# Patient Record
Sex: Male | Born: 1950 | Race: Black or African American | Hispanic: No | Smoking: Current every day smoker
Health system: Southern US, Community
[De-identification: ages and names within clinical notes are randomized; demographics above are authoritative.]

## PROBLEM LIST (undated history)

## (undated) DIAGNOSIS — F29 Unspecified psychosis not due to a substance or known physiological condition: Secondary | ICD-10-CM

## (undated) DIAGNOSIS — F0390 Unspecified dementia without behavioral disturbance: Secondary | ICD-10-CM

## (undated) DIAGNOSIS — G20A1 Parkinson's disease without dyskinesia, without mention of fluctuations: Secondary | ICD-10-CM

## (undated) DIAGNOSIS — G20 Parkinson's disease: Secondary | ICD-10-CM

## (undated) DIAGNOSIS — R609 Edema, unspecified: Secondary | ICD-10-CM

## (undated) DIAGNOSIS — F79 Unspecified intellectual disabilities: Secondary | ICD-10-CM

## (undated) DIAGNOSIS — E162 Hypoglycemia, unspecified: Secondary | ICD-10-CM

## (undated) DIAGNOSIS — N289 Disorder of kidney and ureter, unspecified: Secondary | ICD-10-CM

## (undated) DIAGNOSIS — I10 Essential (primary) hypertension: Secondary | ICD-10-CM

## (undated) DIAGNOSIS — L0390 Cellulitis, unspecified: Secondary | ICD-10-CM

## (undated) HISTORY — DX: Hypoglycemia, unspecified: E16.2

---

## 2001-06-12 ENCOUNTER — Encounter: Payer: Self-pay | Admitting: *Deleted

## 2001-06-12 ENCOUNTER — Inpatient Hospital Stay (HOSPITAL_COMMUNITY): Admission: EM | Admit: 2001-06-12 | Discharge: 2001-06-25 | Payer: Self-pay | Admitting: *Deleted

## 2004-09-20 ENCOUNTER — Ambulatory Visit: Payer: Self-pay | Admitting: Cardiology

## 2004-09-20 ENCOUNTER — Inpatient Hospital Stay (HOSPITAL_COMMUNITY): Admission: AD | Admit: 2004-09-20 | Discharge: 2004-09-24 | Payer: Self-pay | Admitting: Internal Medicine

## 2004-10-20 ENCOUNTER — Inpatient Hospital Stay (HOSPITAL_COMMUNITY): Admission: EM | Admit: 2004-10-20 | Discharge: 2004-10-30 | Payer: Self-pay | Admitting: *Deleted

## 2005-01-16 ENCOUNTER — Emergency Department (HOSPITAL_COMMUNITY): Admission: EM | Admit: 2005-01-16 | Discharge: 2005-01-16 | Payer: Self-pay | Admitting: Emergency Medicine

## 2005-01-28 ENCOUNTER — Emergency Department (HOSPITAL_COMMUNITY): Admission: EM | Admit: 2005-01-28 | Discharge: 2005-01-28 | Payer: Self-pay | Admitting: Emergency Medicine

## 2005-03-03 ENCOUNTER — Observation Stay (HOSPITAL_COMMUNITY): Admission: EM | Admit: 2005-03-03 | Discharge: 2005-03-05 | Payer: Self-pay | Admitting: Emergency Medicine

## 2005-04-21 ENCOUNTER — Observation Stay (HOSPITAL_COMMUNITY): Admission: EM | Admit: 2005-04-21 | Discharge: 2005-04-23 | Payer: Self-pay | Admitting: Emergency Medicine

## 2005-06-11 ENCOUNTER — Observation Stay (HOSPITAL_COMMUNITY): Admission: EM | Admit: 2005-06-11 | Discharge: 2005-06-12 | Payer: Self-pay | Admitting: Emergency Medicine

## 2005-10-29 ENCOUNTER — Inpatient Hospital Stay (HOSPITAL_COMMUNITY): Admission: EM | Admit: 2005-10-29 | Discharge: 2005-11-03 | Payer: Self-pay | Admitting: Emergency Medicine

## 2008-04-18 ENCOUNTER — Ambulatory Visit (HOSPITAL_COMMUNITY): Admission: RE | Admit: 2008-04-18 | Discharge: 2008-04-18 | Payer: Self-pay | Admitting: Oral Surgery

## 2009-06-01 ENCOUNTER — Ambulatory Visit: Payer: Self-pay | Admitting: Gastroenterology

## 2010-03-26 NOTE — Assessment & Plan Note (Signed)
Summary: BEHAVIOR PROBLEM, ?TCS   Visit Type:  Initial Consult Primary Care Provider:  Legrand Rams, M.D.  Chief Complaint:  ov to set up tcs.  History of Present Illness: Pt has an impaired ability to communicate. He is cared for at [facility]. Ms. Dwana Melena has accompanied him. He is a behavior problem and becomes belligerent and violent if he is unable to eat. The owner and Ms. Covington belive pt will not be able to take the prep. He has no vomiting, problems eating, weight loss, or rectal bleeding.  Current Medications (verified): 1)  Magnesium Oxide 500 Mg Tabs (Magnesium Oxide) .... Two Times A Day 2)  Aricept 10 Mg Tabs (Donepezil Hcl) .... Once Daily 3)  Buspirone Hcl 15 Mg Tabs (Buspirone Hcl) .... Two Times A Day 4)  Torsemide 20 Mg Tabs (Torsemide) .... 2 Two Times A Day 5)  Potassium 20 Meq .... 2 Two Times A Day 6)  Carbidopa-Levodopa 25-100 Mg Tabs (Carbidopa-Levodopa) .... 2 Three Times A Day 7)  Clozapine 100 Mg Tabs (Clozapine) .... 3 Once Daily 8)  Tylenol Extra Strength 500 Mg Tabs (Acetaminophen) .... As Needed  Allergies (verified): No Known Drug Allergies  Review of Systems       PER HPI OTHERWISE ALL SYSTEMS NEGATIVE.  Vital Signs:  Patient profile:   60 year old male Height:      68 inches Weight:      152 pounds BMI:     23.20 Temp:     97.9 degrees F oral Pulse rate:   76 / minute BP sitting:   120 / 84  (left arm) Cuff size:   regular  Vitals Entered By: Burnadette Peter LPN (April  8, 624THL 579FGE AM)  Physical Exam  General:  Well developed, well nourished, no acute distress. Head:  Atraumatic. Mouth:  No deformity or lesions, dentition poor. Neck:  Supple; no masses. Lungs:  Clear throughout to auscultation. Heart:  Regular rate and rhythm; no murmurs. Abdomen:  Soft, nontender and nondistended. Normal bowel sounds. Msk:  Symmetrical. Normal posture. Extremities:  No cyanosis, edema or deformities noted. Neurologic:  Alert and   interactive; has tremor and dyskinesia of of mouth otherwise grossly normal neurologically.  Impression & Recommendations:  Problem # 1:  SCREENING, COLON CANCER (ICD-V76.51) Assessment New  Pt will not be able to take the prep due to agitation/violent behavior that occurs when he is unable to eat what he wants. If he can't eat what he wants, then he will take trays from other clients. Pt cannot have a TCS without taking a prep. Add Benefiber 2 tsp daily. OPV as needed.  CC: PCP  Orders: New Patient Level I [id number])

## 2010-06-11 LAB — BASIC METABOLIC PANEL
BUN: 13 mg/dL (ref 6–23)
Calcium: 9.1 mg/dL (ref 8.4–10.5)
GFR calc non Af Amer: >60 mL/min (ref >60)
Glucose, Bld: 82 mg/dL (ref 70–99)
Potassium: 4.3 mEq/L (ref 3.5–5.1)

## 2010-06-11 LAB — CBC
HCT: 38.8 % — ABNORMAL LOW (ref 39.0–52.0)
Platelets: 226 10*3/uL (ref 150–400)
RDW: 14.8 % (ref 11.5–15.5)
WBC: 5.7 10*3/uL (ref 4.0–10.5)

## 2010-07-09 NOTE — Op Note (Signed)
[name], [name]          ACCOUNT NO.:  [id number]   MEDICAL RECORD NO.:  [id number]          PATIENT TYPE:  AMB   LOCATION:  SDS                          FACILITY:  [place] [facility]   PHYSICIAN:  Gae Bon, M.D.  DATE OF BIRTH:  [birth year]   DATE OF PROCEDURE:  DATE OF DISCHARGE:                               OPERATIVE REPORT   PREOPERATIVE DIAGNOSIS:  Multiple non-restorable teeth, #6, 7, 8, 9, 10,  11, 12, 13, 14, 15, 16, 22, 23, 24, 28, 29.   POSTOPERATIVE DIAGNOSIS:  Multiple non-restorable teeth, #6, 7, 8, 9,  10, 11, 12, 13, 14, 15, 16, 22, 23, 24, 28, 29.   PROCEDURE:  Removal of teeth #6, 7, 8, 9, 10, 11, 12, 13, 14, 15, 16,  22, 23, 24, 28, 29, alveoplasty maxillary right and left, and mandibular  anterior removal of exostoses upper right, upper left.   SURGEON:  Gae Bon, MD   ANESTHESIA:  General.   PROCEDURE:  The patient was taken to the operating room, placed on the  table in supine position.  General anesthesia was administered and oral  endotracheal tube was placed and secured.  The patient was draped for  the procedure.  Throat pack was placed.  Lidocaine 2% with 1:100,000  epinephrine was infiltrated in inferior alveolar block on the right and  left sides and in buccal and palatal infiltration in the maxilla total  of 18 mL was used, 15 blades were used to make a full-thickness incision  around teeth #22, 23, 24.  The periosteum was reflected.  Teeth were  removed with elevators and Ash forceps.  Sockets were curetted.  The  periosteum was further reflected and then alveoplasty was performed with  an egg bur.  The area was smoothed with a bone file, irrigated and  closed with 3-0 chromic.  Attention was then turned to the left maxilla.  Full-thickness incision was made along the necks of teeth #16 carrying  through #6.  Periosteum was reflected buccally and the teeth #15, 14,  13, 12, 11, 10, 9, 8, 7, 6 were removed using 301 elevator and a dental  forceps and rongeurs.  Tooth #16 was removed by removing bone overlying  the tooth and elevating with a 301 elevator.  Then, the periosteum was  further reflected and it was noted that there were several bony  exostoses approximately 3 x 3 mm on the buccal aspect of the bone on the  right and left maxilla.  These were smoothed along with an alveoplasty,  performed using an egg bur, then the area was smoothed with bone file  and irrigated and closed with 3-0 chromic.  The endotracheal tube was  repositioned and a bite block was repositioned.  Teeth # 28 and 29 were  removed using the 15 blade and dental rongeurs.  The socket was curetted  and closed with 3-0 chromic.  Then, the oral cavity was irrigated,  suctioned, and throat pack was removed.  Sponges were placed in the  mouth, the patient was awakened and taken to the recovery room in good  condition.   ESTIMATED BLOOD  LOSS:  Minimum.   COMPLICATIONS:  None.   SPECIMENS:  None.           ______________________________  Gae Bon, M.D.     SMJ/MEDQ  D:  04/18/2008  T:  04/18/2008  Job:  [id number]

## 2010-07-12 NOTE — Group Therapy Note (Signed)
NAMESEBASTAIN, ROEBKE NO.:  [id number]   MEDICAL RECORD NO.:  [id number]          PATIENT TYPE:  INP   LOCATION:  A313                          FACILITY:  [facility]   PHYSICIAN:  Estill Bamberg. Karie Kirks, M.D.DATE OF BIRTH:  Jul 05, 1950   DATE OF PROCEDURE:  11/01/2005  DATE OF DISCHARGE:                                   PROGRESS NOTE   SUBJECTIVE:  This 60 year old was admitted to the hospital with lymphedema  and cellulitis of the left leg. He also has a history of mental retardation  and Parkinson's disease, as well as psychosis.   OBJECTIVE:  He was supine in bed. Really was not answering questions. He is  in no acute distress. Well developed and thin.  VITAL SIGNS:  Temperature 97.3, pulse 68, respiratory rate 18, blood  pressure 114/78.  LUNGS:  Clear throughout but he was not moving air well.  HEART:  Regular rhythm with rate of 70.  ABDOMEN:  Soft.  EXTREMITIES:  He had obvious swelling of the left leg, extending just below  the left knee, down to the foot. There were 2 ulcers on the plantar surface  of the left foot and one area of skin breakdown on the anterior surface of  left lower leg, about 3 inches superior to the left ankle. There is mild  redness of that area as well.   ASSESSMENT:  1. Cellulitis of the left lower leg.  2. Chronic lymphedema of the left leg.  3. Mental retardation.  4. Parkinson's disease.  5. Psychosis.   PLAN:  Continue current treatment, which includes Sinemet t.i.d., Buspirone  15 mg b.i.d., Donepezil 10 mg daily,  enoxaparin 40 mg daily, __________ at  4 mg b.i.d. He is also on cephazolin 1 gram IV q.8 hours.      Estill Bamberg. Karie Kirks, M.D.  Electronically Signed     SDK/MEDQ  D:  11/01/2005  T:  11/02/2005  Job:  [id number]

## 2010-07-12 NOTE — Discharge Summary (Signed)
NAMECAANAN, VOIT          ACCOUNT NO.:  [id number]   MEDICAL RECORD NO.:  [id number]          PATIENT TYPE:  INP   LOCATION:  A313                          FACILITY:  [facility]   PHYSICIAN:  Tesfaye D. Legrand Rams, MD   DATE OF BIRTH:  [birth year]   DATE OF ADMISSION:  10/29/2005  DATE OF DISCHARGE:  09/10/2007LH                                 DISCHARGE SUMMARY   DISCHARGE DIAGNOSES:  1. Cellulitis of the left leg.  2. Prerenal azotemia.  3. Peripheral edema.  4. History of mental retardation.  5. Parkinson's disease.  6. History of psychosis.   DISCHARGE MEDICATIONS:  1. Augmentin 500 mg p.o. t.i.d. for seven days.  2. Magnesium oxide 400 mg b.i.d.  3. BuSpar 50 mg b.i.d.  4. __________ 10 mg daily.  5. Sinemet 25/100, one tablet p.o. t.i.d.  6. Clozaril 50 mg q.h.s.  7. Demadex 20 mg b.i.d.  8. KCl 20 mg p.o. b.i.d.  9. Tylenol 500 mg q.6 p.r.n.   DISPOSITION:  The patient to be discharged to a rest home.   HOSPITAL COURSE:  This is a 59 year old male patient with history of medical  illnesses. Was admitted due to pain and sweating of the left leg. The  patient was admitted as a case of acute cellulitis. He was treated with IV  antibiotics. The patient's condition improved. He was discharged back to  rest home on oral antibiotics.      Tesfaye D. Legrand Rams, MD  Electronically Signed     TDF/MEDQ  D:  12/03/2005  T:  12/04/2005  Job:  [id number]

## 2010-07-12 NOTE — Discharge Summary (Signed)
[facility]  Patient:    [name], [name] Visit Number: [id number] MRN: [id number]          Service Type: MED Location: 2A A211 01 Attending Physician:  Delorise Jackson Dictated by:   Irving Shows, M.D. Admit Date:  06/12/2001 Discharge Date: 06/25/2001                             Discharge Summary  DISCHARGE DIAGNOSES: 1. 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 2. Acute septicemia. 3. Schizophrenia. 4. Chronic tardive dyskinesia. 5. Chronic renal insufficiency.  SPECIAL PROCEDURE:  Wide excision and debridement of necrotizing fasciitis of left medial thigh and peroneum, June 12, 2001.  DISPOSITION:  The patient is discharged in stable and improved condition.  He will have a health follow up at the facility where he resides in and we will follow him up closely in the office.  DISCHARGE MEDICATIONS:  Cleocin 600 mg p.o. q.i.d. for two weeks, Keflex 500 mg q.i.d. for two weeks, BuSpar 15 mg t.i.d., Zyprexa 10 mg q.h.s., Benadryl 25 mg t.i.d., K-Dur 20 mEq b.i.d., furosemide 400 mg q.d., magnesium oxide 400 mg b.i.d.  Home health will follow him up in the nursing care facility.  They will flush the wound with saline and do wet-to-dry dressings, and change his dressings as needed.  FOLLOWUP:  He will have follow up in the office in two weeks.  SUMMARY:  A 60 year old male with a history of pain in his left thigh and groin for two days prior to admission.  He was noted to have a foul odor the day of admission and was brought to the emergency room for evaluation.  At the time of evaluation, he was noted to have an indurated mass of his left thigh and groin with extensive cellulitis extending from his groin along the medial aspect of his thigh down to his knee.  A CT scan was done that showed extensive subcutaneous gas collection compatible with necrotizing fasciitis and Fournier gangrene.  The patient was admitted with plans to have  wide excision under anesthesia.  History was positive for mental retardation, schizophrenia, chronic tardive dyskinesia.  Medications are given in the admission note.  The patient was admitted.  He was started on Cleocin, gentamicin, and Cefotan.  He was taken to the operating room on the evening of admission where an extensive necrosis of tissue down through the fascia and the muscle which started at the base of the penis and extended up to the inguinal ligament along to the anteromedial aspect of the upper thigh and posteriorly to the level of the rectum.  All necrotic fascia and muscle was excised.  No major vessels were exposed.  The area was flushed with Betadine and saline, and then packed with Betadine-soaked Kerlix gauze.  It was anticipated that he will need to have further debridement.  He was admitted to the ICU following surgery.  He did exceptionally well.  Postoperative white count was 15,200.  He did not have significant spike in fevers and his temperature curves trended downward.  He responded to treatment.  He was seen in consultation by Dr. Caryn Section for assistance in his adequate care. The cellulitis and edema of the lower thigh continued to improve.  He developed some diarrhea which was treated symptomatically.  He did not need further debridement.  There was no evidence of progression of sepsis.  There was decreased drainage from the wound.  There was subcutaneous crepitus that was noted.  He was allowed to ambulate and seemed to ambulate without difficulty.  He did well and was discharged on Jun 25, 2001, in awake and satisfactory condition. Dictated by:   Irving Shows, M.D. Attending Physician:  Delorise Jackson DD:  08/14/01 TD:  08/16/01 Job: [id number]

## 2010-07-12 NOTE — H&P (Signed)
[name], [name]          ACCOUNT NO.:  [id number]   MEDICAL RECORD NO.:  [id number]          PATIENT TYPE:  INP   LOCATION:  A313                          FACILITY:  [facility]   PHYSICIAN:  Tesfaye D. Legrand Rams, MD   DATE OF BIRTH:  03-13-1950   DATE OF ADMISSION:  10/29/2005  DATE OF DISCHARGE:  LH                                HISTORY & PHYSICAL   CHIEF COMPLAINT:  Pain and swelling of the left leg.   HISTORY OF PRESENT ILLNESS:  This is a 60 year old male patient who has a  history of multiple medical illnesses, who was brought from the rest home  due to the above complaint.  The patient is known to have chronic  lymphedema.  He has been on diuretics to help for the swelling.  His  swelling has been gradually decreasing.  The patient started having pain,  redness, and worsening of the swelling on the left leg.  He was brought to  the emergency room, where he was evaluated.  The patient was found to have  signs of cellulitis with leukocytosis.  Patient was started on IV  antibiotics and was admitted for further evaluation.   REVIEW OF SYSTEMS:  Patient has no headaches, chills, fever, cough,  shortness of breath, nausea, vomiting, abdominal pain, dysuria, urgency, or  frequency of urination.   PAST MEDICAL HISTORY:  1. Mental retardation.  2. Parkinson's disease.  3. History of psychosis.  4. Peripheral edema.  5. Hypertension.  6. Renal insufficiency.  7. History of hypokalemia.   CURRENT MEDICATIONS:  1. Aricept 10 mg p.o. daily.  2. BuSpar 50 mg p.o. daily.  3. Sinemet 25/100 1 tablet p.o. daily.  4. Demodex 40 mg p.o. b.i.d.  5. Clonazepam 100 mg 1/2 tablet p.o. at bedtime.  6. Acetaminophen 500 mg p.o. q.6h. p.r.n.  7. Magnesium oxide 400 mg b.i.d.  8. Potassium 20 mEq p.o. b.i.d.   SOCIAL HISTORY:  Patient is a resident of a local rest home.  patient smokes  cigarette . No history alcohol or substance abuse.   PHYSICAL EXAMINATION:  VITAL SIGNS:  Blood  pressure 112/75, pulse 109,  respiratory rate 16, temperature 101 degrees Fahrenheit.  GENERAL:  Patient is awake, alert, and chronically sick-looking.  HEENT:  Pupils are equal and reactive.  NECK:  Supple.  CHEST:  Clear lungs.  Good air entry.  CARDIOVASCULAR:  First and second heart sounds heard.  No murmur or gallop.  ABDOMEN:  Soft and lax.  Bowel sounds are full.  No mass.  No organomegaly.  EXTREMITIES:  Patient has redness and tenderness on his left leg.  There is  moderate swelling.   LABS:  WBC 17, hemoglobin 13.9, hematocrit 41.9, platelets 232.  Sodium 136,  potassium 4.1, chloride 95, carbon dioxide 31, glucose 150, BUN 50,  creatinine 2, calcium 8.5.   ASSESSMENT:  1. Cellulitis of the left leg.  2. Prerenal azotemia.  3. Peripheral edema.  4. History of mental retardation.  5. History of Parkinson's disease.  6. History of psychosis.   PLAN:  Will continue the patient on IV antibiotics.  Will  hold the  diuretics.  Will put the patient on DVT prophylaxis.  Will continue regular  treatment.      Tesfaye D. Legrand Rams, MD  Electronically Signed     TDF/MEDQ  D:  10/30/2005  T:  10/30/2005  Job:  [id number]

## 2010-07-12 NOTE — Op Note (Signed)
[facility]  Patient:    [name], [name] Visit Number: [id number] MRN: [id number]          Service Type: MED Location: ICCU [id number] Attending Physician:  Christiana Pellant Dictated by:   Irving Shows, M.D. Admit Date:  06/12/2001                             Operative Report  PREOPERATIVE DIAGNOSIS:  Necrotizing fasciitis, left medial thigh and groin.  POSTOPERATIVE DIAGNOSIS:  Necrotizing fasciitis, left medial thigh and groin.  PROCEDURE:  Wide excision and debridement of necrotizing fasciitis, left medial thigh, groin and perineum.  SURGEON:  Irving Shows, M.D.  ANESTHESIA:  Spinal anesthesia.  DESCRIPTION OF PROCEDURE:  Under spinal anesthesia, the left upper leg and perineum and genitalia were prepped and draped in a sterile field.  There were three openings which were draining purulent material.  Aerobic and anaerobic cultures were obtained.  Using electrocautery, wide excision of the necrotic area was carried out.  Finger fractionation was carried out along the tracts which extended down along the medial thigh, superiorly up to the inguinal ligament, posteriorly down to the perirectal space.  All of this tissue was excised.  Necrotic fascia and superficial muscle were also excised.  Once this was done, about the upper third of the thigh was excised.  The rectum was not violated.  No major vessels were exposed.  The area was flushed with saline. All tissue appeared to be viable and there did not appear to be any further tracting of the necrotic process.  The area was packed with Betadine-soaked six-inch Kerlix.  Dressing was placed.  He was transferred to a bed and taken to the ICU for postanesthetic monitoring. Dictated by:   Irving Shows, M.D. Attending Physician:  Christiana Pellant DD:  06/12/01 TD:  06/13/01 Job: [id number]

## 2010-07-12 NOTE — Consult Note (Signed)
NAMEZACHARY, [name]          ACCOUNT NO.:  [id number]   MEDICAL RECORD NO.:  [id number]          PATIENT TYPE:  INP   LOCATION:  A203                          FACILITY:  [facility]   PHYSICIAN:  Kofi A. Merlene Laughter, M.D. DATE OF BIRTH:  April 11, 1950   DATE OF CONSULTATION:  04/22/2005  DATE OF DISCHARGE:                                   CONSULTATION   HISTORY OF PRESENT ILLNESS:  This is a 60 year old African-American male who  has multiple medical problems.  He has been followed in our clinic for  cognitive impairment, mental retardation, and parkinsonism.  Per my  recollection he does not have a high cognitive level at baseline and is  significantly impaired, but is able to cooperate with activities of daily  living.  The patient apparently became more unresponsive, confused, and was  brought to the hospital for further evaluation.  So far evaluation has been  unrevealing.   PAST MEDICAL HISTORY:  1.  Significant for Parkinson's disease.  2.  Mental retardation.  3.  Chronic renal insufficiency.  4.  Peripheral edema.  5.  Hypokalemia.  6.  Psychosis.   MEDICATIONS ON ADMISSION:  1.  BuSpar.  2.  Aricept.  3.  Magnesium.  4.  Requip 3 mg daily.  5.  Xanax.  6.  Demodex.  7.  Ambien.  8.  Potassium chloride.   SOCIAL HISTORY:  The patient is single.  He stays at a rest home.  He is  disabled from his chronic medical illness.  No recent history of alcohol,  tobacco, or illicit drug use.   REVIEW OF SYSTEMS:  Unobtainable given the baseline cognitive impairment.   PHYSICAL EXAMINATION:  VITAL SIGNS:  Temperature 98.4, pulse 70,  respirations 20, blood pressure 80/47.  Previous blood pressure 106/65.  HEENT/NECK:  Shows neck is supple.  Head is normocephalic, atraumatic.  ABDOMEN:  Soft.  EXTREMITIES:  Show edema involving the legs bilaterally, more on the left  side.  MENTATION:  The patient is spontaneously awake and is watching television.  He is disoriented, however,  he states that he wants to go home and he knows  the usual location, although he is unable to say exactly where he is.  He  follows commands well bilaterally.  Speech shows significant hypophonia and  it is difficult to understand what he is trying to say.  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  Extraocular movements are full.  Visual  fields are intact.  Pupils are reactive and equal.  Facial movement is  symmetric.  Shoulder shrugs are normal.  Motor examination shows at least  antigravity strength and about 4+ strength in both upper and lower  extremities.  Coordination: marked global bradykinesia.  Reflexes are brisk.   LABORATORY DATA:  hemoglobin 12.7, platelet count of 175.  Sodium 141,  potassium 3.4, chloride 100, CO2 32, glucose 135, BUN 15, creatinine 1.3,  calcium 8.1.  Liver enzymes normal.  Thyroid function tests pending.  Urinalysis negative.  Blood culture:  No growth as of one day.   CT scan per the chart indicates no acute process, but  I did not see it in  the computer system.   IMPRESSION:  1.  Estimated cognitive impairment per my recollection, he actually appears      baseline at this time.  He may have improved since being admitted.  If      he did have a down turn and improved spontaneously that may suggest that      the patient may have had an unwitnessed seizure.  2.  The patient appears quite parkinsonian and this may contribute to his      empiric global functioning.  3.  Marked baseline cognitive impairment.   RECOMMENDATIONS:  1.  Imaging of the brain should be obtained if not done as yet.  2.  We will add sinemet to get better treatment of his parkinsonism.  He was      started on 25/100 b.i.d. and increased to t.i.d. after about three days.      The Requip may be continued, although this probably should be increased      gradually in the future.  3.  May be a recent EEG available.   Thanks for this consultation.      Kofi A. Merlene Laughter,  M.D.  Electronically Signed     KAD/MEDQ  D:  04/22/2005  T:  04/22/2005  Job:  [id number]

## 2010-07-12 NOTE — H&P (Signed)
[name], [name]          ACCOUNT NO.:  [id number]   MEDICAL RECORD NO.:  [id number]          PATIENT TYPE:  INP   LOCATION:  A341                          FACILITY:  [facility]   PHYSICIAN:  Tesfaye D. Legrand Rams, MD   DATE OF BIRTH:  1950-12-14   DATE OF ADMISSION:  10/20/2004  DATE OF DISCHARGE:  LH                                HISTORY & PHYSICAL   CHIEF COMPLAINT:  Change in mental status.   HISTORY OF PRESENT ILLNESS:  This is a 60 year old male patient with a  history of multiple medical illness who was a resident of a rest home  brought to the emergency room with the above complaints.  The patient was  recently discharged from this hospital after he was treated for anasarca of  unknown origin.  He was diuresed.  He was continued on oral diuretics.  However, the patient was brought to the emergency room due to change in  mental status and hypotension.  His blood test showed an elevated BUN and  creatinine.  His potassium was also very low.  The patient was admitted as a  case of change in mental status probably secondary to dehydration and  hypokalemia  The patient is started on IV fluids and potassium supplement.  The patient still remained lethargic.  He has no fever, chills, chest pain,  nausea or vomiting.   REVIEW OF SYSTEMS:  The patient is lethargic and unable to give any history  of.   PAST MEDICAL HISTORY:  1.  History of mental retardation.  2.  Parkinson's disease.  3.  Anasarca of unknown etiology.  4.  Hypertension.  5.  History of psychosis.   CURRENT MEDICATIONS:  1.  Demodex 40 mg p.o. b.i.d.  2.  KCl 40 mEq p.o. daily.  3.  Aricept 10 mg p.o. daily.  4.  Clonidine 0.1 mg p.o. daily.  5.  Requip 3 mg p.o. daily.  6.  Magnesium oxide 400 mg p.o. b.i.d.  7.  BuSpar 7.5 mg daily.   SOCIAL HISTORY:  The patient has been disabled due to his illness since  childhood.  He has been in a rest home for the last several years.  No  history of alcohol,  tobacco, or substance abuse.   PHYSICAL EXAMINATION:  GENERAL:  The patient is opening his eyes, but he is  confused, disoriented, and lethargic.  VITAL SIGNS:  Blood pressure 86/52, pulse 55, respiratory rate 16,  temperature 97.3 degrees Fahrenheit.  HEENT:  Pupils are equal and reactive.  NECK:  Supple.  CHEST:  Decreased air entry, rales and rhonchi.  CARDIOVASCULAR SYSTEM:  First and second heart sounds heard.  No murmurs, no  gallops.  ABDOMEN:  Soft and lax.  Bowel sounds are positive.  No masses, no  organomegaly.  EXTREMITIES:  1+ leg edema.   LABS:  ABGs pH 7.49, pCO2 of 56.9, pO2 of 394, bicarb 43, and saturation  100%.  This is on 100% oxygen.  CBC:  WBC 8.6, hemoglobin 14.9, hematocrit  44.7, and platelets 194.  PT 14.1, INR 1.1, and PTT 28.  Sodium 137,  potassium  2.0, chloride 79, CO2 45, glucose 129, BUN 80, creatinine 2.7,  albumin 1.6, alkaline phosphatase 56, AST 27, ALT 16, total protein 7.5,  albumin 4.4, and calcium 9.8.  CPK total 136, CK/MB 1.0, troponin 0.02.  Urinalysis:  Specific gravity 1.005, pH 5.0, albumin negative, glucose  negative, bacteria 0, and leukocytes negative.   ASSESSMENT:  1.  Prerenal azotemia secondary to dehydration as a result of diuretics.  2.  Change in mental status probably secondary to the above.  3.  History of mental retardation.  4.  History of Parkinson's disease.  5.  History of psychosis.  6.  Hypertension.  7.  Hypokalemia.   PLAN:  Will continue to rehydrate the patient with normal saline.  We will  replace his potassium.  Will do strict intake and output monitoring.  We  will repeat his BNP today and in the morning.  Will continue his __________  medications.      Tesfaye D. Legrand Rams, MD  Electronically Signed     TDF/MEDQ  D:  10/21/2004  T:  10/21/2004  Job:  [id number]

## 2010-07-12 NOTE — H&P (Signed)
[name], [name]          ACCOUNT NO.:  [id number]   MEDICAL RECORD NO.:  [id number]          PATIENT TYPE:  INP   LOCATION:  A302                          FACILITY:  [facility]   PHYSICIAN:  Tesfaye D. Legrand Rams, MD   DATE OF BIRTH:  January 13, 1951   DATE OF ADMISSION:  09/20/2004  DATE OF DISCHARGE:  LH                                HISTORY & PHYSICAL   CHIEF COMPLAINT:  Bilateral lower extremity swelling.   HISTORY OF PRESENT ILLNESS:  This is a 60 year old male patient with a  history of multiple medical illnesses who is a resident of a rest home  admitted with above complaint.  Patient had worsening lower extremity edema.  He was getting high dose diuretics.  Patient was on a combination of 40 mg  of Lasix and Zaroxolyn.  However, his swelling continued to get worse.  The  swelling has involved his lower extremity as well as his sacral area.  Patient was unable to ambulate due to the swelling.  Patient then admitted  for IV diuretics and further management.   REVIEW OF SYSTEMS:  Patient has mental retardation and unable to give any  history.   PAST MEDICAL HISTORY:  1.  Hypertension.  2.  History of chronic leg edema.  3.  Mental retardation.  4.  Parkinson's disease.  5.  History of psychosis.   CURRENT MEDICATIONS:  1.  Lasix 40 mg p.o. b.i.d.  2.  KCl 40 __________ p.o. once daily.  3.  Zaroxolyn 5 mg p.o. once daily.  4.  Aricept 5 mg p.o. once daily.  5.  Clonidine 0.5 mg p.o. once daily.  6.  BuSpar 7.5 mg p.o. once daily.  7.  Requip 3 mg p.o. t.i.d.   SOCIAL HISTORY:  Patient is a resident of rest home.  He is currently  disabled due to his illness.  He has no history of alcohol, tobacco, or  substance abuse.   PHYSICAL EXAMINATION:  GENERAL:  Patient is alert, awake, and chronically  sick looking.  VITALS:  Blood pressure 95/55, pulse 57, respiratory rate 16, temperature  97.6 degrees Fahrenheit.  HEENT:  Pupils are equal and reactive.  NECK:  Supple.  CHEST:  Spencer air entry.  Few rhonchi.  CARDIOVASCULAR SYSTEM:  First and second heart sound heart.  No murmur, no  gallop.  ABDOMEN:  Soft and lax.  Bowel sounds is positive.  No mass, no  organomegaly.  EXTREMITIES:  Extensive bilateral leg edema up to his thigh.  Patient also  has edema of his sacral area.   LABORATORIES ON ADMISSION:  WBC 7.5, hemoglobin 13.3, hematocrit 38.8,  platelets 214, sodium 134, potassium 3.1, chloride 95, carbon dioxide 32,  glucose 113, BUN 19, creatinine 1.4, bilirubin 0.9, alkaline phosphatase 46,  AST 28, ALT 18, total protein 6.1, albumin 3.6, calcium 8.9.  Specific  gravity 1.020, pH 5, wbc's negative, leukocytes negative, and nitrite  negative.   ASSESSMENT:  This is a 59 year old male patient with history of mental  retardation and Parkinson's disease who is currently a resident of a rest  home is demented due to extensive  swelling.  He has anasarca, the etiology  of which is not clear.   PLAN:  We will start patient on IV diuretics.  We will monitor his BMP.  We  will do intake and output.  We will monitor daily weight.  We will do  echocardiogram and evaluate his __________ .  We will continue patient on  his regular medications.       TDF/MEDQ  D:  09/20/2004  T:  09/20/2004  Job:  [id number]

## 2010-07-12 NOTE — Group Therapy Note (Signed)
[name], [name] NO.:  [id number]   MEDICAL RECORD NO.:  [id number]          PATIENT TYPE:  INP   LOCATION:  A313                          FACILITY:  [facility]   PHYSICIAN:  Estill Bamberg. Karie Kirks, M.D.DATE OF BIRTH:  1950/05/20   DATE OF PROCEDURE:  DATE OF DISCHARGE:                                   PROGRESS NOTE   SUBJECTIVE:  He feels about the same.   OBJECTIVE:  VITAL SIGNS:  Temperature 98.2.  Pulse 75.  Respiratory rate 20.  Blood pressure 99/61.  GENERAL:  He is thin, mid 48s, and really is not talking much.  Somewhat  recumbent in bed with a bag full of empty cigarette packs.  ABDOMEN:  Soft without hepatosplenomegaly or mass or tenderness.  EXTREMITIES:  He still has swelling and some redness in the distal left  lower leg and an area of skin breakdown anteriorly.  A pill-rolling tremor  was noted in the left hand.   ASSESSMENT:  1. Cellulitis of the leg.  2. Parkinson's disease.  3. Mental retardation.   PLAN:  Continue IV antibiotics and fluids.      Estill Bamberg. Karie Kirks, M.D.  Electronically Signed     SDK/MEDQ  D:  11/02/2005  T:  11/02/2005  Job:  [id number]

## 2010-07-12 NOTE — Discharge Summary (Signed)
[name], [name]          ACCOUNT NO.:  [id number]   MEDICAL RECORD NO.:  [id number]          PATIENT TYPE:  OBV   LOCATION:  A203                          FACILITY:  [facility]   PHYSICIAN:  Tesfaye D. Legrand Rams, MD   DATE OF BIRTH:  Jun 22, 1950   DATE OF ADMISSION:  04/21/2005  DATE OF DISCHARGE:  02/28/2007LH                                 DISCHARGE SUMMARY   DISCHARGE DIAGNOSES:  1.  Change in mental status, etiology not clear.  2.  History of mental retardation.  3.  Parkinson's disease.  4.  Peripheral edema.  5.  Renal insufficiency.  6.  Hypokalemia.   DISCHARGE MEDICATIONS:  1.  Sinemet 25/100, one tablet p.o. b.i.d. for 3 days and then at 1 tablet      p.o. t.i.d.  2.  __________ 3 mg daily.  3.  BuSpar 7.5 mg daily.  4.  Aricept 10 mg p.o. daily.  5.  Magnesium oxide 400 mg b.i.d.  6.  Ambien 5 mg at bedtime.  7.  Demodex 40 mg b.i.d.  8.  KCL 40 mg b.i.d.  9.  Xanax 0.5 mg p.o. q.h.s. for agitation.   HOSPITAL COURSE:  This is a 60 year old male patient with a history of  multiple medical illnesses who is a resident of a rest home admitted due to  a change in his mental status.  The patient has a mental retardation and  Parkinson's disease. His CT scan of the head that was done in the emergency  room did not show any new change.  The patient was evaluated by a  neurologist.  He was started on Sinemet.  His medications were adjusted.  The patient had some renal insufficiency and hypokalemia.  His potassium was  supplemented and he gradually improved.  The patient was at his baseline.  He was discharged to rest home and to continue his regular treatments.      Tesfaye D. Legrand Rams, MD  Electronically Signed     TDF/MEDQ  D:  06/03/2005  T:  06/03/2005  Job:  [id number]

## 2010-07-12 NOTE — H&P (Signed)
[name], [name]          ACCOUNT NO.:  [id number]   MEDICAL RECORD NO.:  [id number]          PATIENT TYPE:  INP   LOCATION:  A203                          FACILITY:  [facility]   PHYSICIAN:  Tesfaye D. Legrand Rams, MD   DATE OF BIRTH:  [birth year]   DATE OF ADMISSION:  04/21/2005  DATE OF DISCHARGE:  LH                                HISTORY & PHYSICAL   CHIEF COMPLAINT:  Change in mental status.   HISTORY OF PRESENT ILLNESS:  This is a 60 year old male patient with history  of multiple medical illnesses who was brought to the emergency room with  above complaints. The patient has history of mental retardation and  Parkinson's disease. However, patient was able to participate in his daily  living activities. He was currently in a local rest home. The last 24 hours,  the patient became more confused and unable to participate in his daily  living activity. He was brought to the emergency room where he was  evaluated. His CT scan of the head showed no acute change. However, the  patient was very confused and disoriented. The rest home was unable to  satisfy his care. The patient was admitted for further evaluation.   REVIEW OF SYSTEMS:  The patient is currently confused and disoriented,  unable to give any history.   PAST MEDICAL HISTORY:  1.  Mental retardation.  2.  Parkinson's disease.  3.  History of renal insufficiency secondary to dehydration.  4.  Peripheral edema.  5.  Hypokalemia.  6.  History of previous psychosis.   CURRENT MEDICATIONS:  1.  __________ 3 mg daily.  2.  BuSpar 7.5 mg p.o. daily.  3.  Aricept 10 mg p.o. daily.  4.  Magnesium oxide 400 mg p.o. b.i.d.  5.  Ambien 5 mg at bedtime.  6.  Demadex 40 mg b.i.d.  7.  KCl 40 mg b.i.d.  8.  Xanax 0.5 mg p.o. nightly.   SOCIAL HISTORY:  The patient is single. He is a resident of a rest home. The  patient has been disabled due to his illness. He has no history of alcohol,  tobacco or substance abuse.   PHYSICAL  EXAMINATION:  GENERAL:  The patient is alert, awake but confused  and disoriented with vitals:  Blood pressure 104/57, pulse 77, respiratory  rate 18, temperature 97.8 degree Fahrenheit.  HEENT:  Pupils are equal and reactive.  NECK:  Supple.  CHEST:  Decreased air entry. Few rhonchi.  CARDIOVASCULAR:  First and second heart sounds heard. No murmur. No gallop.  ABDOMEN:  Soft and lax. Bowel sounds are positive. No mass. No organomegaly.  EXTREMITIES:  3+ edema.   LABORATORY DATA:  CBC:  WBC 6.2, hemoglobin 12.7, hematocrit 36.6, platelets  175. Sodium 141, potassium 3.4, chloride 100, carbon 32, sodium 135, BUN 15,  creatinine 1.3; bilirubin 0.9, alkaline phosphatase 51, AST 30, ALT 14,  total protein 5.5, albumin 3.3, calcium 8.1, specific gravity 15, pH 6.0,  leukocytes negative, nitrates negative.   ASSESSMENT:  1.  Change in mental status, etiology unclear.  2.  History of mental retardation.  3.  Parkinson's disease.  4.  Peripheral edema.  5.  History of psychosis.  6.  History of renal insufficiency.  7.  History of recurrent hypokalemia.   PLAN:  Will do neurology consult. Will continue neuro check. Will continue  his regular medicines. Will possibly place patient in a nursing home as his  general condition is declining.      Tesfaye D. Legrand Rams, MD  Electronically Signed     TDF/MEDQ  D:  04/22/2005  T:  04/22/2005  Job:  [id number]

## 2010-07-12 NOTE — H&P (Signed)
NAME:  [name], [name]          ACCOUNT NO.:  [id number]   MEDICAL RECORD NO.:  [id number]          PATIENT TYPE:  INP   LOCATION:  A227                          FACILITY:  [facility]   PHYSICIAN:  Tesfaye D. Legrand Rams, MD   DATE OF BIRTH:  1950/04/24   DATE OF ADMISSION:  06/11/2005  DATE OF DISCHARGE:  LH                                HISTORY & PHYSICAL   CHIEF COMPLAINT:  Agitation, change in mental status.   HISTORY OF PRESENT ILLNESS:  This is a 60 year old male patient who has  history of multiple medical illnesses who was a resident in local rest home  who was brought to emergency room due to severe agitation and aggression  against rest home staff.  Patient was very violent and was unable to be  controlled.  EMS was called and patient was brought to emergency room.  He  was given a dose of Haldol.  He was then admitted for further evaluation.  Patient is a known case of mental retardation and with a history of some  psychotic behavior in the past.  He used to be on anti-psychotic medication.  However, he developed Parkinson's like symptoms.  He was discontinued from  anti-psychotic medication and was evaluated by a neurologist and was started  on anti-Parkinson's medications.   REVIEW OF SYSTEMS:  Patient is somewhat confused and disoriented and  uncooperative to give any further history.   PAST MEDICAL HISTORY:  1.  Mental retardation.  2.  Parkinson's disease.  3.  Peripheral edema.  4.  Hypertension.  5.  Renal insufficiency.  6.  History of hypokalemia.   CURRENT MEDICATIONS:  1.  BuSpar half tablet p.o. daily.  2.  Aricept 10 mg p.o. daily.  3.  Magnesium oxide 400 mg b.i.d.  4.  Xanax 0.5 mg t.i.d.  5.  Demadex 40 mg b.i.d.  6.  KCl 40 mg p.o. daily.  7.  Requip three tablets p.o. daily.  8.  Sinemet 25/100 one tablet p.o. q.i.d.   SOCIAL HISTORY:  Patient is currently a resident of rest home.  He is  disabled due to his illness.   PHYSICAL EXAMINATION:   GENERAL:  Patient is alert, awake, confused, and  somewhat agitated.  VITAL SIGNS:  Blood pressure 112/57, pulse 84, respiratory rate 20,  temperature 98.7 degrees Fahrenheit.  HEENT:  Pupils are equal, reactive.  NECK:  Supple.  CHEST:  Poor air entry.  Bilateral rhonchi.  CARDIOVASCULAR:  First and second heart sounds heard.  No murmur.  No  gallop.  ABDOMEN:  Soft and lax.  Bowel sound is positive.  No mass.  No  organomegaly.  EXTREMITIES:  2+ leg edema.   ADMISSION LABORATORIES:  WBC 6.5, hemoglobin 13.9, hematocrit 42.3, platelet  187.  Sodium 144, potassium 3.9, chloride 103, carbon dioxide 36, glucose  117, BUN 22, creatinine 1.6, calcium 9.7.   ASSESSMENT:  1.  Mental retardation with psychotic behavior.  2.  History of Parkinson's disease.  3.  Dementia.  4.  Peripheral edema.  5.  History of renal insufficiency.  6.  Hypertension.   PLAN:  Will  continue patient on p.r.n. Haldol.  Will get behavioral health  for evaluation.  Will continue his medications and will do social service  evaluation for further placement.      Tesfaye D. Legrand Rams, MD  Electronically Signed     TDF/MEDQ  D:  06/12/2005  T:  06/12/2005  Job:  [id number]

## 2010-07-12 NOTE — H&P (Signed)
[name], [name]          ACCOUNT NO.:  [id number]   MEDICAL RECORD NO.:  [id number]          PATIENT TYPE:  INP   LOCATION:  A215                          FACILITY:  [facility]   PHYSICIAN:  Tesfaye D. Legrand Rams, MD   DATE OF BIRTH:  01-27-1951   DATE OF ADMISSION:  03/03/2005  DATE OF DISCHARGE:  LH                                HISTORY & PHYSICAL   CHIEF COMPLAINT:  Low potassium level.   HISTORY OF PRESENT ILLNESS:  This is a 60 year old male patient with history  of multiple medical illnesses who was a resident of local rest home who was  found to have a potassium of 2.1 on routine lab tests. He was sent to  emergency room, and his potassium was repeated. The patient was on high dose  of oral Demadex for peripheral edema. The patient was then started on IV  potassium and was admitted to continue his potassium replacement. The  patient is on 24 hours of observation. The patient has no specific  complaints.   REVIEW OF SYSTEMS:  No fever, chills, chest pain, heart palpitations,  nausea, vomiting, abdominal pain, dysuria, urgency or frequency of  urination.   PAST MEDICAL HISTORY:  1.  Peripheral edema.  2.  Mental retardation.  3.  Hypertension.  4.  Parkinson's disease.  5.  History of renal insufficiency.  6.  History of recurrent hypokalemia.  7.  History of psychosis.   CURRENT MEDICATIONS:  1.  Demadex 40 mg p.o. b.i.d.  2.  KCl 40 mEq b.i.d.  3.  Magnesium oxide 400 mg p.o. b.i.d.  4.  Requip 3 mg p.o. daily.  5.  Aricept 10 mg p.o. daily.   SOCIAL HISTORY:  The patient is single. No history of alcohol, tobacco, or  substance abuse.   PHYSICAL EXAMINATION:  GENERAL:  The patient is alert, awake and resting.  VITAL SIGNS:  Blood pressure 116/62, pulse 70, respiratory rate 20,  temperature 98 degrees Fahrenheit.  HEENT:  Pupils are equal and reactive.  NECK:  Supple.  CHEST:  Clear.  LUNGS:  With good air entry.  CARDIOVASCULAR:  First and second heart sounds  heard. No murmur. No gallop.  ABDOMEN:  Soft and lax. Bowel sounds are positive. No mass. No organomegaly.  EXTREMITIES:  No leg edema.   LABORATORY DATA:  CBC:  WBC 6.9, hemoglobin 14.1, hematocrit 42.0, platelets  212. Sodium 140, potassium 2.2, chloride 85, carbon dioxide 46, glucose 129,  BUN 32, creatinine 1.5.   ASSESSMENT:  1.  Severe hypokalemia.  2.  History of peripheral edema.  3.  Renal insufficiency due to diuretics.  4.  History of hypertension.  5.  Mental retardation.  6.  History of psychosis.  7.  History of Parkinson's disease.   PLAN:  Will supplement potassium IV. Will hold his diuretics at this time.  Will continue patient on his regular medications.      Tesfaye D. Legrand Rams, MD  Electronically Signed     TDF/MEDQ  D:  03/04/2005  T:  03/04/2005  Job:  [id number]

## 2010-07-12 NOTE — Discharge Summary (Signed)
[name], [name]          ACCOUNT NO.:  [id number]   MEDICAL RECORD NO.:  [id number]          PATIENT TYPE:  INP   LOCATION:  A302                          FACILITY:  [facility]   PHYSICIAN:  Tesfaye D. Legrand Rams, MD   DATE OF BIRTH:  Jun 23, 1950   DATE OF ADMISSION:  09/20/2004  DATE OF DISCHARGE:  08/01/2006LH                                 DISCHARGE SUMMARY   DISCHARGE DIAGNOSES:  1.  Anasarca, etiology unclear.  2.  Hypertension.  3.  History of chronic leg edema.  4.  Mental retardation.  5.  Parkinson's disease.  6.  History of psychosis.   DISCHARGE MEDICATIONS:  1.  Demodex 40 mg p.o. b.i.d.  2.  Kay Ciel 40 mEq p.o. daily.  3.  Aricept 10 mg p.o. daily.  4.  Clonidine 0.1 mg daily.  5.  BuSpar 7.5 mg daily.  6.  Requip 3 mg daily.  7.  Magnesium oxide 400 mg p.o. b.i.d.   DISPOSITION:  The patient is discharged back to rest home.   HOSPITAL COURSE:  This is a 60 year old male patient who has a history of  multiple medical illnesses and a resident of [facility] brought  in due to bilateral lower extremity swelling.  The patient had history of  chronic leg edema, however, he has been on high dose of steroids and started  swelling all over his lower extremity extending to his thigh and sacral  area.  The patient was admitted as a case of anasarca.  There was no known  etiology for his anasarca.  His labs were negative for proteinuria and liver  disease.  His echocardiogram was within normal limits.  The patient was  treated with IV diuretics.  Over the hospital stay, the patient diuresed  well and his anasarca improved.  The patient was discharged back to continue  oral diuretics.      Tesfaye D. Legrand Rams, MD  Electronically Signed     TDF/MEDQ  D:  10/18/2004  T:  10/18/2004  Job:  [id number]

## 2010-07-12 NOTE — Procedures (Signed)
[name], [name] NO.:  [id number]   MEDICAL RECORD NO.:  [id number]          PATIENT TYPE:  INP   LOCATION:  A302                          FACILITY:  [facility]   PHYSICIAN:  Jacqulyn Ducking, M.D.  DATE OF BIRTH:  06-Aug-1950   DATE OF PROCEDURE:  09/23/2004  DATE OF DISCHARGE:                                  ECHOCARDIOGRAM   REFERRING PHYSICIAN:  Tesfaye D. Legrand Rams, MD   CLINICAL DATA:  A 60 year old;  anasarca.   FINDINGS:  1.  Technically difficult and somewhat suboptimal echocardiographic study.  2.  Normal left and right atria; the right ventricle not ideally imaged,      appears normal in size and function.  Borderline right ventricular      hypertrophy present.  3.  Aortic valve not well seen.  No stenosis apparent.  4.  Normal mitral valve; annular calcification present.  5.  Normal tricuspid valve, pulmonic valve and proximal pulmonary artery.  6.  Normal left ventricular size; mild hypertrophy; not all segments      adequately imaged; dyssynchronous septal motion with low normal overall      left ventricular systolic function.  7.  Mild dilatation of the inferior vena cava (IVC); diameter decreases      normally with inspiration.       RR/MEDQ  D:  09/23/2004  T:  09/24/2004  Job:  [id number]

## 2010-07-12 NOTE — Discharge Summary (Signed)
NAMEIMHOTEP, SCORE          ACCOUNT NO.:  [id number]   MEDICAL RECORD NO.:  [id number]          PATIENT TYPE:  INP   LOCATION:  A341                          FACILITY:  [facility]   PHYSICIAN:  Tesfaye D. Legrand Rams, MD   DATE OF BIRTH:  1950-10-16   DATE OF ADMISSION:  10/20/2004  DATE OF DISCHARGE:  09/06/2006LH                                 DISCHARGE SUMMARY   DISCHARGE DIAGNOSES:  1.  Renal insufficiency secondary to dehydration.  2.  Change in mental status secondary to above.  3.  History of mental retardation.  4.  History of Parkinson disease.  5.  History of psychosis.  6.  Hypokalemia.  7.  Hypertension.  8.  History of peripheral edema and anasarca.   DISCHARGE MEDICATIONS:  1.  Demadex 40 mg p.o. daily.  2.  Kay Ciel 40 mEq p.o. daily.  3.  Aricept 10 mg p.o. daily.  4.  Buspar 7.5 mg p.o. daily.  5.  Magnesium oxide 400 mg p.o. b.i.d.  6.  Requip 3 mg p.o. daily.   DISPOSITION:  The patient will be discharged to [facility].   HOSPITAL COURSE:  This is a 60 year old male patient who has a history of  multiple medical illnesses who was admitted from rest home due to change in  mental status and renal insufficiency. The patient had severe hypokalemia.  The patient was previously on high dose diuretics for peripheral edema. He  was admitted and started on IV fluids. The patient also received  supplemental potassium. His renal function and his potassium were corrected.  The patient came back to his baseline. Due to decline in his general  condition, however, he is going to be placed in a nursing home. The patient  is currently receiving low dose of diuretics. Will continue to monitor his  electrolytes. The patient will be discharged to [facility] in  stable condition.      Tesfaye D. Legrand Rams, MD  Electronically Signed     TDF/MEDQ  D:  10/30/2004  T:  10/30/2004  Job:  [id number]

## 2011-09-12 ENCOUNTER — Inpatient Hospital Stay (HOSPITAL_COMMUNITY)
Admission: EM | Admit: 2011-09-12 | Discharge: 2011-09-18 | DRG: 603 | Payer: Medicare Other | Attending: Internal Medicine | Admitting: Internal Medicine

## 2011-09-12 ENCOUNTER — Emergency Department (HOSPITAL_COMMUNITY): Payer: Medicare Other

## 2011-09-12 ENCOUNTER — Encounter (HOSPITAL_COMMUNITY): Payer: Self-pay

## 2011-09-12 DIAGNOSIS — L03119 Cellulitis of unspecified part of limb: Secondary | ICD-10-CM

## 2011-09-12 DIAGNOSIS — Z452 Encounter for adjustment and management of vascular access device: Secondary | ICD-10-CM | POA: Diagnosis not present

## 2011-09-12 DIAGNOSIS — M7989 Other specified soft tissue disorders: Secondary | ICD-10-CM | POA: Diagnosis not present

## 2011-09-12 DIAGNOSIS — F79 Unspecified intellectual disabilities: Secondary | ICD-10-CM | POA: Diagnosis not present

## 2011-09-12 DIAGNOSIS — R509 Fever, unspecified: Secondary | ICD-10-CM | POA: Diagnosis not present

## 2011-09-12 DIAGNOSIS — N189 Chronic kidney disease, unspecified: Secondary | ICD-10-CM | POA: Diagnosis not present

## 2011-09-12 DIAGNOSIS — I129 Hypertensive chronic kidney disease with stage 1 through stage 4 chronic kidney disease, or unspecified chronic kidney disease: Secondary | ICD-10-CM | POA: Diagnosis present

## 2011-09-12 DIAGNOSIS — L02419 Cutaneous abscess of limb, unspecified: Principal | ICD-10-CM | POA: Diagnosis present

## 2011-09-12 DIAGNOSIS — G20 Parkinson's disease: Secondary | ICD-10-CM | POA: Diagnosis not present

## 2011-09-12 DIAGNOSIS — G20A1 Parkinson's disease without dyskinesia, without mention of fluctuations: Secondary | ICD-10-CM | POA: Diagnosis present

## 2011-09-12 DIAGNOSIS — E860 Dehydration: Secondary | ICD-10-CM | POA: Diagnosis not present

## 2011-09-12 DIAGNOSIS — F17200 Nicotine dependence, unspecified, uncomplicated: Secondary | ICD-10-CM | POA: Diagnosis present

## 2011-09-12 HISTORY — DX: Unspecified intellectual disabilities: F79

## 2011-09-12 HISTORY — DX: Parkinson's disease: G20

## 2011-09-12 HISTORY — DX: Parkinson's disease without dyskinesia, without mention of fluctuations: G20.A1

## 2011-09-12 HISTORY — DX: Unspecified psychosis not due to a substance or known physiological condition: F29

## 2011-09-12 HISTORY — DX: Unspecified dementia, unspecified severity, without behavioral disturbance, psychotic disturbance, mood disturbance, and anxiety: F03.90

## 2011-09-12 HISTORY — DX: Essential (primary) hypertension: I10

## 2011-09-12 HISTORY — DX: Cellulitis, unspecified: L03.90

## 2011-09-12 HISTORY — DX: Edema, unspecified: R60.9

## 2011-09-12 HISTORY — DX: Disorder of kidney and ureter, unspecified: N28.9

## 2011-09-12 LAB — CBC
HCT: 40.8 % (ref 39.0–52.0)
Hemoglobin: 13 g/dL (ref 13.0–17.0)
MCH: 28 pg (ref 26.0–34.0)
MCHC: 31.9 g/dL (ref 30.0–36.0)
MCV: 87.9 fL (ref 78.0–100.0)
RBC: 4.64 MIL/uL (ref 4.22–5.81)

## 2011-09-12 LAB — COMPREHENSIVE METABOLIC PANEL
Albumin: 3.4 g/dL — ABNORMAL LOW (ref 3.5–5.2)
Alkaline Phosphatase: 62 U/L (ref 39–117)
BUN: 29 mg/dL — ABNORMAL HIGH (ref 6–23)
Creatinine, Ser: 1.95 mg/dL — ABNORMAL HIGH (ref 0.50–1.35)
GFR calc Af Amer: 41 mL/min — ABNORMAL LOW (ref >90)
Glucose, Bld: 100 mg/dL — ABNORMAL HIGH (ref 70–99)
Potassium: 4 mEq/L (ref 3.5–5.1)
Total Protein: 6.6 g/dL (ref 6.0–8.3)

## 2011-09-12 LAB — DIFFERENTIAL
Basophils Relative: 0 % (ref 0–1)
Eosinophils Absolute: 0 10*3/uL (ref 0.0–0.7)
Eosinophils Relative: 0 % (ref 0–5)
Lymphs Abs: 0.8 10*3/uL (ref 0.7–4.0)
Monocytes Absolute: 0.7 10*3/uL (ref 0.1–1.0)
Monocytes Relative: 5 % (ref 3–12)
Neutrophils Relative %: 90 % — ABNORMAL HIGH (ref 43–77)

## 2011-09-12 LAB — URINALYSIS, ROUTINE W REFLEX MICROSCOPIC
Bilirubin Urine: NEGATIVE
Nitrite: NEGATIVE
Urobilinogen, UA: 0.2 mg/dL (ref 0.0–1.0)

## 2011-09-12 LAB — URINE MICROSCOPIC-ADD ON

## 2011-09-12 LAB — PROCALCITONIN: Procalcitonin: 21.57 ng/mL

## 2011-09-12 LAB — LACTIC ACID, PLASMA: Lactic Acid, Venous: 1.2 mmol/L (ref 0.5–2.2)

## 2011-09-12 MED ORDER — MAGNESIUM OXIDE 400 (241.3 MG) MG PO TABS
400.0000 mg | ORAL_TABLET | Freq: Two times a day (BID) | ORAL | Status: DC
  Administered 2011-09-13 – 2011-09-18 (×11): 400 mg via ORAL
  Filled 2011-09-12 (×12): qty 1

## 2011-09-12 MED ORDER — VANCOMYCIN HCL IN DEXTROSE 1-5 GM/200ML-% IV SOLN
1000.0000 mg | Freq: Once | INTRAVENOUS | Status: AC
  Administered 2011-09-12: 1000 mg via INTRAVENOUS
  Filled 2011-09-12: qty 200

## 2011-09-12 MED ORDER — ACETAMINOPHEN 500 MG PO TABS
ORAL_TABLET | ORAL | Status: AC
  Filled 2011-09-12: qty 2

## 2011-09-12 MED ORDER — ENOXAPARIN SODIUM 80 MG/0.8ML ~~LOC~~ SOLN
1.0000 mg/kg | Freq: Once | SUBCUTANEOUS | Status: AC
  Administered 2011-09-12: 80 mg via SUBCUTANEOUS
  Filled 2011-09-12: qty 0.8

## 2011-09-12 MED ORDER — SODIUM CHLORIDE 0.9 % IV SOLN
1000.0000 mL | INTRAVENOUS | Status: DC
  Administered 2011-09-12: 1000 mL via INTRAVENOUS

## 2011-09-12 MED ORDER — CLOZAPINE 100 MG PO TABS
100.0000 mg | ORAL_TABLET | Freq: Every day | ORAL | Status: DC
  Administered 2011-09-13 – 2011-09-17 (×5): 100 mg via ORAL
  Filled 2011-09-12 (×8): qty 1

## 2011-09-12 MED ORDER — ENOXAPARIN SODIUM 80 MG/0.8ML ~~LOC~~ SOLN
1.0000 mg/kg | Freq: Two times a day (BID) | SUBCUTANEOUS | Status: DC
  Administered 2011-09-13 – 2011-09-14 (×3): 75 mg via SUBCUTANEOUS
  Filled 2011-09-12 (×3): qty 0.8

## 2011-09-12 MED ORDER — SODIUM CHLORIDE 0.9 % IV SOLN
1000.0000 mL | Freq: Once | INTRAVENOUS | Status: AC
  Administered 2011-09-12: 1000 mL via INTRAVENOUS

## 2011-09-12 MED ORDER — CARBIDOPA-LEVODOPA 25-100 MG PO TABS
2.0000 | ORAL_TABLET | Freq: Four times a day (QID) | ORAL | Status: DC
  Administered 2011-09-13 – 2011-09-18 (×21): 2 via ORAL
  Filled 2011-09-12 (×21): qty 2

## 2011-09-12 MED ORDER — CLONAZEPAM 0.5 MG PO TABS
0.5000 mg | ORAL_TABLET | Freq: Every day | ORAL | Status: DC | PRN
  Administered 2011-09-15 – 2011-09-17 (×3): 0.5 mg via ORAL
  Filled 2011-09-12 (×3): qty 1

## 2011-09-12 MED ORDER — TORSEMIDE 20 MG PO TABS
40.0000 mg | ORAL_TABLET | Freq: Two times a day (BID) | ORAL | Status: DC
  Administered 2011-09-13 – 2011-09-18 (×11): 40 mg via ORAL
  Filled 2011-09-12 (×11): qty 2

## 2011-09-12 MED ORDER — ACETAMINOPHEN 500 MG PO TABS
1000.0000 mg | ORAL_TABLET | Freq: Once | ORAL | Status: AC
  Administered 2011-09-12: 1000 mg via ORAL

## 2011-09-12 MED ORDER — POTASSIUM CHLORIDE CRYS ER 20 MEQ PO TBCR
40.0000 meq | EXTENDED_RELEASE_TABLET | Freq: Two times a day (BID) | ORAL | Status: DC
  Administered 2011-09-13 – 2011-09-18 (×11): 40 meq via ORAL
  Filled 2011-09-12 (×11): qty 2

## 2011-09-12 MED ORDER — DONEPEZIL HCL 5 MG PO TABS
10.0000 mg | ORAL_TABLET | Freq: Every day | ORAL | Status: DC
  Administered 2011-09-13 – 2011-09-18 (×6): 10 mg via ORAL
  Filled 2011-09-12 (×6): qty 2

## 2011-09-12 MED ORDER — PSYLLIUM 95 % PO PACK
1.0000 | PACK | Freq: Every day | ORAL | Status: DC
  Administered 2011-09-13 – 2011-09-18 (×6): 1 via ORAL
  Filled 2011-09-12 (×7): qty 1

## 2011-09-12 MED ORDER — BUSPIRONE HCL 5 MG PO TABS
15.0000 mg | ORAL_TABLET | Freq: Two times a day (BID) | ORAL | Status: DC
  Administered 2011-09-13 – 2011-09-18 (×11): 15 mg via ORAL
  Filled 2011-09-12 (×11): qty 3

## 2011-09-12 NOTE — Progress Notes (Signed)
Patient refused all 2200 medications

## 2011-09-12 NOTE — ED Provider Notes (Signed)
History   Level 5 Caveat - Unable to obtain complete HPI and ROS due to MR and dementia.  This chart was scribed for Richarda Blade, MD by Roe Coombs. The patient was seen in room APA18/APA18. Patient's care was started at 1517.     CSN: [id number]  Arrival date & time 09/12/11  1517   First MD Initiated Contact with Patient 09/12/11 1533      Chief Complaint  Patient presents with  . Altered Mental Status  . Fever    (Consider location/radiation/quality/duration/timing/severity/associated sxs/prior treatment) The history is provided by a caregiver and the nursing home. No language interpreter was used.   [name] is a 61 y.o. male who presents to the Emergency Department complaining of AMS 3.5 hours ago per staff at group home. Group home staff stated that they noticed patient was having difficulty walking and presented with altered mental status around 12pm. Other symptoms include fever. Patient with medical h/o Parksinson's disease, mental retardation, renal insufficiency, dementia, psychosis, HTN, cellulitis and edema.  Past Medical History  Diagnosis Date  . Mental retardation   . Renal insufficiency   . Parkinson's disease   . Dementia   . Psychosis   . Hypertension   . Cellulitis   . Edema     History reviewed. No pertinent past surgical history.  No family history on file.  History  Substance Use Topics  . Smoking status: Never Smoker   . Smokeless tobacco: Not on file  . Alcohol Use: No      Review of Systems  Unable to perform ROS   Allergies  Review of patient's allergies indicates no known allergies.  Home Medications   Current Outpatient Rx  Name Route Sig Dispense Refill  . ACETAMINOPHEN 500 MG PO TABS Oral Take 500 mg by mouth every 6 (six) hours as needed. For pain    . BUSPIRONE HCL 15 MG PO TABS Oral Take 15 mg by mouth 2 (two) times daily.    Marland Kitchen CARBIDOPA-LEVODOPA 25-100 MG PO TABS Oral Take 2 tablets by mouth 4 (four)  times daily.    Marland Kitchen CLONAZEPAM 0.5 MG PO TABS Oral Take 0.5 mg by mouth daily as needed. For agitation    . CLOZAPINE 100 MG PO TABS Oral Take 100 mg by mouth at bedtime.    . DONEPEZIL HCL 10 MG PO TABS Oral Take 10 mg by mouth daily.    Marland Kitchen MAGNESIUM OXIDE 400 MG PO TABS Oral Take 400 mg by mouth 2 (two) times daily.    Marland Kitchen POTASSIUM CHLORIDE CRYS ER 20 MEQ PO TBCR Oral Take 40 mEq by mouth 2 (two) times daily.    . PSYLLIUM 0.52 G PO CAPS Oral Take 1.04 g by mouth every morning.    . TORSEMIDE 20 MG PO TABS Oral Take 40 mg by mouth 2 (two) times daily.      BP 111/62  Pulse 94  Temp 102.5 F (39.2 C) (Oral)  Resp 25  Wt 145 lb (65.772 kg)  SpO2 95%  Physical Exam  Nursing note and vitals reviewed. Constitutional: He appears well-developed. No distress.  HENT:  Mouth/Throat: Mucous membranes are dry.       No facial asymmetry. Dry mucous membranes.  Eyes:       Left eye esotropia.  Neck: Neck supple. No tracheal deviation present.  Cardiovascular: Normal rate, regular rhythm and normal heart sounds.   Pulmonary/Chest: Effort normal and breath sounds normal. No respiratory distress.  Abdominal:  Soft. Bowel sounds are normal. He exhibits no distension. There is no tenderness.  Musculoskeletal: Normal range of motion.       Cervical back: He exhibits no tenderness, no swelling, no edema and no deformity.       Thoracic back: He exhibits no tenderness, no swelling, no edema and no deformity.       Lumbar back: He exhibits no tenderness, no swelling, no edema and no deformity.       Right lower leg: He exhibits tenderness (Mildly) and swelling (Knees to toes.).       Equal hand grips and leg strength.  Right leg swollen from knee to toes. Generally red and mildly tender.  Neurological: No sensory deficit.  Skin: Skin is dry.    ED Course  Procedures (including critical care time) DIAGNOSTIC STUDIES: Oxygen Saturation is 95% on room air, normal by my interpretation.     COORDINATION OF CARE: 3:36PM- Considered for CVA. No evidence of acute stroke. Will administer fluid bolus and Tylenol. Will order chest-X-ray, urine tests, blood culture, CBC, and metabolic panel.  [id number]- Completed physical exam.    Date: 09/12/2011  Rate: 95  Rhythm: normal sinus rhythm  QRS Axis: normal  Intervals: short PR (106)  ST/T Wave abnormalities: normal  Conduction Disutrbances:none  Narrative Interpretation:   Old EKG Reviewed: none available   Patient treated for cellulitis with vancomycin, IV, and possible DVT, with Lovenox subcutaneously.  Admission arranged and holding orders are written  Results for orders placed during the hospital encounter of 09/12/11  CULTURE, BLOOD (ROUTINE X 2)      Component Value Range   Specimen Description BLOOD RIGHT ARM     Special Requests BOTTLES DRAWN AEROBIC AND ANAEROBIC 8CC     Culture PENDING     Report Status PENDING    CULTURE, BLOOD (ROUTINE X 2)      Component Value Range   Specimen Description BLOOD RIGHT ARM     Special Requests BOTTLES DRAWN AEROBIC AND ANAEROBIC [place]     Culture PENDING     Report Status PENDING    CBC      Component Value Range   WBC 14.4 (*) 4.0 - 10.5 K/uL   RBC 4.64  4.22 - 5.81 MIL/uL   Hemoglobin 13.0  13.0 - 17.0 g/dL   HCT 40.8  39.0 - 52.0 %   MCV 87.9  78.0 - 100.0 fL   MCH 28.0  26.0 - 34.0 pg   MCHC 31.9  30.0 - 36.0 g/dL   RDW 14.7  11.5 - 15.5 %   Platelets 152  150 - 400 K/uL  DIFFERENTIAL      Component Value Range   Neutrophils Relative 90 (*) 43 - 77 %   Neutro Abs 12.9 (*) 1.7 - 7.7 K/uL   Lymphocytes Relative 6 (*) 12 - 46 %   Lymphs Abs 0.8  0.7 - 4.0 K/uL   Monocytes Relative 5  3 - 12 %   Monocytes Absolute 0.7  0.1 - 1.0 K/uL   Eosinophils Relative 0  0 - 5 %   Eosinophils Absolute 0.0  0.0 - 0.7 K/uL   Basophils Relative 0  0 - 1 %   Basophils Absolute 0.0  0.0 - 0.1 K/uL  COMPREHENSIVE METABOLIC PANEL      Component Value Range   Sodium 134 (*) 135 -  145 mEq/L   Potassium 4.0  3.5 - 5.1 mEq/L   Chloride 95 (*) 96 -  112 mEq/L   CO2 30  19 - 32 mEq/L   Glucose, Bld 100 (*) 70 - 99 mg/dL   BUN 29 (*) 6 - 23 mg/dL   Creatinine, Ser 1.95 (*) 0.50 - 1.35 mg/dL   Calcium 9.4  8.4 - 10.5 mg/dL   Total Protein 6.6  6.0 - 8.3 g/dL   Albumin 3.4 (*) 3.5 - 5.2 g/dL   AST 62 (*) 0 - 37 U/L   ALT 7  0 - 53 U/L   Alkaline Phosphatase 62  39 - 117 U/L   Total Bilirubin 1.3 (*) 0.3 - 1.2 mg/dL   GFR calc non Af Amer 35 (*) >90 mL/min   GFR calc Af Amer 41 (*) >90 mL/min  LACTIC ACID, PLASMA      Component Value Range   Lactic Acid, Venous 1.2  0.5 - 2.2 mmol/L  PROCALCITONIN      Component Value Range   Procalcitonin 21.57    URINALYSIS, ROUTINE W REFLEX MICROSCOPIC      Component Value Range   Color, Urine AMBER (*) YELLOW   APPearance HAZY (*) CLEAR   Specific Gravity, Urine 1.010  1.005 - 1.030   pH 6.0  5.0 - 8.0   Glucose, UA NEGATIVE  NEGATIVE mg/dL   Hgb urine dipstick LARGE (*) NEGATIVE   Bilirubin Urine NEGATIVE  NEGATIVE   Ketones, ur NEGATIVE  NEGATIVE mg/dL   Protein, ur TRACE (*) NEGATIVE mg/dL   Urobilinogen, UA 0.2  0.0 - 1.0 mg/dL   Nitrite NEGATIVE  NEGATIVE   Leukocytes, UA NEGATIVE  NEGATIVE  URINE MICROSCOPIC-ADD ON      Component Value Range   Squamous Epithelial / LPF RARE  RARE   WBC, UA 0-2  <3 WBC/hpf   RBC / HPF 0-2  <3 RBC/hpf   Bacteria, UA MANY (*) RARE   Dg Chest Port 1 View  09/12/2011  *RADIOLOGY REPORT*  Clinical Data: Fever, history Parkinson's, mental retardation, dimension  PORTABLE CHEST - 1 VIEW  Comparison: Portable exam 1618 hours compared to 04/14/2008  Findings: Normal heart size, mediastinal contours, and pulmonary vascularity. Lungs clear. No pleural effusion or pneumothorax. Minimal chronic peribronchial thickening. Bones unremarkable.  IMPRESSION: No acute abnormalities.  Original Report Authenticated By: Burnetta Sabin, M.D.     1. Cellulitis of leg   2. Leg swelling       MDM   Febrile illness with cellulitis. Doubt sepsis. Patient may have a right leg DVT as well.   Plan: Admit- PCP  I personally performed the services described in this documentation, which was scribed in my presence. The recorded information has been reviewed and considered.     Richarda Blade, MD 09/12/11 1806

## 2011-09-12 NOTE — Progress Notes (Signed)
Clarification this Probation officer spoke to [employer] from [employer] [facility], patient admission history completion in progress

## 2011-09-12 NOTE — ED Notes (Signed)
Upon further assessment, patient noted to have right lower leg redness and  swelling. Skin very hot to touch on right leg.

## 2011-09-12 NOTE — ED Notes (Signed)
Patient with c/o altered mental status and fever. From [facility] care, caregiver reports change in speech and walking. Unknown exact time of change, but best guess is around 1200 today. Patient febrile. Long psych history and history of mental retardation. Alert, answers some simple questions. Follows commands.

## 2011-09-12 NOTE — ED Notes (Signed)
Report given to Elmo Putt, RN on [id number].  Patient to be admitted to room 310.

## 2011-09-12 NOTE — Progress Notes (Signed)
This writer attempted to complete patient admission history, patient eyes open and verbally states leave me alone. Patient currently lives in [facility], attempted to contact Karolee Ohs at [phone] and [phone] left message at both numbers instructing to call facility.

## 2011-09-13 ENCOUNTER — Inpatient Hospital Stay (HOSPITAL_COMMUNITY): Payer: Medicare Other

## 2011-09-13 MED ORDER — SODIUM CHLORIDE 0.9 % IV SOLN
1000.0000 mL | INTRAVENOUS | Status: DC
  Administered 2011-09-13 – 2011-09-15 (×2): 1000 mL via INTRAVENOUS
  Administered 2011-09-17: 500 mL via INTRAVENOUS

## 2011-09-13 MED ORDER — VANCOMYCIN HCL IN DEXTROSE 1-5 GM/200ML-% IV SOLN
1000.0000 mg | INTRAVENOUS | Status: DC
  Administered 2011-09-13 – 2011-09-15 (×3): 1000 mg via INTRAVENOUS
  Filled 2011-09-13 (×4): qty 200

## 2011-09-13 NOTE — H&P (Signed)
[name], [name]          ACCOUNT NO.:  [id number]  MEDICAL RECORD NO.:  [id number]  LOCATION:  A310                          FACILITY:  [facility]  PHYSICIAN:  Rifka Ramey D. Legrand Rams, MD   DATE OF BIRTH:  1950-12-21  DATE OF ADMISSION:  09/12/2011 DATE OF DISCHARGE:  LH                             HISTORY & PHYSICAL   SUBJECTIVE:  Redness and swelling of the right leg.  HISTORY OF PRESENT ILLNESS:  This is a 61 year old male patient with a history of multiple medical illnesses, who is currently a resident of local assisted living, who was brought to emergency room with above complaint.  The patient has a history of mental retardation and psychosis.  He is unable to give any detailed history.  However, he is known to have chronic leg edema, for which he was on diuretics.  In the last few days, he had more swelling of the right leg with redness and pain.  The patient was brought to emergency room and he was evaluated. He was started on IV antibiotics for possible cellulitis.  Possibility of deep venous thrombosis also considered and the patient is currently started on Lovenox for anticoagulation until venous Doppler is performed.  REVIEW OF SYSTEMS:  The patient has no fever, chills, chest pain, cough, shortness of breath, nausea, vomiting, abdominal pain, dysuria, urgency, or frequency of urination.  PAST MEDICAL HISTORY: 1. Mental retardation. 2. Parkinson disease. 3. History of psychosis. 4. History of renal failure. 5. Chronic bilateral leg edema. 6. History of cellulitis of the left leg. 7. Hypertension.  MEDICATIONS: 1. Acetaminophen 500 mg p.o. q.6 hours p.r.n. 2. BuSpar 50 mg daily. 3. Sinemet 25/100 two tablets q.i.d. 4. Clonazepam 0.5 mg p.r.n. for agitation. 5. Clozaril 100 mg daily. 6. Aricept 10 mg daily. 7. Magnesium oxide 400 mg b.i.d. 8. KCl 20 mEq 2 tablets twice daily. 9. ReQuip 0.25 mg 2 tablets at bedtime. 10.Demadex 40 mg p.o. 2 tablets  daily.  SOCIAL HISTORY:  The patient is currently a resident of local assisted living.  He has history of tobacco smoking.  No history of alcohol or substance abuse.  FAMILY HISTORY:  Both his parents are deceased.  His mother had hypertension and diabetes mellitus.  His father had dementia.  PHYSICAL EXAMINATION:  GENERAL:  The patient is alert, awake, and chronically sick looking. VITAL SIGNS:  Blood pressure 93/44, pulse 76, respiratory rate 30 temperature 102.5 degree Fahrenheit. HEENT:  Pupils are equal and reactive. NECK:  Supple. CHEST:  Decreased air entry, few rhonchi. CARDIOVASCULAR SYSTEM:  First and second heart sounds heard.  No murmur. No gallop. ABDOMEN:  Soft and lax.  Bowel sound is positive.  No mass or organomegaly. EXTREMITIES:  The patient has diffuse redness, swelling and tenderness of the right leg.  He has also home 2+ edema of the left leg.  LABORATORY DATA:  CBC:  WBC 14.4, hemoglobin 13.0, hematocrit 40.8, and platelets 152.  BMP:  Sodium 134, potassium 4.0, chloride 95, carbon dioxide 30, glucose 100, BUN 29, creatinine 1.925, and calcium 9.4.  ASSESSMENT: 1. Cellulitis of the right leg. 2. Chronic renal failure. 3. Chronic leg edema. 4. Hypertension. 5. Mental retardation. 6. History of Parkinson disease.  7. History of psychosis.  PLAN:  We will continue the patient on IV antibiotics.  We will do venous ultrasound to rule out deep venous thrombosis.  In the meantime, we will continue on anticoagulation.  We will continue to monitor his renal function.  We will continue current medications and supportive care.     [name], MD     TDF/MEDQ  D:  09/13/2011  T:  09/13/2011  Job:  [id number]

## 2011-09-13 NOTE — Progress Notes (Signed)
[name], [name]          ACCOUNT NO.:  [id number]  MEDICAL RECORD NO.:  [id number]  LOCATION:  A310                          FACILITY:  [facility]  PHYSICIAN:  Kersti Scavone D. Legrand Rams, MD   DATE OF BIRTH:  1950-03-13  DATE OF PROCEDURE:  09/13/2011 DATE OF DISCHARGE:                                PROGRESS NOTE   SUBJECTIVE:  The patient feels better today.  His fever and pain is subsiding.  OBJECTIVE:  GENERAL:  The patient is alert, awake, sick looking. VITAL SIGNS:  Blood pressure 93/44, pulse 76, respiratory rate 13, temperature 100 degree Fahrenheit. CHEST:  Clear.  Lung field good air entry.  CARDIOVASCULAR:  First and second heart sounds heard.  No murmur.  No gallop. ABDOMEN:  Soft and lax.  Bowel sound is positive.  No mass or organomegaly.  EXTREMITIES:  There is diffuse redness and swelling of the right leg.  ASSESSMENT: 1. Cellulitis of the right leg. 2. Renal failure. 3. Hypertension. 4. Psychosis. 5. Parkinson disease.  PLAN:  Continue the patient on IV antibiotics.  We will follow ultrasound result.  In the meantime, we will continue anticoagulation. Continue regular treatment.     Mariadelaluz Guggenheim D. Legrand Rams, MD     TDF/MEDQ  D:  09/13/2011  T:  09/13/2011  Job:  [id number]

## 2011-09-13 NOTE — Consult Note (Signed)
ANTICOAGULATION CONSULT NOTE - Initial Consult  Pharmacy Consult for Lovenox Indication: VTE Treatment  No Known Allergies  Patient Measurements: Height: 5\' 8"  (172.7 cm) Weight: 165 lb (74.844 kg) IBW/kg (Calculated) : 68.4   Vital Signs: Temp: 100.1 F (37.8 C) (07/20 0622) Temp src: Oral (07/20 0622) BP: 103/58 mmHg (07/20 0622) Pulse Rate: 76  (07/20 0622)  Labs:  Basename 09/12/11 1600  HGB 13.0  HCT 40.8  PLT 152  APTT --  LABPROT --  INR --  HEPARINUNFRC --  CREATININE 1.95*  CKTOTAL --  CKMB --  TROPONINI --   Estimated Creatinine Clearance: 38.5 ml/min (by C-G formula based on Cr of 1.95).  Medical History: Past Medical History  Diagnosis Date  . Mental retardation   . Renal insufficiency   . Parkinson's disease   . Dementia   . Psychosis   . Hypertension   . Cellulitis   . Edema    Medications:  Scheduled:    . sodium chloride  1,000 mL Intravenous Once  . acetaminophen  1,000 mg Oral Once  . busPIRone  15 mg Oral BID  . carbidopa-levodopa  2 tablet Oral QID  . cloZAPine  100 mg Oral QHS  . donepezil  10 mg Oral Daily  . enoxaparin (LOVENOX) injection  1 mg/kg Subcutaneous Once  . enoxaparin (LOVENOX) injection  1 mg/kg Subcutaneous Q12H  . magnesium oxide  400 mg Oral BID  . potassium chloride SA  40 mEq Oral BID  . psyllium  1 packet Oral Daily  . torsemide  40 mg Oral BID  . vancomycin  1,000 mg Intravenous Once  . vancomycin  1,000 mg Intravenous Q24H   Assessment: 60yoM admitted from NH with AMS and cellulitis, possible DVT.  PMH as above.  SCr elevated. Estimated Creatinine Clearance: 38.5 ml/min (by C-G formula based on Cr of 1.95).  Goal of Therapy:  Anti-Xa level 0.6-1.2 units/ml 4hrs after LMWH dose given Monitor platelets by anticoagulation protocol: Yes   Plan: Lovenox 1mg /Kg sq q12hrs CBC per protocol  Hart Robinsons A 09/13/2011,8:06 AM

## 2011-09-13 NOTE — Consult Note (Signed)
ANTIBIOTIC CONSULT NOTE - INITIAL  Pharmacy Consult for Vancomycin Indication: cellulitis  No Known Allergies  Patient Measurements: Height: 5\' 8"  (172.7 cm) Weight: 165 lb (74.844 kg) IBW/kg (Calculated) : 68.4   Vital Signs: Temp: 100.1 F (37.8 C) (07/20 0622) Temp src: Oral (07/20 0622) BP: 103/58 mmHg (07/20 0622) Pulse Rate: 76  (07/20 0622) Intake/Output from previous day: 07/19 0701 - 07/20 0700 In: -  Out: 575 [Urine:575] Intake/Output from this shift:    Labs:  Basename 09/12/11 1600  WBC 14.4*  HGB 13.0  PLT 152  LABCREA --  CREATININE 1.95*   Estimated Creatinine Clearance: 38.5 ml/min (by C-G formula based on Cr of 1.95). No results found for this basename: VANCOTROUGH:2,VANCOPEAK:2,VANCORANDOM:2,GENTTROUGH:2,GENTPEAK:2,GENTRANDOM:2,TOBRATROUGH:2,TOBRAPEAK:2,TOBRARND:2,AMIKACINPEAK:2,AMIKACINTROU:2,AMIKACIN:2, in the last 72 hours   Microbiology: Recent Results (from the past 720 hour(s))  CULTURE, BLOOD (ROUTINE X 2)     Status: Normal (Preliminary result)   Collection Time   09/12/11  3:50 PM      Component Value Range Status Comment   Specimen Description BLOOD RIGHT ARM   Final    Special Requests BOTTLES DRAWN AEROBIC AND ANAEROBIC 8CC   Final    Culture NO GROWTH 1 DAY   Final    Report Status PENDING   Incomplete   CULTURE, BLOOD (ROUTINE X 2)     Status: Normal (Preliminary result)   Collection Time   09/12/11  4:00 PM      Component Value Range Status Comment   Specimen Description BLOOD RIGHT ARM   Final    Special Requests BOTTLES DRAWN AEROBIC AND ANAEROBIC [place]   Final    Culture NO GROWTH 1 DAY   Final    Report Status PENDING   Incomplete     Medical History: Past Medical History  Diagnosis Date  . Mental retardation   . Renal insufficiency   . Parkinson's disease   . Dementia   . Psychosis   . Hypertension   . Cellulitis   . Edema    Medications:  Scheduled:    . sodium chloride  1,000 mL Intravenous Once  .  acetaminophen  1,000 mg Oral Once  . busPIRone  15 mg Oral BID  . carbidopa-levodopa  2 tablet Oral QID  . cloZAPine  100 mg Oral QHS  . donepezil  10 mg Oral Daily  . enoxaparin (LOVENOX) injection  1 mg/kg Subcutaneous Once  . enoxaparin (LOVENOX) injection  1 mg/kg Subcutaneous Q12H  . magnesium oxide  400 mg Oral BID  . potassium chloride SA  40 mEq Oral BID  . psyllium  1 packet Oral Daily  . torsemide  40 mg Oral BID  . vancomycin  1,000 mg Intravenous Once  . vancomycin  1,000 mg Intravenous Q24H   Assessment: 60yoM admitted from NH with AMS and cellulitis.  Elevated SCr Estimated Creatinine Clearance: 38.5 ml/min (by C-G formula based on Cr of 1.95).  Goal of Therapy:  Vancomycin trough level 10-15 mcg/ml  Plan: Vancomycin 1gm iv q24hrs Check trough at steady state Monitor renal fxn, labs, and cultures per protocol  Hart Robinsons A 09/13/2011,8:10 AM

## 2011-09-14 LAB — URINE CULTURE: Culture: NO GROWTH

## 2011-09-14 LAB — BASIC METABOLIC PANEL
BUN: 13 mg/dL (ref 6–23)
Calcium: 8.5 mg/dL (ref 8.4–10.5)
Creatinine, Ser: 1.36 mg/dL — ABNORMAL HIGH (ref 0.50–1.35)
GFR calc Af Amer: 63 mL/min — ABNORMAL LOW (ref >90)
GFR calc non Af Amer: 55 mL/min — ABNORMAL LOW (ref >90)
Glucose, Bld: 107 mg/dL — ABNORMAL HIGH (ref 70–99)

## 2011-09-14 LAB — CBC
MCH: 28.2 pg (ref 26.0–34.0)
MCHC: 31.4 g/dL (ref 30.0–36.0)
Platelets: 142 10*3/uL — ABNORMAL LOW (ref 150–400)
RDW: 15 % (ref 11.5–15.5)

## 2011-09-14 MED ORDER — POTASSIUM CHLORIDE CRYS ER 20 MEQ PO TBCR
40.0000 meq | EXTENDED_RELEASE_TABLET | Freq: Once | ORAL | Status: AC
  Administered 2011-09-14: 40 meq via ORAL
  Filled 2011-09-14: qty 2

## 2011-09-14 MED ORDER — ENOXAPARIN SODIUM 40 MG/0.4ML ~~LOC~~ SOLN
40.0000 mg | SUBCUTANEOUS | Status: DC
  Administered 2011-09-15 – 2011-09-18 (×4): 40 mg via SUBCUTANEOUS
  Filled 2011-09-14 (×4): qty 0.4

## 2011-09-14 NOTE — Consult Note (Signed)
ANTICOAGULATION CONSULT NOTE   Pharmacy Consult for Lovenox Indication: VTE prophlaxis  No Known Allergies  Patient Measurements: Height: 5\' 8"  (172.7 cm) Weight: 165 lb (74.844 kg) IBW/kg (Calculated) : 68.4   Vital Signs: Temp: 97.8 F (36.6 C) (07/21 0612) Temp src: Oral (07/21 0612) BP: 93/56 mmHg (07/21 0612) Pulse Rate: 74  (07/21 0612)  Labs:  Basename 09/14/11 0615 09/12/11 1600  HGB 10.8* 13.0  HCT 34.4* 40.8  PLT 142* 152  APTT -- --  LABPROT -- --  INR -- --  HEPARINUNFRC -- --  CREATININE 1.36* 1.95*  CKTOTAL -- --  CKMB -- --  TROPONINI -- --   Estimated Creatinine Clearance: 55.2 ml/min (by C-G formula based on Cr of 1.36).  Medical History: Past Medical History  Diagnosis Date  . Mental retardation   . Renal insufficiency   . Parkinson's disease   . Dementia   . Psychosis   . Hypertension   . Cellulitis   . Edema    Medications:  Scheduled:     . busPIRone  15 mg Oral BID  . carbidopa-levodopa  2 tablet Oral QID  . cloZAPine  100 mg Oral QHS  . donepezil  10 mg Oral Daily  . enoxaparin (LOVENOX) injection  40 mg Subcutaneous Q24H  . magnesium oxide  400 mg Oral BID  . potassium chloride SA  40 mEq Oral BID  . potassium chloride  40 mEq Oral Once  . psyllium  1 packet Oral Daily  . torsemide  40 mg Oral BID  . vancomycin  1,000 mg Intravenous Q24H  . DISCONTD: enoxaparin (LOVENOX) injection  1 mg/kg Subcutaneous Q12H   Assessment: 60yoM admitted from NH with AMS and cellulitis.  PMH as above.  SCr elevated but improving. Estimated Creatinine Clearance: 55.2 ml/min (by C-G formula based on Cr of 1.36).  Goal of Therapy:  VTE prophylaxis Monitor platelets by anticoagulation protocol: Yes   Plan: Lovenox 40mg  SQ q24hrs CBC per protocol  Hart Robinsons A 09/14/2011,9:34 AM

## 2011-09-14 NOTE — Social Work (Signed)
CSW left message for [name] to see if FL2 will be needed when patient returns to facility.   Assigned CSW will continue to follow.   [place] Chara Marquard ANN S , MSW, LCSWA 09/14/2011 1:48 PM [id number]

## 2011-09-14 NOTE — Progress Notes (Signed)
Subjective: Patient feels  Better. His fever his fever is subsiding. Less swelling of the rt leg. No new complaint  Physical Exam: Blood pressure 93/56, pulse 74, temperature 97.8 F (36.6 C), temperature source Oral, resp. rate 20, height 5\' 8"  (1.727 m), weight 74.844 kg (165 lb), SpO2 100.00%. Chest- good air entry, clear lung field CVS- S1 & S2 heard Abd- Soft and lax, bowel sound is positive Extremities- diffuse swelling of the Rt leg with redness  Investigations:  Recent Results (from the past 240 hour(s))  CULTURE, BLOOD (ROUTINE X 2)     Status: Normal (Preliminary result)   Collection Time   09/12/11  3:50 PM      Component Value Range Status Comment   Specimen Description BLOOD RIGHT ARM   Final    Special Requests BOTTLES DRAWN AEROBIC AND ANAEROBIC 8CC   Final    Culture NO GROWTH 2 DAYS   Final    Report Status PENDING   Incomplete   CULTURE, BLOOD (ROUTINE X 2)     Status: Normal (Preliminary result)   Collection Time   09/12/11  4:00 PM      Component Value Range Status Comment   Specimen Description BLOOD RIGHT ARM   Final    Special Requests BOTTLES DRAWN AEROBIC AND ANAEROBIC [place]   Final    Culture NO GROWTH 2 DAYS   Final    Report Status PENDING   Incomplete      Basic Metabolic Panel:  Basename 09/14/11 0615 09/12/11 1600  NA 130* 134*  K 3.3* 4.0  CL 92* 95*  CO2 33* 30  GLUCOSE 107* 100*  BUN 13 29*  CREATININE 1.36* 1.95*  CALCIUM 8.5 9.4  MG -- --  PHOS -- --   Liver Function Tests:  Basename 09/12/11 1600  AST 62*  ALT 7  ALKPHOS 62  BILITOT 1.3*  PROT 6.6  ALBUMIN 3.4*     CBC:  Basename 09/14/11 0615 09/12/11 1600  WBC 10.1 14.4*  NEUTROABS -- 12.9*  HGB 10.8* 13.0    HCT 34.4* 40.8  MCV 89.8 87.9  PLT 142* 152    US Venous Img Lower Bilateral  09/13/2011  *RADIOLOGY REPORT*  Clinical Data: Lower extremity swelling, pain, and edema.  Altered mental status.  BILATERAL LOWER EXTREMITY VENOUS DUPLEX ULTRASOUND  Technique: Gray-scale sonography with graded compression, as well as color Doppler and duplex ultrasound, were performed to evaluate the deep venous system of the lower extremity on both sides from the level of the common femoral vein through the popliteal and proximal calf veins. Spectral Doppler was utilized to evaluate flow at rest and with distal augmentation maneuvers.  Comparison: None  Findings:  Normal compressibility of the  bilateral common femoral, superficial femoral, and popliteal veins is demonstrated, as well as the visualized proximal calf veins.  No filling defects to suggest DVT on grayscale or color Doppler imaging.  Doppler waveforms show normal direction of venous flow, normal respiratory phasicity and response to augmentation.  IMPRESSION: 1.  No evidence of lower extremity deep vein thrombosis.  Original Report Authenticated By: Carron Curie, M.D.   Dg Chest Port 1 View  09/12/2011  *RADIOLOGY REPORT*  Clinical Data: Fever, history Parkinson's, mental retardation, dimension  PORTABLE CHEST - 1 VIEW  Comparison: Portable exam 1618 hours compared to 04/14/2008  Findings: Normal heart size, mediastinal contours, and pulmonary vascularity. Lungs clear. No pleural effusion or pneumothorax. Minimal chronic peribronchial thickening. Bones unremarkable.  IMPRESSION: No acute abnormalities.  Original Report Authenticated By: Burnetta Sabin, M.D.      Medications: I have reviewed the patient's current medications.  Impression: 1. RT leg Cellulitis 2.. DVT ruled out 3.Chronic leg edema 4. Renal failure 5. Mental retardation Active Problems:  * No active hospital problems. *      Plan: We will continue iv antibiotics. We will  change Lovenox dose to prophylactic dose. Continue regular treatment     LOS: 2 days   [facility] Pager [phone]  09/14/2011, 9:37 AM

## 2011-09-15 LAB — BASIC METABOLIC PANEL
CO2: 35 mEq/L — ABNORMAL HIGH (ref 19–32)
Calcium: 9 mg/dL (ref 8.4–10.5)
Chloride: 101 mEq/L (ref 96–112)
Sodium: 141 mEq/L (ref 135–145)

## 2011-09-15 LAB — VANCOMYCIN, TROUGH: Vancomycin Tr: <5 ug/mL — ABNORMAL LOW (ref 10.0–20.0)

## 2011-09-15 MED ORDER — VANCOMYCIN HCL IN DEXTROSE 1-5 GM/200ML-% IV SOLN
1000.0000 mg | Freq: Two times a day (BID) | INTRAVENOUS | Status: DC
  Administered 2011-09-15 – 2011-09-18 (×6): 1000 mg via INTRAVENOUS
  Filled 2011-09-15 (×8): qty 200

## 2011-09-15 NOTE — Care Management Note (Signed)
    Page 1 of 2   09/18/2011     10:21:24 AM   CARE MANAGEMENT NOTE 09/18/2011  Patient:  [name], [name]   Account Number:  [id number]  Date Initiated:  09/15/2011  Documentation initiated by:  Theophilus Kinds  Subjective/Objective Assessment:   Pt admitted from [facility] with cellulitis. Pt may need IV AB at discharge. Pt will not be able to return to [facility] if on IV AB.     Action/Plan:   CSW is aware of pts admission and will start bed search for higher level of care for I VAB therapy.   Anticipated DC Date:  09/17/2011   Anticipated DC Plan:  SKILLED NURSING FACILITY  In-house referral  Clinical Social Worker      DC Forensic scientist  CM consult      [facility] Choice  HOME HEALTH   Choice offered to / List presented to:  C-2 HC POA / Guardian        HH arranged  HH-1 RN  IV Antibiotics      [place].   Status of service:  Completed, signed off Medicare Important Message given?   (If response is "NO", the following Medicare IM given date fields will be blank) Date Medicare IM given:   Date Additional Medicare IM given:    Discharge Disposition:  ASSISTED LIVING  Per UR Regulation:    If discussed at Long Length of Stay Meetings, dates discussed:    Comments:  09/18/11 [place], RN BSN CM Pt discharged back to [facility]. Pt will be discharged on IV AB. Facility uses [facility]. [place] Calix of [facility] is aware of pt and will colllect the pts informtion from the chart. CSW will arrange discharge to facility. Pts nurse is aware of discharge arrangements.  09/15/11 [place], RN BSN CM

## 2011-09-15 NOTE — Progress Notes (Signed)
UR Chart Review Completed  

## 2011-09-15 NOTE — Consult Note (Signed)
ANTIBIOTIC CONSULT NOTE   Pharmacy Consult for Vancomycin Indication: cellulitis  No Known Allergies  Patient Measurements: Height: 5\' 8"  (172.7 cm) Weight: 165 lb (74.844 kg) IBW/kg (Calculated) : 68.4   Vital Signs: Temp: 99.5 F (37.5 C) (07/22 0602) BP: 107/71 mmHg (07/22 0602) Pulse Rate: 101  (07/22 0602) Intake/Output from previous day: 07/21 0701 - 07/22 0700 In: 850 [P.O.:600; I.V.:250] Out: 200 [Urine:200] Intake/Output from this shift:    Labs:  Basename 09/15/11 0504 09/14/11 0615 09/12/11 1600  WBC -- 10.1 14.4*  HGB -- 10.8* 13.0  PLT -- 142* 152  LABCREA -- -- --  CREATININE 1.17 1.36* 1.95*   Estimated Creatinine Clearance: 64.1 ml/min (by C-G formula based on Cr of 1.17).  Basename 09/15/11 0914  VANCOTROUGH <5.0*  VANCOPEAK --  Jake Michaelis --  GENTTROUGH --  GENTPEAK --  GENTRANDOM --  TOBRATROUGH --  TOBRAPEAK --  TOBRARND --  AMIKACINPEAK --  AMIKACINTROU --  AMIKACIN --    Microbiology: Recent Results (from the past 720 hour(s))  CULTURE, BLOOD (ROUTINE X 2)     Status: Normal (Preliminary result)   Collection Time   09/12/11  3:50 PM      Component Value Range Status Comment   Specimen Description BLOOD RIGHT ARM   Final    Special Requests BOTTLES DRAWN AEROBIC AND ANAEROBIC 8CC   Final    Culture NO GROWTH 3 DAYS   Final    Report Status PENDING   Incomplete   CULTURE, BLOOD (ROUTINE X 2)     Status: Normal (Preliminary result)   Collection Time   09/12/11  4:00 PM      Component Value Range Status Comment   Specimen Description BLOOD RIGHT ARM   Final    Special Requests BOTTLES DRAWN AEROBIC AND ANAEROBIC [place]   Final    Culture NO GROWTH 3 DAYS   Final    Report Status PENDING   Incomplete   URINE CULTURE     Status: Normal   Collection Time   09/12/11  4:50 PM      Component Value Range Status Comment   Specimen Description URINE, CATHETERIZED   Final    Special Requests NONE   Final    Culture  Setup Time 09/13/2011  01:43   Final    Colony Count NO GROWTH   Final    Culture NO GROWTH   Final    Report Status 09/14/2011 FINAL   Final    Medical History: Past Medical History  Diagnosis Date  . Mental retardation   . Renal insufficiency   . Parkinson's disease   . Dementia   . Psychosis   . Hypertension   . Cellulitis   . Edema    Medications:  Scheduled:     . busPIRone  15 mg Oral BID  . carbidopa-levodopa  2 tablet Oral QID  . cloZAPine  100 mg Oral QHS  . donepezil  10 mg Oral Daily  . enoxaparin (LOVENOX) injection  40 mg Subcutaneous Q24H  . magnesium oxide  400 mg Oral BID  . potassium chloride SA  40 mEq Oral BID  . psyllium  1 packet Oral Daily  . torsemide  40 mg Oral BID  . vancomycin  1,000 mg Intravenous Q12H  . DISCONTD: vancomycin  1,000 mg Intravenous Q24H   Assessment: [name] admitted from NH with AMS and cellulitis.  Renal fxn has improved since admission Estimated Creatinine Clearance: 64.1 ml/min (by C-G formula based on  Cr of 1.17). Trough level below goal  Goal of Therapy:  Vancomycin trough level 10-15 mcg/ml  Plan: Increase Vancomycin to 1gm iv q12hrs Check trough weekly while on Vancomycin Duration of Vancomycin per MD Monitor renal fxn, labs, and cultures per protocol  Hart Robinsons A 09/15/2011,1:21 PM

## 2011-09-15 NOTE — Clinical Social Work Psychosocial (Signed)
Clinical Social Work Department BRIEF PSYCHOSOCIAL ASSESSMENT 09/15/2011  Patient:  [name], [name]     Account Number:  [id number]     Admit date:  09/12/2011  Clinical Social Worker:  Wyatt Haste  Date/Time:  09/15/2011 12:25 PM  Referred by:  Care Management  Date Referred:  09/15/2011 Referred for  ALF Placement   Other Referral:   Interview type:  Patient Other interview type:   facility  cousin    PSYCHOSOCIAL DATA Living Status:  FACILITY Admitted from facility:   Level of care:  [place] Primary support name:  [name] Primary support relationship to patient:  FAMILY Degree of support available:   [name] (cousin)- adequate  Pt is from [place] Current Concerns  Post-Acute Placement   Other Concerns:    SOCIAL WORK ASSESSMENT / PLAN CSW met with pt at bedside. Pt's communication is very limited. He did say [name] when asked about where he lived. CS[facility] who reports pt is a resident at her facility and has been there for about 15 years.  Pt requires limited assist with ADLs and is incontinent. Possible need for IV antibiotics at d/c. [name] is aware and states that they can manage IV antibiotics at facility with home health. Facility uses [facility] for home health needs. Okay for return at d/c. [name] provided contact information for pt's cousin, [name] who is his contact. [name] states he lives locally and checks in on pt occasionally. He has been notified of possible need for IV antibiotics as well and requests return to [facility].   Assessment/plan status:  Psychosocial Support/Ongoing Assessment of Needs Other assessment/ plan:   Information/referral to community resources:   [facility]    PATIENT'S/FAMILY'S RESPONSE TO PLAN OF CARE: Pt unable to discuss plan of care. Family report positive feelings regarding return to [facility] when  medically stable.  CSW will continue to follow.        Benay Pike, [place]

## 2011-09-15 NOTE — Progress Notes (Signed)
NAMEKHOBE, ELLENBECKER          ACCOUNT NO.:  [id number]  MEDICAL RECORD NO.:  [id number]  LOCATION:  A310                          FACILITY:  [facility]  PHYSICIAN:  Corrisa Gibby D. Legrand Rams, MD   DATE OF BIRTH:  [birth year]  DATE OF PROCEDURE:  09/15/2011 DATE OF DISCHARGE:                                PROGRESS NOTE   SUBJECTIVE:  The patient feels better.  His leg swelling and pain is subsiding.  No fever or chills.  OBJECTIVE:  VITAL SIGNS:  Blood pressure 107/71, pulse 101, respiratory rate 20, temperature 99 degree Fahrenheit. CHEST:  Decreased air entry, few rhonchi. CARDIOVASCULAR SYSTEM:  First and second heart sounds heard.  No murmur. No gallop. ABDOMEN:  Soft and lax.  Bowel sounds positive.  No mass or organomegaly. EXTREMITIES:  Redness and diffuse swelling of the right leg.  LABORATORY DATA:  Sodium 141, potassium 3.6, chloride 101, carbon dioxide 35, glucose 106, BUN 13, creatinine 1.17, calcium 9.0.  ASSESSMENT: 1. Cellulitis of the right leg. 2. Chronic bilateral leg edema. 3. Hypertension. 4. History of Parkinson disease. 5. History of mental retardation.  PLAN:  Continue the patient on IV antibiotics.  Continue DVT prophylaxis.  Continue current treatment and supportive care.     Vincient Vanaman D. Legrand Rams, MD     TDF/MEDQ  D:  09/15/2011  T:  09/15/2011  Job:  [id number]

## 2011-09-16 ENCOUNTER — Inpatient Hospital Stay (HOSPITAL_COMMUNITY): Payer: Medicare Other

## 2011-09-16 ENCOUNTER — Encounter (HOSPITAL_COMMUNITY): Payer: Medicaid Other

## 2011-09-16 MED ORDER — SODIUM CHLORIDE 0.9 % IJ SOLN
10.0000 mL | Freq: Two times a day (BID) | INTRAMUSCULAR | Status: DC
  Administered 2011-09-16 – 2011-09-18 (×4): 10 mL
  Filled 2011-09-16 (×4): qty 3

## 2011-09-16 MED ORDER — ONDANSETRON HCL 4 MG/2ML IJ SOLN: 4.0000 mg | Freq: Three times a day (TID) | INTRAMUSCULAR | Status: AC | PRN

## 2011-09-16 MED ORDER — SODIUM CHLORIDE 0.9 % IJ SOLN: 10.0000 mL | INTRAMUSCULAR | Status: DC | PRN

## 2011-09-16 MED ORDER — ACETAMINOPHEN 325 MG PO TABS
650.0000 mg | ORAL_TABLET | Freq: Once | ORAL | Status: AC
  Administered 2011-09-16: 650 mg via ORAL
  Filled 2011-09-16: qty 2

## 2011-09-16 NOTE — Progress Notes (Signed)
Subjective: Patient feels better. He is receiving iv antibiotics. No ne complaint.  Physical Exam: Blood pressure 100/66, pulse 63, temperature 98.5 F (36.9 C), temperature source Oral, resp. rate 20, height 5\' 8"  (1.727 m), weight 74.844 kg (165 lb), SpO2 100.00%.  Chest - clear lung field, good air entry CVS- s1 and s2 heard no murmur or gallop Abdomen--soft and lax, bowel sound is ++ Extremities-- Rt leg is diffusely swollen, tender and red   Investigations:  Recent Results (from the past 240 hour(s))  CULTURE, BLOOD (ROUTINE X 2)     Status: Normal (Preliminary result)   Collection Time   09/12/11  3:50 PM      Component Value Range Status Comment   Specimen Description BLOOD RIGHT ARM   Final    Special Requests BOTTLES DRAWN AEROBIC AND ANAEROBIC 8CC   Final    Culture NO GROWTH 3 DAYS   Final    Report Status PENDING   Incomplete   CULTURE, BLOOD (ROUTINE X 2)     Status:  Normal (Preliminary result)   Collection Time   09/12/11  4:00 PM      Component Value Range Status Comment   Specimen Description BLOOD RIGHT ARM   Final    Special Requests BOTTLES  DRAWN AEROBIC AND ANAEROBIC [place]   Final    Culture NO GROWTH 3 DAYS   Final    Report Status PENDING   Incomplete   URINE CULTURE     Status: Normal   Collection Time   09/12/11  4:50 PM      Component Value Range Status Comment   Specimen Description URINE, CATHETERIZED   Final    Special Requests NONE   Final    Culture  Setup Time 09/13/2011 01:43   Final    Colony Count NO GROWTH   Final    Culture NO GROWTH   Final    Report Status 09/14/2011 FINAL   Final      Basic Metabolic Panel:  Basename 09/15/11 0504 09/14/11 0615  NA 141 130*  K 3.6 3.3*  CL 101 92*  CO2 35* 33*  GLUCOSE 106* 107*  BUN 13 13  CREATININE 1.17 1.36*  CALCIUM 9.0 8.5  MG -- --  PHOS -- --   Liver Function Tests: No results found for this basename: AST:2,ALT:2,ALKPHOS:2,BILITOT:2,PROT:2,ALBUMIN:2 in the last 72 hours   CBC:  Basename 09/14/11 0615  WBC 10.1  NEUTROABS --  HGB 10.8*  HCT 34.4*  MCV 89.8  PLT 142*    No results found.    Medications: I have reviewed the patient's current medications.  Impression: 1. Rt leg cellulitis 2. Chronic leg edema 3. Mental retardatio Active Problems:  * No active hospital problems. *      Plan: Continue iv antibiotics.     LOS: 4 days   [name]   09/16/2011, 8:21 AM

## 2011-09-17 LAB — BASIC METABOLIC PANEL
CO2: 39 mEq/L — ABNORMAL HIGH (ref 19–32)
Calcium: 9.6 mg/dL (ref 8.4–10.5)
GFR calc Af Amer: 80 mL/min — ABNORMAL LOW (ref >90)
Sodium: 140 mEq/L (ref 135–145)

## 2011-09-17 LAB — VANCOMYCIN, TROUGH: Vancomycin Tr: 14.4 ug/mL (ref 10.0–20.0)

## 2011-09-17 LAB — CULTURE, BLOOD (ROUTINE X 2)
Culture: NO GROWTH
Culture: NO GROWTH

## 2011-09-17 NOTE — Evaluation (Signed)
Physical Therapy Evaluation Patient Details Name: [name] MRN: [id number] DOB: 10-23-1950 Today's Date: 09/17/2011 Time: [id number] PT Time Calculation (min): 27 min  PT Assessment / Plan / Recommendation Clinical Impression  A limited eval only could be done because of significant cognitive deficit.  He appears to have functional strength for simple transfers.  He was able to ambulate 80' with contact guard assist.  His gait has a Parkinsonian pattern.  He whould be able to return to previous environment with no increased  deficit    PT Assessment  Patent does not need any further PT services    Follow Up Recommendations  No PT follow up    Barriers to Discharge        Equipment Recommendations  None recommended by PT    Recommendations for Other Services     Frequency      Precautions / Restrictions Precautions Precautions: Fall Restrictions Weight Bearing Restrictions: No   Pertinent Vitals/Pain       Mobility  Bed Mobility Bed Mobility: Supine to Sit;Sit to Supine Supine to Sit: 6: Modified independent (Device/Increase time);HOB elevated;With rails Sit to Supine: With rail;4: Min assist Transfers Transfers: Sit to Stand;Stand to Sit Sit to Stand: 5: Supervision;From bed;With upper extremity assist Stand to Sit: 5: Supervision;To bed;With upper extremity assist Ambulation/Gait Ambulation/Gait Assistance: 4: Min guard Ambulation Distance (Feet): 60 Feet Assistive device: None Gait Pattern: Festinating;Narrow base of support;Shuffle General Gait Details: pt has Parkinsonian gait pattern and tends to needs more guarding when turning...otherwise, no LOB Stairs: No Wheelchair Mobility Wheelchair Mobility: No    Exercises     PT Diagnosis:    PT Problem List:   PT Treatment Interventions:     PT Goals    Visit Information  Last PT Received On: 09/17/11    Subjective Data  Subjective: no c/o Patient Stated Goal: none stated   Prior  Functioning  Home Living Available Help at Discharge: Personal care attendant Type of Home: Assisted living Home Access: Level entry Home Layout: One level Additional Comments: equipment present is unknown Prior Function Level of Independence: Needs assistance Driving: No Vocation: Unemployed Communication Communication: Expressive difficulties    Cognition  Overall Cognitive Status: History of cognitive impairments - at baseline Arousal/Alertness: Awake/alert Orientation Level: Appears intact for tasks assessed Behavior During Session: [facility] for tasks performed    Extremity/Trunk Assessment     Balance Balance Balance Assessed: No  End of Session PT - End of Session Equipment Utilized During Treatment: Gait belt Activity Tolerance: Patient tolerated treatment well Patient left: in bed;with call bell/phone within reach;with bed alarm set Nurse Communication: Mobility status  GP     Sable Feil 09/17/2011, 3:29 PM

## 2011-09-17 NOTE — Consult Note (Signed)
ANTIBIOTIC CONSULT NOTE   Pharmacy Consult for Vancomycin Indication: cellulitis  No Known Allergies  Patient Measurements: Height: 5\' 8"  (172.7 cm) Weight: 165 lb (74.844 kg) IBW/kg (Calculated) : 68.4   Vital Signs: Temp: 98.3 F (36.8 C) (07/24 0526) Temp src: Oral (07/24 0526) BP: 120/73 mmHg (07/24 0526) Pulse Rate: 83  (07/24 0526) Intake/Output from previous day: 07/23 0701 - 07/24 0700 In: 1410 [P.O.:610; I.V.:400; IV Piggyback:400] Out: 251 [Urine:250; Stool:1] Intake/Output from this shift: Total I/O In: 200 [IV Piggyback:200] Out: -   Labs:  Basename 09/17/11 0732 09/15/11 0504  WBC -- --  HGB -- --  PLT -- --  LABCREA -- --  CREATININE 1.12 1.17   Estimated Creatinine Clearance: 67 ml/min (by C-G formula based on Cr of 1.12).  Basename 09/17/11 0732 09/15/11 0914  VANCOTROUGH 14.4 <5.0*  VANCOPEAK -- --  Jake Michaelis -- --  GENTTROUGH -- --  GENTPEAK -- --  GENTRANDOM -- --  TOBRATROUGH -- --  TOBRAPEAK -- --  TOBRARND -- --  AMIKACINPEAK -- --  AMIKACINTROU -- --  AMIKACIN -- --    Microbiology: Recent Results (from the past 720 hour(s))  CULTURE, BLOOD (ROUTINE X 2)     Status: Normal   Collection Time   09/12/11  3:50 PM      Component Value Range Status Comment   Specimen Description BLOOD RIGHT ARM DRAWN BY RN   Final    Special Requests BOTTLES DRAWN AEROBIC AND ANAEROBIC 8CC   Final    Culture NO GROWTH 5 DAYS   Final    Report Status 09/17/2011 FINAL   Final   CULTURE, BLOOD (ROUTINE X 2)     Status: Normal   Collection Time   09/12/11  4:00 PM      Component Value Range Status Comment   Specimen Description BLOOD RIGHT ARM DRAWN BY RN   Final    Special Requests BOTTLES DRAWN AEROBIC AND ANAEROBIC [place]   Final    Culture NO GROWTH 5 DAYS   Final    Report Status 09/17/2011 FINAL   Final   URINE CULTURE     Status: Normal   Collection Time   09/12/11  4:50 PM      Component Value Range Status Comment   Specimen Description  URINE, CATHETERIZED   Final    Special Requests NONE   Final    Culture  Setup Time 09/13/2011 01:43   Final    Colony Count NO GROWTH   Final    Culture NO GROWTH   Final    Report Status 09/14/2011 FINAL   Final    Medical History: Past Medical History  Diagnosis Date  . Mental retardation   . Renal insufficiency   . Parkinson's disease   . Dementia   . Psychosis   . Hypertension   . Cellulitis   . Edema    Medications:  Scheduled:     . busPIRone  15 mg Oral BID  . carbidopa-levodopa  2 tablet Oral QID  . cloZAPine  100 mg Oral QHS  . donepezil  10 mg Oral Daily  . enoxaparin (LOVENOX) injection  40 mg Subcutaneous Q24H  . magnesium oxide  400 mg Oral BID  . potassium chloride SA  40 mEq Oral BID  . psyllium  1 packet Oral Daily  . sodium chloride  10-40 mL Intracatheter Q12H  . torsemide  40 mg Oral BID  . vancomycin  1,000 mg Intravenous Q12H  Assessment: 60yoM admitted from NH with AMS and cellulitis.  Renal fxn has improved since admission Estimated Creatinine Clearance: 67 ml/min (by C-G formula based on Cr of 1.12). Trough level now on target  Goal of Therapy:  Vancomycin trough level 10-15 mcg/ml  Plan: continue Vancomycin 1gm iv q12hrs Check trough weekly while on Vancomycin Duration of Vancomycin per MD Monitor renal fxn, labs, and cultures per protocol  Hart Robinsons A 09/17/2011,10:37 AM

## 2011-09-17 NOTE — Progress Notes (Signed)
[name], [name]          ACCOUNT NO.:  [id number]  MEDICAL RECORD NO.:  [id number]  LOCATION:  A310                          FACILITY:  [facility]  PHYSICIAN:  Astrid Vides D. Legrand Rams, MD   DATE OF BIRTH:  [birth year]  DATE OF PROCEDURE:  09/17/2011 DATE OF DISCHARGE:                                PROGRESS NOTE   SUBJECTIVE:  The patient feels better.  His pain and swelling is improving.  No fever or chills.  OBJECTIVE:  GENERAL:  The patient is alert, awake, chronically sick looking. VITAL SIGNS:  Blood pressure 167/84, pulse 83, respiratory rate 20, temperature 98.3 degrees Fahrenheit. CHEST:  Decreased air entry, few rhonchi. CARDIOVASCULAR:  First and second heart sounds heard.  No murmur.  No gallop. ABDOMEN:  Soft and lax.  Bowel sound is positive.  No mass or organomegaly. EXTREMITIES:  There is diffuse redness and swelling of the right leg. It is improving and the swelling is getting less.  ASSESSMENT: 1. Cellulitis of the right leg. 2. History of chronic leg edema. 3. Hypertension. 4. Mental retardation.  PLAN:  We will continue the patient on IV antibiotics.  We will do physical therapy for ambulation.  We will continue his regular treatment.  We will plan for discharge with IV antibiotics in the next 1 or 2 days.     Orabelle Rylee D. Legrand Rams, MD     TDF/MEDQ  D:  09/17/2011  T:  09/17/2011  Job:  [id number]

## 2011-09-18 MED ORDER — VANCOMYCIN HCL IN DEXTROSE 1-5 GM/200ML-% IV SOLN: 1000.0000 mg | Freq: Two times a day (BID) | INTRAVENOUS | Status: DC

## 2011-09-18 MED ORDER — SODIUM CHLORIDE 0.9 % IJ SOLN
INTRAMUSCULAR | Status: AC
  Administered 2011-09-18: 10 mL
  Filled 2011-09-18: qty 3

## 2011-09-18 MED ORDER — HEPARIN SOD (PORK) LOCK FLUSH 100 UNIT/ML IV SOLN
250.0000 [IU] | INTRAVENOUS | Status: AC | PRN
  Administered 2011-09-18: 250 [IU]
  Filled 2011-09-18: qty 5

## 2011-09-18 NOTE — Discharge Planning (Signed)
Physician Discharge Summary  Patient ID: [name] MRN: [id number] DOB/AGE: 04/08/1950 61 y.o. Primary Care Physician:Avina Eberle, MD Admit date: 09/12/2011 Discharge date: 09/18/2011    Discharge Diagnoses:  Cellulitis of the right leg.  2. Chronic renal failure.  3. Chronic leg edema.  4. Hypertension.  5. Mental retardation.  6. History of Parkinson disease.  7. History of psychosis  Active Problems:  * No active hospital problems. *    Medication List  As of 09/18/2011  8:11 AM   TAKE these medications         busPIRone 15 MG tablet   Commonly known as: BUSPAR   Take 15 mg by mouth 2 (two) times daily.      carbidopa-levodopa 25-100 MG per tablet   Commonly known as: SINEMET IR   Take 2 tablets by mouth 4 (four) times daily.      clonazePAM 0.5 MG tablet   Commonly known as: KLONOPIN   Take 0.5 mg by mouth daily as needed. For agitation      cloZAPine 100 MG tablet   Commonly known as: CLOZARIL   Take 100 mg by mouth at bedtime.      donepezil 10 MG tablet   Commonly known as: ARICEPT   Take 10 mg by mouth daily.      magnesium oxide 400 MG tablet   Commonly known as: MAG-OX   Take 400 mg by mouth 2 (two) times daily.      potassium chloride SA 20 MEQ tablet   Commonly known as: K-DUR,KLOR-CON   Take 40 mEq by mouth 2 (two) times daily.      psyllium 0.52 G capsule   Commonly known as: REGULOID   Take 1.04 g by mouth every morning.      Q-PAP 500 MG tablet   Generic drug: acetaminophen   Take 500 mg by mouth every 6 (six) hours as needed. For pain      torsemide 20 MG tablet   Commonly known as: DEMADEX   Take 40 mg by mouth 2 (two) times daily.      vancomycin 1 GM/200ML Soln   Commonly known as: VANCOCIN   Inject 200 mLs (1,000 mg total) into the vein every 12 (twelve) hours. Pharmacy to monitor vancomycin level and adjust dose            Discharged Condition: stable    Consults:  Significant Diagnostic Studies: US  Venous Img Lower Bilateral  09/13/2011  *RADIOLOGY REPORT*  Clinical Data: Lower extremity swelling, pain, and edema.  Altered mental status.  BILATERAL LOWER EXTREMITY VENOUS DUPLEX ULTRASOUND  Technique: Gray-scale sonography with graded compression, as well as color Doppler and duplex ultrasound, were performed to evaluate the deep venous system of the lower extremity on both sides from the level of the common femoral vein through the popliteal and proximal calf veins. Spectral Doppler was utilized to evaluate flow at rest and with distal augmentation maneuvers.  Comparison: None  Findings:  Normal compressibility of the bilateral common femoral, superficial femoral, and popliteal veins is demonstrated, as well as the visualized proximal calf veins.  No filling defects to suggest DVT on grayscale or color Doppler imaging.  Doppler waveforms show normal direction of venous flow, normal respiratory phasicity and response to augmentation.  IMPRESSION: 1.  No evidence of lower extremity deep vein thrombosis.  Original Report Authenticated By: Carron Curie, M.D.   Dg Chest Port 1 View  09/16/2011  *RADIOLOGY REPORT*  Clinical Data: Status  post PICC placement.  PORTABLE CHEST - 1 VIEW  Comparison: Chest 09/12/2011.  Findings: Right PICC is in place with the tip in the lower superior vena cava.  Lungs are clear.  No pneumothorax or effusion.  Heart size normal.  IMPRESSION: Right PICC in good position.  No acute abnormality.  Original Report Authenticated By: Arvid Right. Luther Parody, M.D.   Dg Chest Port 1 View  09/12/2011  *RADIOLOGY REPORT*  Clinical Data: Fever, history Parkinson's, mental retardation, dimension  PORTABLE CHEST - 1 VIEW  Comparison: Portable exam 1618 hours compared to 04/14/2008  Findings: Normal heart size, mediastinal contours, and pulmonary vascularity. Lungs clear. No pleural effusion or pneumothorax. Minimal chronic peribronchial thickening. Bones unremarkable.  IMPRESSION: No acute  abnormalities.  Original Report Authenticated By: Burnetta Sabin, M.D.    Lab Results: Basic Metabolic Panel:  Basename 09/17/11 0732  NA 140  K 3.7  CL 96  CO2 39*  GLUCOSE 94  BUN 14  CREATININE 1.12  CALCIUM 9.6  MG --  PHOS --   Liver Function Tests: No results found for this basename: AST:2,ALT:2,ALKPHOS:2,BILITOT:2,PROT:2,ALBUMIN:2 in the last 72 hours   CBC: No results found for this basename: WBC:2,NEUTROABS:2,HGB:2,HCT:2,MCV:2,PLT:2 in the last 72 hours  Recent Results (from the past 240 hour(s))  CULTURE, BLOOD (ROUTINE X 2)     Status: Normal   Collection Time   09/12/11  3:50 PM      Component Value Range Status Comment   Specimen Description BLOOD RIGHT ARM DRAWN BY RN   Final    Special Requests BOTTLES DRAWN AEROBIC AND ANAEROBIC 8CC   Final    Culture NO GROWTH 5 DAYS   Final    Report Status 09/17/2011 FINAL   Final   CULTURE, BLOOD (ROUTINE X 2)     Status: Normal   Collection Time   09/12/11  4:00 PM      Component Value Range Status Comment   Specimen Description BLOOD RIGHT ARM DRAWN BY RN   Final    Special Requests BOTTLES DRAWN AEROBIC AND ANAEROBIC [place]   Final    Culture NO GROWTH 5 DAYS   Final    Report Status 09/17/2011 FINAL   Final   URINE CULTURE     Status: Normal   Collection Time   09/12/11  4:50 PM      Component Value Range Status Comment   Specimen Description URINE, CATHETERIZED   Final    Special Requests NONE   Final    Culture  Setup Time 09/13/2011 01:43   Final    Colony Count NO GROWTH   Final    Culture NO GROWTH   Final    Report Status 09/14/2011 FINAL   Final      Hospital Course: This  A 61 years male patient with multiple medical illness was admitted due to rt leg pain, redness and swelling. He was started on iv vancomycin for cellulitis. DVT was ruled out patient improved. He is going to be discharged on iv antibiotics to be adminstered by home health.  Discharge Exam: Blood pressure 106/67, pulse 63, temperature  97.9 F (36.6 C), temperature source Oral, resp. rate 20, height 5\' 8"  (1.727 m), weight 74.844 kg (165 lb), SpO2 100.00%.   Disposition:  To assisted living    Follow-up Information    Follow up with [facility], MD in 2 weeks.   Contact information:   [address] [place] [phone]  Signed: Meggen Spaziani   09/18/2011, 8:11 AM

## 2011-09-18 NOTE — Clinical Social Work Note (Signed)
Pt d/c back to [facility] with IV antibiotics. Beverly at facility is aware and agreeable. Home health has been arranged per CM. Pt's family, Olga Millers, notified of d/c by voicemail. Pt to transfer via facility vehicle.  Benay Pike, [place]

## 2011-09-22 DIAGNOSIS — L02419 Cutaneous abscess of limb, unspecified: Secondary | ICD-10-CM | POA: Diagnosis not present

## 2011-09-22 DIAGNOSIS — L03119 Cellulitis of unspecified part of limb: Secondary | ICD-10-CM | POA: Diagnosis not present

## 2013-02-28 DIAGNOSIS — I129 Hypertensive chronic kidney disease with stage 1 through stage 4 chronic kidney disease, or unspecified chronic kidney disease: Secondary | ICD-10-CM | POA: Diagnosis not present

## 2013-03-01 DIAGNOSIS — Z79899 Other long term (current) drug therapy: Secondary | ICD-10-CM | POA: Diagnosis not present

## 2013-03-07 DIAGNOSIS — F209 Schizophrenia, unspecified: Secondary | ICD-10-CM | POA: Diagnosis not present

## 2013-03-28 DIAGNOSIS — Z79899 Other long term (current) drug therapy: Secondary | ICD-10-CM | POA: Diagnosis not present

## 2013-04-01 DIAGNOSIS — L851 Acquired keratosis [keratoderma] palmaris et plantaris: Secondary | ICD-10-CM | POA: Diagnosis not present

## 2013-04-01 DIAGNOSIS — B351 Tinea unguium: Secondary | ICD-10-CM | POA: Diagnosis not present

## 2013-04-01 DIAGNOSIS — I739 Peripheral vascular disease, unspecified: Secondary | ICD-10-CM | POA: Diagnosis not present

## 2013-04-25 DIAGNOSIS — Z79899 Other long term (current) drug therapy: Secondary | ICD-10-CM | POA: Diagnosis not present

## 2013-05-19 DIAGNOSIS — G20 Parkinson's disease: Secondary | ICD-10-CM | POA: Diagnosis not present

## 2013-05-19 DIAGNOSIS — I10 Essential (primary) hypertension: Secondary | ICD-10-CM | POA: Diagnosis not present

## 2013-05-19 DIAGNOSIS — F0280 Dementia in other diseases classified elsewhere without behavioral disturbance: Secondary | ICD-10-CM | POA: Diagnosis not present

## 2013-05-26 DIAGNOSIS — Z79899 Other long term (current) drug therapy: Secondary | ICD-10-CM | POA: Diagnosis not present

## 2013-05-30 DIAGNOSIS — F209 Schizophrenia, unspecified: Secondary | ICD-10-CM | POA: Diagnosis not present

## 2013-06-10 DIAGNOSIS — B351 Tinea unguium: Secondary | ICD-10-CM | POA: Diagnosis not present

## 2013-06-13 DIAGNOSIS — G20 Parkinson's disease: Secondary | ICD-10-CM | POA: Diagnosis not present

## 2013-06-24 DIAGNOSIS — Z79899 Other long term (current) drug therapy: Secondary | ICD-10-CM | POA: Diagnosis not present

## 2013-06-30 DIAGNOSIS — G20 Parkinson's disease: Secondary | ICD-10-CM | POA: Diagnosis not present

## 2013-06-30 DIAGNOSIS — Z79899 Other long term (current) drug therapy: Secondary | ICD-10-CM | POA: Diagnosis not present

## 2013-07-07 DIAGNOSIS — Z79899 Other long term (current) drug therapy: Secondary | ICD-10-CM | POA: Diagnosis not present

## 2013-07-13 DIAGNOSIS — J069 Acute upper respiratory infection, unspecified: Secondary | ICD-10-CM | POA: Diagnosis not present

## 2013-07-13 DIAGNOSIS — I10 Essential (primary) hypertension: Secondary | ICD-10-CM | POA: Diagnosis not present

## 2013-07-25 DIAGNOSIS — Z79899 Other long term (current) drug therapy: Secondary | ICD-10-CM | POA: Diagnosis not present

## 2013-08-19 DIAGNOSIS — I739 Peripheral vascular disease, unspecified: Secondary | ICD-10-CM | POA: Diagnosis not present

## 2013-08-19 DIAGNOSIS — B351 Tinea unguium: Secondary | ICD-10-CM | POA: Diagnosis not present

## 2013-08-22 DIAGNOSIS — F209 Schizophrenia, unspecified: Secondary | ICD-10-CM | POA: Diagnosis not present

## 2013-08-25 DIAGNOSIS — Z79899 Other long term (current) drug therapy: Secondary | ICD-10-CM | POA: Diagnosis not present

## 2013-08-30 DIAGNOSIS — F0280 Dementia in other diseases classified elsewhere without behavioral disturbance: Secondary | ICD-10-CM | POA: Diagnosis not present

## 2013-08-30 DIAGNOSIS — I10 Essential (primary) hypertension: Secondary | ICD-10-CM | POA: Diagnosis not present

## 2013-08-30 DIAGNOSIS — G20 Parkinson's disease: Secondary | ICD-10-CM | POA: Diagnosis not present

## 2013-09-26 DIAGNOSIS — Z79899 Other long term (current) drug therapy: Secondary | ICD-10-CM | POA: Diagnosis not present

## 2013-10-25 DIAGNOSIS — Z79899 Other long term (current) drug therapy: Secondary | ICD-10-CM | POA: Diagnosis not present

## 2013-10-27 DIAGNOSIS — I10 Essential (primary) hypertension: Secondary | ICD-10-CM | POA: Diagnosis not present

## 2013-10-27 DIAGNOSIS — G20 Parkinson's disease: Secondary | ICD-10-CM | POA: Diagnosis not present

## 2013-10-27 DIAGNOSIS — F0280 Dementia in other diseases classified elsewhere without behavioral disturbance: Secondary | ICD-10-CM | POA: Diagnosis not present

## 2013-10-27 DIAGNOSIS — Z0000 Encounter for general adult medical examination without abnormal findings: Secondary | ICD-10-CM | POA: Diagnosis not present

## 2013-11-14 DIAGNOSIS — F209 Schizophrenia, unspecified: Secondary | ICD-10-CM | POA: Diagnosis not present

## 2013-11-21 DIAGNOSIS — Z79899 Other long term (current) drug therapy: Secondary | ICD-10-CM | POA: Diagnosis not present

## 2013-11-21 DIAGNOSIS — G20 Parkinson's disease: Secondary | ICD-10-CM | POA: Diagnosis not present

## 2013-11-21 DIAGNOSIS — R609 Edema, unspecified: Secondary | ICD-10-CM | POA: Diagnosis not present

## 2013-11-22 DIAGNOSIS — I129 Hypertensive chronic kidney disease with stage 1 through stage 4 chronic kidney disease, or unspecified chronic kidney disease: Secondary | ICD-10-CM | POA: Diagnosis not present

## 2013-11-24 DIAGNOSIS — Z79899 Other long term (current) drug therapy: Secondary | ICD-10-CM | POA: Diagnosis not present

## 2013-11-30 DIAGNOSIS — R2689 Other abnormalities of gait and mobility: Secondary | ICD-10-CM | POA: Diagnosis not present

## 2013-11-30 DIAGNOSIS — F0390 Unspecified dementia without behavioral disturbance: Secondary | ICD-10-CM | POA: Diagnosis not present

## 2013-11-30 DIAGNOSIS — F79 Unspecified intellectual disabilities: Secondary | ICD-10-CM | POA: Diagnosis not present

## 2013-11-30 DIAGNOSIS — I10 Essential (primary) hypertension: Secondary | ICD-10-CM | POA: Diagnosis not present

## 2013-12-01 DIAGNOSIS — I10 Essential (primary) hypertension: Secondary | ICD-10-CM | POA: Diagnosis not present

## 2013-12-01 DIAGNOSIS — R2689 Other abnormalities of gait and mobility: Secondary | ICD-10-CM | POA: Diagnosis not present

## 2013-12-01 DIAGNOSIS — F0390 Unspecified dementia without behavioral disturbance: Secondary | ICD-10-CM | POA: Diagnosis not present

## 2013-12-01 DIAGNOSIS — F79 Unspecified intellectual disabilities: Secondary | ICD-10-CM | POA: Diagnosis not present

## 2013-12-01 DIAGNOSIS — Z23 Encounter for immunization: Secondary | ICD-10-CM | POA: Diagnosis not present

## 2013-12-20 DIAGNOSIS — B351 Tinea unguium: Secondary | ICD-10-CM | POA: Diagnosis not present

## 2013-12-20 DIAGNOSIS — I739 Peripheral vascular disease, unspecified: Secondary | ICD-10-CM | POA: Diagnosis not present

## 2013-12-26 DIAGNOSIS — F0390 Unspecified dementia without behavioral disturbance: Secondary | ICD-10-CM | POA: Diagnosis not present

## 2013-12-26 DIAGNOSIS — Z79899 Other long term (current) drug therapy: Secondary | ICD-10-CM | POA: Diagnosis not present

## 2013-12-26 DIAGNOSIS — F79 Unspecified intellectual disabilities: Secondary | ICD-10-CM | POA: Diagnosis not present

## 2013-12-26 DIAGNOSIS — I10 Essential (primary) hypertension: Secondary | ICD-10-CM | POA: Diagnosis not present

## 2013-12-26 DIAGNOSIS — R2689 Other abnormalities of gait and mobility: Secondary | ICD-10-CM | POA: Diagnosis not present

## 2014-01-24 DIAGNOSIS — Z79899 Other long term (current) drug therapy: Secondary | ICD-10-CM | POA: Diagnosis not present

## 2014-01-26 DIAGNOSIS — I10 Essential (primary) hypertension: Secondary | ICD-10-CM | POA: Diagnosis not present

## 2014-01-26 DIAGNOSIS — F0390 Unspecified dementia without behavioral disturbance: Secondary | ICD-10-CM | POA: Diagnosis not present

## 2014-01-26 DIAGNOSIS — G20 Parkinson's disease: Secondary | ICD-10-CM | POA: Diagnosis not present

## 2014-01-26 DIAGNOSIS — R600 Localized edema: Secondary | ICD-10-CM | POA: Diagnosis not present

## 2014-02-22 DIAGNOSIS — G20 Parkinson's disease: Secondary | ICD-10-CM | POA: Diagnosis not present

## 2014-02-22 DIAGNOSIS — F17290 Nicotine dependence, other tobacco product, uncomplicated: Secondary | ICD-10-CM | POA: Diagnosis not present

## 2014-02-22 DIAGNOSIS — I10 Essential (primary) hypertension: Secondary | ICD-10-CM | POA: Diagnosis not present

## 2014-02-22 DIAGNOSIS — F0390 Unspecified dementia without behavioral disturbance: Secondary | ICD-10-CM | POA: Diagnosis not present

## 2014-02-27 DIAGNOSIS — Z79899 Other long term (current) drug therapy: Secondary | ICD-10-CM | POA: Diagnosis not present

## 2014-02-28 DIAGNOSIS — G20 Parkinson's disease: Secondary | ICD-10-CM | POA: Diagnosis not present

## 2014-02-28 DIAGNOSIS — F17290 Nicotine dependence, other tobacco product, uncomplicated: Secondary | ICD-10-CM | POA: Diagnosis not present

## 2014-02-28 DIAGNOSIS — I10 Essential (primary) hypertension: Secondary | ICD-10-CM | POA: Diagnosis not present

## 2014-02-28 DIAGNOSIS — F0390 Unspecified dementia without behavioral disturbance: Secondary | ICD-10-CM | POA: Diagnosis not present

## 2014-03-06 DIAGNOSIS — F209 Schizophrenia, unspecified: Secondary | ICD-10-CM | POA: Diagnosis not present

## 2014-03-14 DIAGNOSIS — B351 Tinea unguium: Secondary | ICD-10-CM | POA: Diagnosis not present

## 2014-03-14 DIAGNOSIS — I739 Peripheral vascular disease, unspecified: Secondary | ICD-10-CM | POA: Diagnosis not present

## 2014-03-14 DIAGNOSIS — S90414A Abrasion, right lesser toe(s), initial encounter: Secondary | ICD-10-CM | POA: Diagnosis not present

## 2014-03-21 DIAGNOSIS — G20 Parkinson's disease: Secondary | ICD-10-CM | POA: Diagnosis not present

## 2014-03-21 DIAGNOSIS — Z79899 Other long term (current) drug therapy: Secondary | ICD-10-CM | POA: Diagnosis not present

## 2014-03-21 DIAGNOSIS — R600 Localized edema: Secondary | ICD-10-CM | POA: Diagnosis not present

## 2014-03-27 DIAGNOSIS — Z79899 Other long term (current) drug therapy: Secondary | ICD-10-CM | POA: Diagnosis not present

## 2014-04-04 DIAGNOSIS — S90414D Abrasion, right lesser toe(s), subsequent encounter: Secondary | ICD-10-CM | POA: Diagnosis not present

## 2014-04-25 DIAGNOSIS — Z79899 Other long term (current) drug therapy: Secondary | ICD-10-CM | POA: Diagnosis not present

## 2014-05-23 DIAGNOSIS — B351 Tinea unguium: Secondary | ICD-10-CM | POA: Diagnosis not present

## 2014-05-23 DIAGNOSIS — I739 Peripheral vascular disease, unspecified: Secondary | ICD-10-CM | POA: Diagnosis not present

## 2014-05-29 DIAGNOSIS — F209 Schizophrenia, unspecified: Secondary | ICD-10-CM | POA: Diagnosis not present

## 2014-05-31 DIAGNOSIS — Z79899 Other long term (current) drug therapy: Secondary | ICD-10-CM | POA: Diagnosis not present

## 2014-06-06 DIAGNOSIS — Z79899 Other long term (current) drug therapy: Secondary | ICD-10-CM | POA: Diagnosis not present

## 2014-06-20 DIAGNOSIS — I10 Essential (primary) hypertension: Secondary | ICD-10-CM | POA: Diagnosis not present

## 2014-06-20 DIAGNOSIS — F0390 Unspecified dementia without behavioral disturbance: Secondary | ICD-10-CM | POA: Diagnosis not present

## 2014-06-20 DIAGNOSIS — G20 Parkinson's disease: Secondary | ICD-10-CM | POA: Diagnosis not present

## 2014-06-20 DIAGNOSIS — F17290 Nicotine dependence, other tobacco product, uncomplicated: Secondary | ICD-10-CM | POA: Diagnosis not present

## 2014-06-26 DIAGNOSIS — Z79899 Other long term (current) drug therapy: Secondary | ICD-10-CM | POA: Diagnosis not present

## 2014-06-27 ENCOUNTER — Encounter (HOSPITAL_COMMUNITY): Payer: Self-pay

## 2014-06-27 ENCOUNTER — Emergency Department (HOSPITAL_COMMUNITY)
Admission: EM | Admit: 2014-06-27 | Discharge: 2014-06-27 | Disposition: A | Discharge status: Home / Self Care | Payer: Medicare Other | Attending: Emergency Medicine | Admitting: Emergency Medicine

## 2014-06-27 DIAGNOSIS — Z87448 Personal history of other diseases of urinary system: Secondary | ICD-10-CM | POA: Insufficient documentation

## 2014-06-27 DIAGNOSIS — R7989 Other specified abnormal findings of blood chemistry: Secondary | ICD-10-CM | POA: Diagnosis not present

## 2014-06-27 DIAGNOSIS — F0280 Dementia in other diseases classified elsewhere without behavioral disturbance: Secondary | ICD-10-CM | POA: Diagnosis not present

## 2014-06-27 DIAGNOSIS — R899 Unspecified abnormal finding in specimens from other organs, systems and tissues: Secondary | ICD-10-CM

## 2014-06-27 DIAGNOSIS — I10 Essential (primary) hypertension: Secondary | ICD-10-CM | POA: Insufficient documentation

## 2014-06-27 DIAGNOSIS — Z0001 Encounter for general adult medical examination with abnormal findings: Secondary | ICD-10-CM | POA: Diagnosis not present

## 2014-06-27 DIAGNOSIS — Z872 Personal history of diseases of the skin and subcutaneous tissue: Secondary | ICD-10-CM | POA: Diagnosis not present

## 2014-06-27 DIAGNOSIS — Z79899 Other long term (current) drug therapy: Secondary | ICD-10-CM | POA: Insufficient documentation

## 2014-06-27 DIAGNOSIS — G3183 Dementia with Lewy bodies: Secondary | ICD-10-CM | POA: Diagnosis not present

## 2014-06-27 DIAGNOSIS — Z720 Tobacco use: Secondary | ICD-10-CM | POA: Diagnosis not present

## 2014-06-27 DIAGNOSIS — E162 Hypoglycemia, unspecified: Secondary | ICD-10-CM | POA: Diagnosis present

## 2014-06-27 LAB — CBC WITH DIFFERENTIAL/PLATELET
Basophils Absolute: 0 10*3/uL (ref 0.0–0.1)
Basophils Relative: 0 % (ref 0–1)
Eosinophils Absolute: 0 10*3/uL (ref 0.0–0.7)
Eosinophils Relative: 0 % (ref 0–5)
HCT: 45.2 % (ref 39.0–52.0)
Hemoglobin: 14.2 g/dL (ref 13.0–17.0)
Lymphocytes Relative: 38 % (ref 12–46)
Lymphs Abs: 2.5 10*3/uL (ref 0.7–4.0)
MCH: 28.6 pg (ref 26.0–34.0)
MCHC: 31.4 g/dL (ref 30.0–36.0)
MCV: 91.1 fL (ref 78.0–100.0)
Monocytes Absolute: 0.3 10*3/uL (ref 0.1–1.0)
Monocytes Relative: 5 % (ref 3–12)
Neutro Abs: 3.7 10*3/uL (ref 1.7–7.7)
Neutrophils Relative %: 57 % (ref 43–77)
Platelets: 193 10*3/uL (ref 150–400)
RBC: 4.96 MIL/uL (ref 4.22–5.81)
RDW: 14.2 % (ref 11.5–15.5)
WBC: 6.5 10*3/uL (ref 4.0–10.5)

## 2014-06-27 LAB — BASIC METABOLIC PANEL
Anion gap: 8 (ref 5–15)
BUN: 19 mg/dL (ref 6–20)
CO2: 35 mmol/L — ABNORMAL HIGH (ref 22–32)
Calcium: 8.9 mg/dL (ref 8.9–10.3)
Chloride: 97 mmol/L — ABNORMAL LOW (ref 101–111)
Creatinine, Ser: 1.42 mg/dL — ABNORMAL HIGH (ref 0.61–1.24)
GFR calc Af Amer: 59 mL/min — ABNORMAL LOW (ref >60)
GFR calc non Af Amer: 51 mL/min — ABNORMAL LOW (ref >60)
Glucose, Bld: 107 mg/dL — ABNORMAL HIGH (ref 70–99)
Potassium: 3.5 mmol/L (ref 3.5–5.1)
Sodium: 140 mmol/L (ref 135–145)

## 2014-06-27 LAB — CBG MONITORING, ED: GLUCOSE-CAPILLARY: 108 mg/dL — AB (ref 70–99)

## 2014-06-27 NOTE — ED Provider Notes (Signed)
CSN: [id number]     Arrival date & time 06/27/14  1147 History   None    Chief Complaint  Patient presents with  . Hypoglycemia     (Consider location/radiation/quality/duration/timing/severity/associated sxs/prior Treatment) HPI   63yM brought in for evaluation of incidentally noted hypoglycemia and elevated bicarb. Noted on what appears to have been routine testing. Pt with hx of MR. Not able to provide useful history. Caretaker present. Reports pt has seemed to be at his baseline for past several days including currently. No recent med changes that she is aware of.   Past Medical History  Diagnosis Date  . Mental retardation   . Renal insufficiency   . Parkinson's disease   . Dementia   . Psychosis   . Hypertension   . Cellulitis   . Edema    History reviewed. No pertinent past surgical history. No family history on file. History  Substance Use Topics  . Smoking status: Current Every Day Smoker  . Smokeless tobacco: Not on file  . Alcohol Use: No    Review of Systems  Level 5 caveat because pt is nonverbal.   Allergies  Review of patient's allergies indicates no known allergies.  Home Medications   Prior to Admission medications   Medication Sig Start Date End Date Taking? Authorizing Provider  acetaminophen (Q-PAP) 500 MG tablet Take 500 mg by mouth every 6 (six) hours as needed. For pain    Historical Provider, MD  busPIRone (BUSPAR) 15 MG tablet Take 15 mg by mouth 2 (two) times daily.    Historical Provider, MD  carbidopa-levodopa (SINEMET IR) 25-100 MG per tablet Take 2 tablets by mouth 4 (four) times daily.    Historical Provider, MD  clonazePAM (KLONOPIN) 0.5 MG tablet Take 0.5 mg by mouth daily as needed. For agitation    Historical Provider, MD  cloZAPine (CLOZARIL) 100 MG tablet Take 100 mg by mouth at bedtime.    Historical Provider, MD  donepezil (ARICEPT) 10 MG tablet Take 10 mg by mouth daily.    Historical Provider, MD  magnesium oxide (MAG-OX) 400  MG tablet Take 400 mg by mouth 2 (two) times daily.    Historical Provider, MD  potassium chloride SA (K-DUR,KLOR-CON) 20 MEQ tablet Take 40 mEq by mouth 2 (two) times daily.    Historical Provider, MD  psyllium (REGULOID) 0.52 G capsule Take 1.04 g by mouth every morning.    Historical Provider, MD  torsemide (DEMADEX) 20 MG tablet Take 40 mg by mouth 2 (two) times daily.    Historical Provider, MD  vancomycin (VANCOCIN) 1 GM/200ML SOLN Inject 200 mLs (1,000 mg total) into the vein every 12 (twelve) hours. Pharmacy to monitor vancomycin level and adjust dose 09/18/11   Tesfaye Fanta, MD   BP 142/110 mmHg  Pulse 81  Temp(Src) 99.1 F (37.3 C) (Oral)  Resp 20  Ht 5\' 8"  (1.727 m)  Wt 165 lb (74.844 kg)  BMI 25.09 kg/m2  SpO2 100% Physical Exam  Constitutional: He appears well-developed and well-nourished. No distress.  Laying in bed. NAD. Essentially nonverbal. Will respond to questioning, but answers non intelligible to myself.   HENT:  Head: Normocephalic and atraumatic.  Eyes: Conjunctivae are normal. Right eye exhibits no discharge. Left eye exhibits no discharge.  Neck: Neck supple.  Cardiovascular: Normal rate, regular rhythm and normal heart sounds.  Exam reveals no gallop and no friction rub.   No murmur heard. Pulmonary/Chest: Effort normal and breath sounds normal. No respiratory distress.  Abdominal: Soft. He exhibits no distension. There is no tenderness.  Musculoskeletal: He exhibits no edema or tenderness.  Neurological: He is alert.  Skin: Skin is warm and dry.  Psychiatric: He has a normal mood and affect. His behavior is normal. Thought content normal.  Nursing note and vitals reviewed.   ED Course  Procedures (including critical care time) Labs Review Labs Reviewed  BASIC METABOLIC PANEL - Abnormal; Notable for the following:    Chloride 97 (*)    CO2 35 (*)    Glucose, Bld 107 (*)    Creatinine, Ser 1.42 (*)    GFR calc non Af Amer 51 (*)    GFR calc Af Amer  59 (*)    All other components within normal limits  CBG MONITORING, ED - Abnormal; Notable for the following:    Glucose-Capillary 108 (*)    All other components within normal limits  CBC WITH DIFFERENTIAL/PLATELET    Imaging Review No results found.   EKG Interpretation None      MDM   Final diagnoses:  None    63yM with recent blood work which noted glucose of 40 and c02 of 38. Per review of records, chronic bicarb elevation. Glucose has since normalized. Not on meds for DM. Per caregiver at bedside, pt has been acting like normal self. A1C 6.1. I do not feel further testing indicated.     Virgel Manifold, MD 06/27/14 [phone]

## 2014-06-27 NOTE — ED Notes (Signed)
Pt resident of [facility] and staff reports that he had blood work performed yesterday and glucose was 40, c02 38.  Other labs sent with pt- see pt's chart.

## 2014-07-05 DIAGNOSIS — E162 Hypoglycemia, unspecified: Secondary | ICD-10-CM | POA: Diagnosis not present

## 2014-07-05 DIAGNOSIS — G20 Parkinson's disease: Secondary | ICD-10-CM | POA: Diagnosis not present

## 2014-07-05 DIAGNOSIS — I10 Essential (primary) hypertension: Secondary | ICD-10-CM | POA: Diagnosis not present

## 2014-07-05 DIAGNOSIS — R600 Localized edema: Secondary | ICD-10-CM | POA: Diagnosis not present

## 2014-07-06 DIAGNOSIS — E162 Hypoglycemia, unspecified: Secondary | ICD-10-CM | POA: Diagnosis not present

## 2014-07-06 DIAGNOSIS — G20 Parkinson's disease: Secondary | ICD-10-CM | POA: Diagnosis not present

## 2014-07-06 DIAGNOSIS — I10 Essential (primary) hypertension: Secondary | ICD-10-CM | POA: Diagnosis not present

## 2014-07-06 DIAGNOSIS — R600 Localized edema: Secondary | ICD-10-CM | POA: Diagnosis not present

## 2014-07-18 DIAGNOSIS — Z79899 Other long term (current) drug therapy: Secondary | ICD-10-CM | POA: Diagnosis not present

## 2014-07-18 DIAGNOSIS — G20 Parkinson's disease: Secondary | ICD-10-CM | POA: Diagnosis not present

## 2014-07-18 DIAGNOSIS — R600 Localized edema: Secondary | ICD-10-CM | POA: Diagnosis not present

## 2014-07-26 DIAGNOSIS — Z79899 Other long term (current) drug therapy: Secondary | ICD-10-CM | POA: Diagnosis not present

## 2014-08-02 ENCOUNTER — Telehealth: Payer: Self-pay

## 2014-08-02 NOTE — Telephone Encounter (Signed)
Pt was referred by Dr. Legrand Rams for screening colonoscopy. [facility]) . She will fax the med list and copies of insurance cards.

## 2014-08-04 DIAGNOSIS — B351 Tinea unguium: Secondary | ICD-10-CM | POA: Diagnosis not present

## 2014-08-04 DIAGNOSIS — I739 Peripheral vascular disease, unspecified: Secondary | ICD-10-CM | POA: Diagnosis not present

## 2014-08-11 DIAGNOSIS — E349 Endocrine disorder, unspecified: Secondary | ICD-10-CM | POA: Diagnosis not present

## 2014-08-21 DIAGNOSIS — R7302 Impaired glucose tolerance (oral): Secondary | ICD-10-CM | POA: Diagnosis not present

## 2014-08-21 DIAGNOSIS — F209 Schizophrenia, unspecified: Secondary | ICD-10-CM | POA: Diagnosis not present

## 2014-08-30 DIAGNOSIS — E039 Hypothyroidism, unspecified: Secondary | ICD-10-CM | POA: Diagnosis not present

## 2014-08-30 DIAGNOSIS — E559 Vitamin D deficiency, unspecified: Secondary | ICD-10-CM | POA: Diagnosis not present

## 2014-08-30 DIAGNOSIS — R7302 Impaired glucose tolerance (oral): Secondary | ICD-10-CM | POA: Diagnosis not present

## 2014-08-30 DIAGNOSIS — Z79899 Other long term (current) drug therapy: Secondary | ICD-10-CM | POA: Diagnosis not present

## 2014-09-25 DIAGNOSIS — Z79899 Other long term (current) drug therapy: Secondary | ICD-10-CM | POA: Diagnosis not present

## 2014-10-23 DIAGNOSIS — F79 Unspecified intellectual disabilities: Secondary | ICD-10-CM | POA: Diagnosis not present

## 2014-10-23 DIAGNOSIS — R7309 Other abnormal glucose: Secondary | ICD-10-CM | POA: Diagnosis not present

## 2014-10-23 DIAGNOSIS — F0390 Unspecified dementia without behavioral disturbance: Secondary | ICD-10-CM | POA: Diagnosis not present

## 2014-10-23 DIAGNOSIS — G20 Parkinson's disease: Secondary | ICD-10-CM | POA: Diagnosis not present

## 2014-10-23 DIAGNOSIS — Z23 Encounter for immunization: Secondary | ICD-10-CM | POA: Diagnosis not present

## 2014-10-24 DIAGNOSIS — I739 Peripheral vascular disease, unspecified: Secondary | ICD-10-CM | POA: Diagnosis not present

## 2014-10-24 DIAGNOSIS — L851 Acquired keratosis [keratoderma] palmaris et plantaris: Secondary | ICD-10-CM | POA: Diagnosis not present

## 2014-10-24 DIAGNOSIS — B351 Tinea unguium: Secondary | ICD-10-CM | POA: Diagnosis not present

## 2014-10-25 DIAGNOSIS — H2513 Age-related nuclear cataract, bilateral: Secondary | ICD-10-CM | POA: Diagnosis not present

## 2014-10-25 NOTE — Telephone Encounter (Signed)
Letter was mailed to [facility] on 10/20/2014 that pt will need OV prior to scheduling colonoscopy. ( Due to his med list).

## 2014-10-27 DIAGNOSIS — I10 Essential (primary) hypertension: Secondary | ICD-10-CM | POA: Diagnosis not present

## 2014-10-31 DIAGNOSIS — Z79899 Other long term (current) drug therapy: Secondary | ICD-10-CM | POA: Diagnosis not present

## 2014-11-09 DIAGNOSIS — I10 Essential (primary) hypertension: Secondary | ICD-10-CM | POA: Diagnosis not present

## 2014-11-10 ENCOUNTER — Encounter: Payer: Self-pay | Admitting: "Endocrinology

## 2014-11-21 ENCOUNTER — Ambulatory Visit: Payer: Self-pay | Admitting: "Endocrinology

## 2014-11-27 DIAGNOSIS — Z79899 Other long term (current) drug therapy: Secondary | ICD-10-CM | POA: Diagnosis not present

## 2014-12-12 DIAGNOSIS — F209 Schizophrenia, unspecified: Secondary | ICD-10-CM | POA: Diagnosis not present

## 2014-12-26 ENCOUNTER — Encounter: Payer: Self-pay | Admitting: Gastroenterology

## 2014-12-26 DIAGNOSIS — Z79899 Other long term (current) drug therapy: Secondary | ICD-10-CM | POA: Diagnosis not present

## 2015-01-09 DIAGNOSIS — I739 Peripheral vascular disease, unspecified: Secondary | ICD-10-CM | POA: Diagnosis not present

## 2015-01-09 DIAGNOSIS — L851 Acquired keratosis [keratoderma] palmaris et plantaris: Secondary | ICD-10-CM | POA: Diagnosis not present

## 2015-01-09 DIAGNOSIS — B351 Tinea unguium: Secondary | ICD-10-CM | POA: Diagnosis not present

## 2015-01-15 DIAGNOSIS — R600 Localized edema: Secondary | ICD-10-CM | POA: Diagnosis not present

## 2015-01-15 DIAGNOSIS — G20 Parkinson's disease: Secondary | ICD-10-CM | POA: Diagnosis not present

## 2015-01-15 DIAGNOSIS — Z79899 Other long term (current) drug therapy: Secondary | ICD-10-CM | POA: Diagnosis not present

## 2015-01-22 DIAGNOSIS — F0390 Unspecified dementia without behavioral disturbance: Secondary | ICD-10-CM | POA: Diagnosis not present

## 2015-01-22 DIAGNOSIS — F79 Unspecified intellectual disabilities: Secondary | ICD-10-CM | POA: Diagnosis not present

## 2015-01-22 DIAGNOSIS — G20 Parkinson's disease: Secondary | ICD-10-CM | POA: Diagnosis not present

## 2015-01-22 DIAGNOSIS — I10 Essential (primary) hypertension: Secondary | ICD-10-CM | POA: Diagnosis not present

## 2015-01-29 DIAGNOSIS — R32 Unspecified urinary incontinence: Secondary | ICD-10-CM | POA: Diagnosis not present

## 2015-01-29 DIAGNOSIS — G20 Parkinson's disease: Secondary | ICD-10-CM | POA: Diagnosis not present

## 2015-01-29 DIAGNOSIS — F0390 Unspecified dementia without behavioral disturbance: Secondary | ICD-10-CM | POA: Diagnosis not present

## 2015-01-29 DIAGNOSIS — I10 Essential (primary) hypertension: Secondary | ICD-10-CM | POA: Diagnosis not present

## 2015-01-29 DIAGNOSIS — F29 Unspecified psychosis not due to a substance or known physiological condition: Secondary | ICD-10-CM | POA: Diagnosis not present

## 2015-01-29 DIAGNOSIS — F79 Unspecified intellectual disabilities: Secondary | ICD-10-CM | POA: Diagnosis not present

## 2015-01-29 DIAGNOSIS — R609 Edema, unspecified: Secondary | ICD-10-CM | POA: Diagnosis not present

## 2015-01-30 ENCOUNTER — Encounter: Payer: Self-pay | Admitting: Nurse Practitioner

## 2015-01-30 ENCOUNTER — Ambulatory Visit (INDEPENDENT_AMBULATORY_CARE_PROVIDER_SITE_OTHER): Payer: Medicare Other | Admitting: Nurse Practitioner

## 2015-01-30 VITALS — BP 136/82 | HR 75 | Temp 98.5°F | Wt 167.0 lb

## 2015-01-30 DIAGNOSIS — Z1211 Encounter for screening for malignant neoplasm of colon: Secondary | ICD-10-CM

## 2015-01-30 NOTE — Progress Notes (Signed)
Primary Care Physician:  Rosita Fire, MD Primary Gastroenterologist:  Dr. Oneida Alar  Chief Complaint  Patient presents with  . set up TCS    HPI:   64 year old male was referred by primary care for first-ever screening colonoscopy. Last evaluated by our office on an 2011. At that point it was noted he has mental retardation and becomes belligerent and violent when he is unable to you when he wants to and will still food from other patients trace. At that time is deemed she cannot tolerate a prep nor clear liquid diet due to this pattern. He was deferred for his colonoscopy because of inability to take the prep and follow instructions without bile and belligerent behavior and without this colonoscopy cannot be adequately completed. He is once again referred by primary care.  Today he is accompanied by a facility representative. He has MR and is minimally communicative. States he's fine today. Denies abdominal pain, N/V, hematochezia, melena.  Facility rep states they did not tell her anyting related to symptoms. No other noted upper or lower GI symptoms.  Past Medical History  Diagnosis Date  . Mental retardation   . Renal insufficiency   . Parkinson's disease ([place])   . Dementia   . Psychosis   . Hypertension   . Cellulitis   . Edema   . Hypoglycemia     No past surgical history on file.  Current Outpatient Prescriptions  Medication Sig Dispense Refill  . acetaminophen (Q-PAP) 500 MG tablet Take 500 mg by mouth every 6 (six) hours as needed. For pain    . busPIRone (BUSPAR) 15 MG tablet Take 15 mg by mouth 2 (two) times daily.    . carbidopa-levodopa (SINEMET IR) 25-100 MG per tablet Take 2 tablets by mouth 4 (four) times daily.    . clonazePAM (KLONOPIN) 0.5 MG tablet Take 0.5 mg by mouth daily as needed. For agitation    . cloZAPine (CLOZARIL) 100 MG tablet Take 100 mg by mouth at bedtime.    . donepezil (ARICEPT) 10 MG tablet Take 10 mg by mouth daily.    . magnesium oxide  (MAG-OX) 400 MG tablet Take 400 mg by mouth 2 (two) times daily.    . potassium chloride SA (K-DUR,KLOR-CON) 20 MEQ tablet Take 40 mEq by mouth 2 (two) times daily.    Marland Kitchen torsemide (DEMADEX) 20 MG tablet Take 40 mg by mouth 2 (two) times daily.    . psyllium (REGULOID) 0.52 G capsule Take 1.04 g by mouth every morning.    . vancomycin (VANCOCIN) 1 GM/200ML SOLN Inject 200 mLs (1,000 mg total) into the vein every 12 (twelve) hours. Pharmacy to monitor vancomycin level and adjust dose (Patient not taking: Reported on 06/27/2014) 4000 mL 14   No current facility-administered medications for this visit.    Allergies as of 01/30/2015 - Review Complete 01/30/2015  Allergen Reaction Noted  . Penicillins  11/10/2014    No family history on file.  Social History   Social History  . Marital Status: Widowed    Spouse Name: N/A  . Number of Children: N/A  . Years of Education: N/A   Occupational History  . Not on file.   Social History Main Topics  . Smoking status: Current Every Day Smoker  . Smokeless tobacco: Not on file  . Alcohol Use: No  . Drug Use: No  . Sexual Activity: Not on file   Other Topics Concern  . Not on file   Social History Narrative  Review of Systems: 10-point ROS negative except as per HPI.    Physical Exam: BP 136/82 mmHg  Pulse 75  Temp(Src) 98.5 F (36.9 C)  Ht   Wt 167 lb (75.751 kg) General:   Alert, pleasant and cooperative. Well-nourished.  Head:  Normocephalic and atraumatic. Eyes:  Without icterus, sclera clear and conjunctiva pink.  Ears:  Normal auditory acuity. Cardiovascular:  S1, S2 present without murmurs appreciated. Extremities without clubbing or edema. Respiratory:  Clear to auscultation bilaterally. No wheezes, rales, or rhonchi. No distress.  Gastrointestinal:  +BS, soft, non-tender and non-distended. No HSM noted. No guarding or rebound. No masses appreciated.  Rectal:  Deferred  Neurologic:  Alert and oriented x4;  grossly  normal neurologically. Psych:  Alert and cooperative. Normal mood and affect. Heme/Lymph/Immune: No excessive bruising noted.    01/30/2015 1:37 PM

## 2015-01-30 NOTE — Patient Instructions (Signed)
1. We will contact her facility to discuss the ability to tolerate a bowel prep for colonoscopy. 2. We'll discuss the situation with Dr. fields so we can come to a decision. 3. If we are unable to complete a colonoscopy due to an inability to tolerate colon prep, we can consider other testing such as: Cologuard 4. We will notify you of the plan once we come to a decision.

## 2015-02-05 ENCOUNTER — Telehealth: Payer: Self-pay | Admitting: Nurse Practitioner

## 2015-02-05 NOTE — Progress Notes (Signed)
CC'D TO PCP °

## 2015-02-05 NOTE — Telephone Encounter (Signed)
Please proceed with scheduling this patient for initial screening TCS with 25 mg pre-procedure Phenergan with [facility]. Please notify the group home director (Mrs Huel Cote, [phone]) with schedule information and information for prep. Let me know if any questions.

## 2015-02-05 NOTE — Progress Notes (Signed)
Had an extensive discussion with [name] who runs the home that the patient resides in. She states his demeanor is much improved from last attempt at colonoscopy. She feels he would be able to 1) take the prep 2) tolerate the prep 3) not steal solid foods from other residents. She states the staff would prep him appropriately based on our instructions about clear liquid diet, prep process, etc. She feels he would be able to complete a colonoscopy at this time. Will proceed with scheduling.

## 2015-02-05 NOTE — Assessment & Plan Note (Signed)
64 year old male is due for screening colonoscopy. He is never had a colonoscopy before. At last attempt was determined that the patient becomes combative and argumentative when he is not allowed to have his way N/A doubted his ability to not still food from other patients while doing the prep. For that reason he was deemed unable to tolerate prep and colonoscopy was not able to be completed. Had an extensive discussion with a group home manager (see note associated with this visit) and she feels that his demeanor is much improved from 5 years ago and he would be able to tolerate the prep appropriately. This point we'll move forward with colonoscopy. She states that she will notify us if there is any change in something happens where he would not be able to complete the colonoscopy so that we could cancel his procedure.  Proceed with colonoscopy with Dr. Oneida Alar in the near future. The risks, benefits, and alternatives have been discussed in detail with the patient. They state understanding and desire to proceed.   Patient is not on any chronic pain medications. He is on BuSpar twice a day, Klonopin 0.5 mg when necessary. At this point he would like to go to tolerate conscious sedation with 25 mg preprocedure Phenergan.

## 2015-02-06 ENCOUNTER — Other Ambulatory Visit: Payer: Self-pay

## 2015-02-06 DIAGNOSIS — Z1211 Encounter for screening for malignant neoplasm of colon: Secondary | ICD-10-CM

## 2015-02-06 MED ORDER — PEG 3350-KCL-NA BICARB-NACL 420 G PO SOLR: 4000.0000 mL | Freq: Once | ORAL | Status: DC

## 2015-02-06 MED ORDER — PHOSPHATE LAXATIVE 2.7-7.2 GM/15ML PO SOLN
15.0000 mL | Freq: Once | ORAL | Status: DC
Start: 2015-02-06 — End: 2016-01-08

## 2015-02-06 MED ORDER — BISACODYL 5 MG PO TBEC: 5.0000 mg | DELAYED_RELEASE_TABLET | Freq: Every day | ORAL | Status: DC | PRN

## 2015-02-06 NOTE — Telephone Encounter (Signed)
Mrs. [name] called office back and states that pt doesn't have a POA.  Pt is able to sign for his consent and is his own Medical POA.

## 2015-02-06 NOTE — Telephone Encounter (Signed)
Pt is set for 02/21/2015 @ 1430.  Faxed instructions to RX [facility].   Called [name] and LMOM to call office back to verify who is Pt POA.

## 2015-02-21 ENCOUNTER — Other Ambulatory Visit: Payer: Self-pay

## 2015-02-27 DIAGNOSIS — Z79899 Other long term (current) drug therapy: Secondary | ICD-10-CM | POA: Diagnosis not present

## 2015-03-12 ENCOUNTER — Ambulatory Visit (HOSPITAL_COMMUNITY)
Admission: RE | Admit: 2015-03-12 | Discharge: 2015-03-12 | Disposition: A | Discharge status: Home / Self Care | Payer: Medicare Other | Source: Ambulatory Visit | Attending: Gastroenterology | Admitting: Gastroenterology

## 2015-03-12 ENCOUNTER — Encounter (HOSPITAL_COMMUNITY)
Admission: RE | Disposition: A | Discharge status: Home / Self Care | Payer: Self-pay | Source: Ambulatory Visit | Attending: Gastroenterology

## 2015-03-12 ENCOUNTER — Encounter (HOSPITAL_COMMUNITY): Payer: Self-pay

## 2015-03-12 DIAGNOSIS — G20 Parkinson's disease: Secondary | ICD-10-CM | POA: Diagnosis not present

## 2015-03-12 DIAGNOSIS — F17200 Nicotine dependence, unspecified, uncomplicated: Secondary | ICD-10-CM | POA: Diagnosis not present

## 2015-03-12 DIAGNOSIS — K648 Other hemorrhoids: Secondary | ICD-10-CM | POA: Insufficient documentation

## 2015-03-12 DIAGNOSIS — I10 Essential (primary) hypertension: Secondary | ICD-10-CM | POA: Diagnosis not present

## 2015-03-12 DIAGNOSIS — E162 Hypoglycemia, unspecified: Secondary | ICD-10-CM | POA: Insufficient documentation

## 2015-03-12 DIAGNOSIS — Z79899 Other long term (current) drug therapy: Secondary | ICD-10-CM | POA: Insufficient documentation

## 2015-03-12 DIAGNOSIS — Z1211 Encounter for screening for malignant neoplasm of colon: Secondary | ICD-10-CM | POA: Diagnosis not present

## 2015-03-12 DIAGNOSIS — Q438 Other specified congenital malformations of intestine: Secondary | ICD-10-CM | POA: Diagnosis not present

## 2015-03-12 DIAGNOSIS — D124 Benign neoplasm of descending colon: Secondary | ICD-10-CM

## 2015-03-12 HISTORY — PX: COLONOSCOPY: SHX5424

## 2015-03-12 SURGERY — COLONOSCOPY
Anesthesia: Moderate Sedation

## 2015-03-12 MED ORDER — MEPERIDINE HCL 100 MG/ML IJ SOLN
INTRAMUSCULAR | Status: DC | PRN
  Administered 2015-03-12: 50 mg via INTRAVENOUS

## 2015-03-12 MED ORDER — MIDAZOLAM HCL 5 MG/5ML IJ SOLN
INTRAMUSCULAR | Status: AC
  Filled 2015-03-12: qty 10

## 2015-03-12 MED ORDER — PROMETHAZINE HCL 25 MG/ML IJ SOLN
25.0000 mg | Freq: Once | INTRAMUSCULAR | Status: AC
  Administered 2015-03-12: 25 mg via INTRAVENOUS

## 2015-03-12 MED ORDER — SODIUM CHLORIDE 0.9 % IJ SOLN
INTRAMUSCULAR | Status: AC
  Filled 2015-03-12: qty 3

## 2015-03-12 MED ORDER — SODIUM CHLORIDE 0.9 % IV SOLN
INTRAVENOUS | Status: DC
  Administered 2015-03-12: 14:00:00 via INTRAVENOUS

## 2015-03-12 MED ORDER — MIDAZOLAM HCL 5 MG/5ML IJ SOLN
INTRAMUSCULAR | Status: DC | PRN
  Administered 2015-03-12: 2 mg via INTRAVENOUS

## 2015-03-12 MED ORDER — MEPERIDINE HCL 100 MG/ML IJ SOLN
INTRAMUSCULAR | Status: AC
  Filled 2015-03-12: qty 2

## 2015-03-12 MED ORDER — PROMETHAZINE HCL 25 MG/ML IJ SOLN
INTRAMUSCULAR | Status: AC
  Filled 2015-03-12: qty 1

## 2015-03-12 MED ORDER — STERILE WATER FOR IRRIGATION IR SOLN
Status: DC | PRN
  Administered 2015-03-12: 14:00:00

## 2015-03-12 NOTE — H&P (Signed)
Primary Care Physician:  Rosita Fire, MD Primary Gastroenterologist:  Dr. Oneida Alar  Pre-Procedure History & Physical: HPI:  [name] is a 65 y.o. male here for [place].   Past Medical History  Diagnosis Date  . Mental retardation   . Renal insufficiency   . Parkinson's disease ([place])   . Dementia   . Psychosis   . Hypertension   . Cellulitis   . Edema   . Hypoglycemia     History reviewed. No pertinent past surgical history.  Prior to Admission medications   Medication Sig Start Date End Date Taking? Authorizing Provider  acetaminophen (Q-PAP) 500 MG tablet Take 500 mg by mouth every 6 (six) hours as needed. For pain   Yes Historical Provider, MD  bisacodyl (BISACODYL) 5 MG EC tablet Take 1 tablet (5 mg total) by mouth daily as needed for moderate constipation. 02/06/15  Yes Carlis Stable, NP  busPIRone (BUSPAR) 15 MG tablet Take 15 mg by mouth 2 (two) times daily.   Yes Historical Provider, MD  carbidopa-levodopa (SINEMET IR) 25-100 MG per tablet Take 2 tablets by mouth 4 (four) times daily.   Yes Historical Provider, MD  clonazePAM (KLONOPIN) 0.5 MG tablet Take 0.5 mg by mouth daily as needed. For agitation   Yes Historical Provider, MD  cloZAPine (CLOZARIL) 100 MG tablet Take 100 mg by mouth at bedtime.   Yes Historical Provider, MD  donepezil (ARICEPT) 10 MG tablet Take 10 mg by mouth daily.   Yes Historical Provider, MD  magnesium oxide (MAG-OX) 400 MG tablet Take 400 mg by mouth 2 (two) times daily.   Yes Historical Provider, MD  phosphate laxative (FLEET) 2.7-7.2 GM/15ML solution Take 15 mLs by mouth once. 02/06/15  Yes Carlis Stable, NP  polyethylene glycol-electrolytes (NULYTELY/GOLYTELY) 420 G solution Take 4,000 mLs by mouth once. 02/06/15  Yes Carlis Stable, NP  potassium chloride SA (K-DUR,KLOR-CON) 20 MEQ tablet Take 40 mEq by mouth 2 (two) times daily.   Yes Historical Provider, MD  torsemide (DEMADEX) 20 MG tablet Take 40 mg by mouth 2 (two)  times daily.   Yes Historical Provider, MD    Allergies as of 02/06/2015 - Review Complete 02/05/2015  Allergen Reaction Noted  . Penicillins  11/10/2014    Family History  Problem Relation Age of Onset  . Colon cancer      Unknown family history    Social History   Social History  . Marital Status: Widowed    Spouse Name: N/A  . Number of Children: N/A  . Years of Education: N/A   Occupational History  . Not on file.   Social History Main Topics  . Smoking status: Current Every Day Smoker  . Smokeless tobacco: Never Used  . Alcohol Use: No  . Drug Use: No  . Sexual Activity: Not on file   Other Topics Concern  . Not on file   Social History Narrative    Review of Systems: See HPI, otherwise negative ROS   Physical Exam: BP 196/96 mmHg  Pulse 75  Temp(Src) 97.9 F (36.6 C) (Oral)  Resp 15  Ht 5\' 9"  (1.753 m)  Wt 200 lb (90.719 kg)  BMI 29.52 kg/m2  SpO2 98% General:   Alert,  pleasant and cooperative in NAD Head:  Normocephalic and atraumatic. Neck:  Supple; Lungs:  Clear throughout to auscultation.    Heart:  Regular rate and rhythm. Abdomen:  Soft, nontender and nondistended. Normal bowel sounds, without guarding, and without  rebound.   Neurologic:  Alert and  oriented x4;  grossly normal neurologically.  Impression/Plan:     SCREENING  Plan:  1. TCS TODAY

## 2015-03-12 NOTE — Op Note (Signed)
[name], [id number]   COLONOSCOPY PROCEDURE REPORT  PATIENT: Jeremiah, Coleman  MR#: [id number] BIRTHDATE: 1950/05/28 , 73  yrs. old GENDER: male ENDOSCOPIST: Danie Binder, MD REFERRED [id number] Fanta, M.D. PROCEDURE DATE:  04/05/15 PROCEDURE:   Colonoscopy with cold biopsy polypectomy INDICATIONS:average risk patient for colon cancer. MEDICATIONS: Promethazine (Phenergan) 25 mg IV, Demerol 50 mg IV, and Versed 2 mg IV  DESCRIPTION OF PROCEDURE:    Physical exam was performed.  Informed consent was obtained from the patient after explaining the benefits, risks, and alternatives to procedure.  The patient was connected to monitor and placed in left lateral position. Continuous oxygen was provided by nasal cannula and IV medicine administered through an indwelling cannula.  After administration of sedation and rectal exam, the patients rectum was intubated and the [id number]  colonoscope was advanced under direct visualization to the cecum.  The scope was removed slowly by carefully examining the color, texture, anatomy, and integrity mucosa on the way out.  The patient was recovered in endoscopy and discharged home in satisfactory condition. Estimated blood loss is zero unless otherwise noted in this procedure report.    COLON FINDINGS: The colon was redundant.  Manual abdominal counter-pressure was used to reach the cecum, A sessile polyp measuring 3 mm in size was found in the descending colon.  A polypectomy was performed with cold forceps.  , The examination was otherwise normal.  , and Small internal hemorrhoids were found.  PREP QUALITY: good.  CECAL W/D TIME: 16       minutes COMPLICATIONS: None  ENDOSCOPIC IMPRESSION: 1.   The LEFT colon IS SLIGHTLY redundant 2.   ONE COLON POLYP REMOVED 3.   Small internal hemorrhoids  RECOMMENDATIONS: AWAIT BIOPSY HIGH FIBER DIET NEXT TCS IN 5-10  YEARS     _______________________________ eSignedDanie Binder, MD 05-Apr-2015 3:01 PM    CPT CODES: ICD CODES:  The ICD and CPT codes recommended by this software are interpretations from the data that the clinical staff has captured with the software.  The verification of the translation of this report to the ICD and CPT codes and modifiers is the sole responsibility of the health care institution and practicing physician where this report was generated.  [place]. will not be held responsible for the validity of the ICD and CPT codes included on this report.  AMA assumes no liability for data contained or not contained herein. CPT is a Designer, television/film set of the [employer].

## 2015-03-12 NOTE — Progress Notes (Signed)
REVIEWED-NO ADDITIONAL RECOMMENDATIONS. 

## 2015-03-12 NOTE — Discharge Instructions (Signed)
HE had 1 small polyp removed FROM HIS DESCENDING COLON. HE HAS SMALL internal hemorrhoids.   FOLLOW A HIGH FIBER DIET. AVOID ITEMS THAT CAUSE BLOATING & GAS. SEE INFO BELOW.  HIS BIOPSY RESULTS WILL BE AVAILABLE IN 10-14 DAYS.   Next colonoscopy in 5-10years.    Colonoscopy Care After Read the instructions outlined below and refer to this sheet in the next week. These discharge instructions provide you with general information on caring for yourself after you leave the hospital. While your treatment has been planned according to the most current medical practices available, unavoidable complications occasionally occur. If you have any problems or questions after discharge, call DR. Lillieann Pavlich, [phone].  ACTIVITY  You may resume your regular activity, but move at a slower pace for the next 24 hours.   Take frequent rest periods for the next 24 hours.   Walking will help get rid of the air and reduce the bloated feeling in your belly (abdomen).   No driving for 24 hours (because of the medicine (anesthesia) used during the test).   You may shower.   Do not sign any important legal documents or operate any machinery for 24 hours (because of the anesthesia used during the test).    NUTRITION  Drink plenty of fluids.   You may resume your normal diet as instructed by your doctor.   Begin with a light meal and progress to your normal diet. Heavy or fried foods are harder to digest and may make you feel sick to your stomach (nauseated).   Avoid alcoholic beverages for 24 hours or as instructed.    MEDICATIONS  You may resume your normal medications.   WHAT YOU CAN EXPECT TODAY  Some feelings of bloating in the abdomen.   Passage of more gas than usual.   Spotting of blood in your stool or on the toilet paper  .  IF YOU HAD POLYPS REMOVED DURING THE COLONOSCOPY:  Eat a soft diet IF YOU HAVE NAUSEA, BLOATING, ABDOMINAL PAIN, OR VOMITING.    FINDING OUT THE RESULTS  OF YOUR TEST Not all test results are available during your visit. DR. Oneida Alar WILL CALL YOU WITHIN 7 DAYS OF YOUR PROCEDUE WITH YOUR RESULTS. Do not assume everything is normal if you have not heard from DR. Lavalle Skoda IN ONE WEEK, CALL HER OFFICE AT [phone].  SEEK IMMEDIATE MEDICAL ATTENTION AND CALL THE OFFICE: [phone] IF:  You have more than a spotting of blood in your stool.   Your belly is swollen (abdominal distention).   You are nauseated or vomiting.   You have a temperature over 101F.   You have abdominal pain or discomfort that is severe or gets worse throughout the day.

## 2015-03-14 ENCOUNTER — Encounter (HOSPITAL_COMMUNITY): Payer: Self-pay | Admitting: Gastroenterology

## 2015-03-28 ENCOUNTER — Telehealth: Payer: Self-pay | Admitting: Gastroenterology

## 2015-03-28 DIAGNOSIS — Z23 Encounter for immunization: Secondary | ICD-10-CM | POA: Diagnosis not present

## 2015-03-28 DIAGNOSIS — F0390 Unspecified dementia without behavioral disturbance: Secondary | ICD-10-CM | POA: Diagnosis not present

## 2015-03-28 DIAGNOSIS — G20 Parkinson's disease: Secondary | ICD-10-CM | POA: Diagnosis not present

## 2015-03-28 DIAGNOSIS — I10 Essential (primary) hypertension: Secondary | ICD-10-CM | POA: Diagnosis not present

## 2015-03-28 DIAGNOSIS — F79 Unspecified intellectual disabilities: Secondary | ICD-10-CM | POA: Diagnosis not present

## 2015-03-28 NOTE — Telephone Encounter (Signed)
Please call pt'S FACILITY. He had A simple adenoma removed from hIS colon. FOLLOW A High fiber diet. TCS IN 5-10 YEARS.

## 2015-03-29 NOTE — Telephone Encounter (Signed)
Reminder in epic °

## 2015-03-29 NOTE — Telephone Encounter (Signed)
[name] is aware and I am mailing this info to her for their records.

## 2015-04-02 DIAGNOSIS — Z79899 Other long term (current) drug therapy: Secondary | ICD-10-CM | POA: Diagnosis not present

## 2015-04-03 DIAGNOSIS — F209 Schizophrenia, unspecified: Secondary | ICD-10-CM | POA: Diagnosis not present

## 2015-04-24 DIAGNOSIS — I739 Peripheral vascular disease, unspecified: Secondary | ICD-10-CM | POA: Diagnosis not present

## 2015-04-24 DIAGNOSIS — B351 Tinea unguium: Secondary | ICD-10-CM | POA: Diagnosis not present

## 2015-04-24 DIAGNOSIS — L851 Acquired keratosis [keratoderma] palmaris et plantaris: Secondary | ICD-10-CM | POA: Diagnosis not present

## 2015-04-30 DIAGNOSIS — Z79899 Other long term (current) drug therapy: Secondary | ICD-10-CM | POA: Diagnosis not present

## 2015-05-07 DIAGNOSIS — I10 Essential (primary) hypertension: Secondary | ICD-10-CM | POA: Diagnosis not present

## 2015-05-28 DIAGNOSIS — Z79899 Other long term (current) drug therapy: Secondary | ICD-10-CM | POA: Diagnosis not present

## 2015-06-19 DIAGNOSIS — I10 Essential (primary) hypertension: Secondary | ICD-10-CM | POA: Diagnosis not present

## 2015-06-25 DIAGNOSIS — Z79899 Other long term (current) drug therapy: Secondary | ICD-10-CM | POA: Diagnosis not present

## 2015-06-27 DIAGNOSIS — G20 Parkinson's disease: Secondary | ICD-10-CM | POA: Diagnosis not present

## 2015-06-27 DIAGNOSIS — I10 Essential (primary) hypertension: Secondary | ICD-10-CM | POA: Diagnosis not present

## 2015-06-27 DIAGNOSIS — F79 Unspecified intellectual disabilities: Secondary | ICD-10-CM | POA: Diagnosis not present

## 2015-06-27 DIAGNOSIS — R32 Unspecified urinary incontinence: Secondary | ICD-10-CM | POA: Diagnosis not present

## 2015-07-13 DIAGNOSIS — B351 Tinea unguium: Secondary | ICD-10-CM | POA: Diagnosis not present

## 2015-07-13 DIAGNOSIS — L851 Acquired keratosis [keratoderma] palmaris et plantaris: Secondary | ICD-10-CM | POA: Diagnosis not present

## 2015-07-13 DIAGNOSIS — I739 Peripheral vascular disease, unspecified: Secondary | ICD-10-CM | POA: Diagnosis not present

## 2015-07-26 DIAGNOSIS — Z79899 Other long term (current) drug therapy: Secondary | ICD-10-CM | POA: Diagnosis not present

## 2015-07-30 DIAGNOSIS — R600 Localized edema: Secondary | ICD-10-CM | POA: Diagnosis not present

## 2015-07-30 DIAGNOSIS — I10 Essential (primary) hypertension: Secondary | ICD-10-CM | POA: Diagnosis not present

## 2015-07-30 DIAGNOSIS — G20 Parkinson's disease: Secondary | ICD-10-CM | POA: Diagnosis not present

## 2015-07-30 DIAGNOSIS — Z79899 Other long term (current) drug therapy: Secondary | ICD-10-CM | POA: Diagnosis not present

## 2015-07-31 DIAGNOSIS — F209 Schizophrenia, unspecified: Secondary | ICD-10-CM | POA: Diagnosis not present

## 2015-08-27 DIAGNOSIS — Z79899 Other long term (current) drug therapy: Secondary | ICD-10-CM | POA: Diagnosis not present

## 2015-08-29 DIAGNOSIS — I10 Essential (primary) hypertension: Secondary | ICD-10-CM | POA: Diagnosis not present

## 2015-09-21 DIAGNOSIS — B351 Tinea unguium: Secondary | ICD-10-CM | POA: Diagnosis not present

## 2015-09-21 DIAGNOSIS — L851 Acquired keratosis [keratoderma] palmaris et plantaris: Secondary | ICD-10-CM | POA: Diagnosis not present

## 2015-09-21 DIAGNOSIS — I739 Peripheral vascular disease, unspecified: Secondary | ICD-10-CM | POA: Diagnosis not present

## 2015-09-25 DIAGNOSIS — Z79899 Other long term (current) drug therapy: Secondary | ICD-10-CM | POA: Diagnosis not present

## 2015-10-03 DIAGNOSIS — F0390 Unspecified dementia without behavioral disturbance: Secondary | ICD-10-CM | POA: Diagnosis not present

## 2015-10-03 DIAGNOSIS — F29 Unspecified psychosis not due to a substance or known physiological condition: Secondary | ICD-10-CM | POA: Diagnosis not present

## 2015-10-03 DIAGNOSIS — G20 Parkinson's disease: Secondary | ICD-10-CM | POA: Diagnosis not present

## 2015-10-03 DIAGNOSIS — I10 Essential (primary) hypertension: Secondary | ICD-10-CM | POA: Diagnosis not present

## 2015-10-30 DIAGNOSIS — Z79899 Other long term (current) drug therapy: Secondary | ICD-10-CM | POA: Diagnosis not present

## 2015-11-20 DIAGNOSIS — F209 Schizophrenia, unspecified: Secondary | ICD-10-CM | POA: Diagnosis not present

## 2015-11-26 DIAGNOSIS — Z79899 Other long term (current) drug therapy: Secondary | ICD-10-CM | POA: Diagnosis not present

## 2015-11-30 DIAGNOSIS — L851 Acquired keratosis [keratoderma] palmaris et plantaris: Secondary | ICD-10-CM | POA: Diagnosis not present

## 2015-11-30 DIAGNOSIS — I739 Peripheral vascular disease, unspecified: Secondary | ICD-10-CM | POA: Diagnosis not present

## 2015-11-30 DIAGNOSIS — B351 Tinea unguium: Secondary | ICD-10-CM | POA: Diagnosis not present

## 2015-12-31 DIAGNOSIS — Z79899 Other long term (current) drug therapy: Secondary | ICD-10-CM | POA: Diagnosis not present

## 2016-01-08 ENCOUNTER — Emergency Department (HOSPITAL_COMMUNITY)
Admission: EM | Admit: 2016-01-08 | Discharge: 2016-01-08 | Disposition: A | Discharge status: Home / Self Care | Payer: Medicare Other | Attending: Emergency Medicine | Admitting: Emergency Medicine

## 2016-01-08 ENCOUNTER — Encounter (HOSPITAL_COMMUNITY): Payer: Self-pay | Admitting: Emergency Medicine

## 2016-01-08 DIAGNOSIS — R4182 Altered mental status, unspecified: Secondary | ICD-10-CM | POA: Insufficient documentation

## 2016-01-08 DIAGNOSIS — G20 Parkinson's disease: Secondary | ICD-10-CM | POA: Insufficient documentation

## 2016-01-08 DIAGNOSIS — I10 Essential (primary) hypertension: Secondary | ICD-10-CM | POA: Insufficient documentation

## 2016-01-08 DIAGNOSIS — F17200 Nicotine dependence, unspecified, uncomplicated: Secondary | ICD-10-CM | POA: Diagnosis not present

## 2016-01-08 DIAGNOSIS — R402411 Glasgow coma scale score 13-15, in the field [EMT or ambulance]: Secondary | ICD-10-CM | POA: Diagnosis not present

## 2016-01-08 LAB — CBC WITH DIFFERENTIAL/PLATELET
Basophils Absolute: 0 10*3/uL (ref 0.0–0.1)
Basophils Relative: 0 %
Eosinophils Absolute: 0 10*3/uL (ref 0.0–0.7)
Eosinophils Relative: 0 %
HCT: 45.7 % (ref 39.0–52.0)
Hemoglobin: 14.7 g/dL (ref 13.0–17.0)
Lymphocytes Relative: 28 %
Lymphs Abs: 1.7 10*3/uL (ref 0.7–4.0)
MCH: 29.3 pg (ref 26.0–34.0)
MCHC: 32.2 g/dL (ref 30.0–36.0)
MCV: 91 fL (ref 78.0–100.0)
Monocytes Absolute: 0.4 10*3/uL (ref 0.1–1.0)
Monocytes Relative: 7 %
Neutro Abs: 3.9 10*3/uL (ref 1.7–7.7)
Neutrophils Relative %: 65 %
Platelets: 183 10*3/uL (ref 150–400)
RBC: 5.02 MIL/uL (ref 4.22–5.81)
RDW: 14.8 % (ref 11.5–15.5)
WBC: 6 10*3/uL (ref 4.0–10.5)

## 2016-01-08 LAB — URINALYSIS, ROUTINE W REFLEX MICROSCOPIC
Bilirubin Urine: NEGATIVE
Glucose, UA: NEGATIVE mg/dL
Hgb urine dipstick: NEGATIVE
Ketones, ur: NEGATIVE mg/dL
Leukocytes, UA: NEGATIVE
Nitrite: NEGATIVE
Protein, ur: NEGATIVE mg/dL
Specific Gravity, Urine: <1.005 — ABNORMAL LOW (ref 1.005–1.030)
pH: 5.5 (ref 5.0–8.0)

## 2016-01-08 LAB — BASIC METABOLIC PANEL
Anion gap: 6 (ref 5–15)
BUN: 27 mg/dL — ABNORMAL HIGH (ref 6–20)
CO2: 35 mmol/L — ABNORMAL HIGH (ref 22–32)
Calcium: 9.6 mg/dL (ref 8.9–10.3)
Chloride: 98 mmol/L — ABNORMAL LOW (ref 101–111)
Creatinine, Ser: 1.59 mg/dL — ABNORMAL HIGH (ref 0.61–1.24)
GFR calc Af Amer: 51 mL/min — ABNORMAL LOW (ref >60)
GFR calc non Af Amer: 44 mL/min — ABNORMAL LOW (ref >60)
Glucose, Bld: 100 mg/dL — ABNORMAL HIGH (ref 65–99)
Potassium: 3.6 mmol/L (ref 3.5–5.1)
Sodium: 139 mmol/L (ref 135–145)

## 2016-01-08 NOTE — ED Notes (Signed)
Spoke with a staff member at [facility] and was told she would call her administrator to come pick pt up.

## 2016-01-08 NOTE — ED Notes (Signed)
Transportation from [facility] arrived to pick up patient.

## 2016-01-08 NOTE — ED Notes (Signed)
Spoke with [employer]. States person is on the way to pick up patient. Would not give me estimated time of arrival.

## 2016-01-08 NOTE — ED Notes (Signed)
Left message on home number and cell phone number for [name] to call er.

## 2016-01-08 NOTE — ED Triage Notes (Signed)
Patient brought in by EMS from [facility] with complaint of altered mental status. States patient has history of same "but is different this morning." Per EMS, patient has weakness and unsteady gait this morning.

## 2016-01-08 NOTE — ED Notes (Signed)
Caregiver states that patient's normal walk is short and shuffling.

## 2016-01-08 NOTE — ED Notes (Signed)
Patient calling out multiple times for attention. Call answered personally by this nurse 10 times in last 30 minutes. Patient repositioned and given urinal.

## 2016-01-08 NOTE — ED Notes (Signed)
Patient bedding changed and patient cleaned up 

## 2016-01-08 NOTE — ED Provider Notes (Signed)
[facility] DEPT Provider Note   CSN: [id number] Arrival date & time: 01/08/16  1884  By signing my name below, I, Higinio Plan, attest that this documentation has been prepared under the direction and in the presence of Virgel Manifold, MD . Electronically Signed: Higinio Plan, Scribe. 01/08/2016. 11:04 AM.  History   Chief Complaint Chief Complaint  Patient presents with  . Manic Behavior   The history is provided by the EMS personnel. No language interpreter was used.   LEVEL V CAVEAT and Limited ROS due to Dementia  HPI Comments: Marquiz Sotelo Kienast is a 65 y.o. male with PMHx of dementia,  brought in by EMS to the Emergency Department from [facility] for an evaluation of altered mental status that began this morning. Per EMS, pt has hx of the same but "his symptoms were different this morning." EMS reports pt also had weakness and unsteady gait this morning.    Past Medical History:  Diagnosis Date  . Cellulitis   . Dementia   . Edema   . Hypertension   . Hypoglycemia   . Mental retardation   . Parkinson's disease ([place])   . Psychosis   . Renal insufficiency     Patient Active Problem List   Diagnosis Date Noted  . Encounter for screening colonoscopy 01/30/2015    Past Surgical History:  Procedure Laterality Date  . COLONOSCOPY N/A 03/12/2015   Procedure: COLONOSCOPY;  Surgeon: Danie Binder, MD;  Location: [facility];  Service: Endoscopy;  Laterality: N/A;  1430 - moved to 1/16 @ 1:45 - office to notify    Home Medications    Prior to Admission medications   Medication Sig Start Date End Date Taking? Authorizing Provider  acetaminophen (Q-PAP) 500 MG tablet Take 500 mg by mouth every 6 (six) hours as needed. For pain    Historical Provider, MD  bisacodyl (BISACODYL) 5 MG EC tablet Take 1 tablet (5 mg total) by mouth daily as needed for moderate constipation. 02/06/15   Carlis Stable, NP  busPIRone (BUSPAR) 15 MG tablet Take 15 mg by mouth  2 (two) times daily.    Historical Provider, MD  carbidopa-levodopa (SINEMET IR) 25-100 MG per tablet Take 2 tablets by mouth 4 (four) times daily.    Historical Provider, MD  clonazePAM (KLONOPIN) 0.5 MG tablet Take 0.5 mg by mouth daily as needed. For agitation    Historical Provider, MD  cloZAPine (CLOZARIL) 100 MG tablet Take 100 mg by mouth at bedtime.    Historical Provider, MD  donepezil (ARICEPT) 10 MG tablet Take 10 mg by mouth daily.    Historical Provider, MD  magnesium oxide (MAG-OX) 400 MG tablet Take 400 mg by mouth 2 (two) times daily.    Historical Provider, MD  phosphate laxative (FLEET) 2.7-7.2 GM/15ML solution Take 15 mLs by mouth once. 02/06/15   Carlis Stable, NP  potassium chloride SA (K-DUR,KLOR-CON) 20 MEQ tablet Take 40 mEq by mouth 2 (two) times daily.    Historical Provider, MD  torsemide (DEMADEX) 20 MG tablet Take 40 mg by mouth 2 (two) times daily.    Historical Provider, MD    Family History Family History  Problem Relation Age of Onset  . Colon cancer      Unknown family history    Social History Social History  Substance Use Topics  . Smoking status: Current Every Day Smoker  . Smokeless tobacco: Never Used  . Alcohol use No  Allergies   Penicillins   Review of Systems Review of Systems  Unable to perform ROS: Dementia   Physical Exam Updated Vital Signs BP 137/70   Pulse 68   Resp 22   Ht 6' (1.829 m)   Wt 180 lb (81.6 kg)   SpO2 100%   BMI 24.41 kg/m   Physical Exam  Constitutional:  Cachectic, no acute distress.  HENT:  Head: Normocephalic and atraumatic.  Eyes: EOM are normal.  Neck: Normal range of motion.  Cardiovascular: Normal rate, regular rhythm, normal heart sounds and intact distal pulses.   Pulmonary/Chest: Effort normal and breath sounds normal. No respiratory distress.  Abdominal: Soft. He exhibits no distension. There is no tenderness.  Musculoskeletal: Normal range of motion.  Neurological: He is alert.  Slow  to follow commands but does so appropriately, no focal motor deficit.  Skin: Skin is warm and dry.  Psychiatric: He has a normal mood and affect. Judgment normal.  Nursing note and vitals reviewed.  ED Treatments / Results  Labs (all labs ordered are listed, but only abnormal results are displayed) Labs Reviewed  BASIC METABOLIC PANEL - Abnormal; Notable for the following:       Result Value   Chloride 98 (*)    CO2 35 (*)    Glucose, Bld 100 (*)    BUN 27 (*)    Creatinine, Ser 1.59 (*)    GFR calc non Af Amer 44 (*)    GFR calc Af Amer 51 (*)    All other components within normal limits  URINALYSIS, ROUTINE W REFLEX MICROSCOPIC (NOT AT [facility]) - Abnormal; Notable for the following:    Specific Gravity, Urine <1.005 (*)    All other components within normal limits  CBC WITH DIFFERENTIAL/PLATELET    EKG  EKG Interpretation  Date/Time:  Tuesday January 08 2016 09:44:45 EST Ventricular Rate:  71 PR Interval:    QRS Duration: 95 QT Interval:  374 QTC Calculation: 407 R Axis:   78 Text Interpretation:  Sinus rhythm Non-specific ST-t changes Baseline wander in lead(s) V4 Confirmed by Wilson Singer  MD, [place] ([id number]) on 01/08/2016 11:06:12 AM       Radiology No results found.  Procedures Procedures (including critical care time)  Medications Ordered in ED Medications - No data to display  DIAGNOSTIC STUDIES:  Oxygen Saturation is 100% on RA, normal by my interpretation.    COORDINATION OF CARE:  11:00 AM Discussed treatment plan with pt at bedside and pt agreed to plan.  Initial Impression / Assessment and Plan / ED Course  I have reviewed the triage vital signs and the nursing notes.  Pertinent labs & imaging results that were available during my care of the patient were reviewed by me and considered in my medical decision making (see chart for details).  Clinical Course     31 six-year-old male presenting for evaluation after caretakers noticed a change in his  mental status. Unclear etiology. Has baseline dementia. Is afebrile he dynamically stable. Work-up fairly unremarkable. It has been determined that no acute conditions requiring further emergency intervention are present at this time. The patient has been advised of the diagnosis and plan. I reviewed any labs and imaging including any potential incidental findings. We have discussed signs and symptoms that warrant return to the ED and they are listed in the discharge instructions.    I personally performed the services described in this documentation, which was scribed in my presence. The recorded information has been  reviewed and is accurate.   Final Clinical Impressions(s) / ED Diagnoses   Final diagnoses:  Altered mental status, unspecified altered mental status type    New Prescriptions New Prescriptions   No medications on file     Virgel Manifold, MD 01/21/16 1128

## 2016-01-08 NOTE — ED Notes (Signed)
Patient calling out that that he wants to go home.

## 2016-01-22 DIAGNOSIS — I10 Essential (primary) hypertension: Secondary | ICD-10-CM | POA: Diagnosis not present

## 2016-01-22 DIAGNOSIS — F0390 Unspecified dementia without behavioral disturbance: Secondary | ICD-10-CM | POA: Diagnosis not present

## 2016-01-22 DIAGNOSIS — R609 Edema, unspecified: Secondary | ICD-10-CM | POA: Diagnosis not present

## 2016-01-22 DIAGNOSIS — G20 Parkinson's disease: Secondary | ICD-10-CM | POA: Diagnosis not present

## 2016-01-22 DIAGNOSIS — R32 Unspecified urinary incontinence: Secondary | ICD-10-CM | POA: Diagnosis not present

## 2016-01-22 DIAGNOSIS — F79 Unspecified intellectual disabilities: Secondary | ICD-10-CM | POA: Diagnosis not present

## 2016-01-22 DIAGNOSIS — R739 Hyperglycemia, unspecified: Secondary | ICD-10-CM | POA: Diagnosis not present

## 2016-01-22 DIAGNOSIS — F29 Unspecified psychosis not due to a substance or known physiological condition: Secondary | ICD-10-CM | POA: Diagnosis not present

## 2016-01-28 DIAGNOSIS — G20 Parkinson's disease: Secondary | ICD-10-CM | POA: Diagnosis not present

## 2016-01-28 DIAGNOSIS — Z79899 Other long term (current) drug therapy: Secondary | ICD-10-CM | POA: Diagnosis not present

## 2016-01-28 DIAGNOSIS — R600 Localized edema: Secondary | ICD-10-CM | POA: Diagnosis not present

## 2016-01-28 DIAGNOSIS — I10 Essential (primary) hypertension: Secondary | ICD-10-CM | POA: Diagnosis not present

## 2016-01-29 DIAGNOSIS — Z23 Encounter for immunization: Secondary | ICD-10-CM | POA: Diagnosis not present

## 2016-01-29 DIAGNOSIS — Z79899 Other long term (current) drug therapy: Secondary | ICD-10-CM | POA: Diagnosis not present

## 2016-02-08 DIAGNOSIS — B351 Tinea unguium: Secondary | ICD-10-CM | POA: Diagnosis not present

## 2016-02-08 DIAGNOSIS — I739 Peripheral vascular disease, unspecified: Secondary | ICD-10-CM | POA: Diagnosis not present

## 2016-02-11 DIAGNOSIS — F209 Schizophrenia, unspecified: Secondary | ICD-10-CM | POA: Diagnosis not present

## 2016-03-04 DIAGNOSIS — F209 Schizophrenia, unspecified: Secondary | ICD-10-CM | POA: Diagnosis not present

## 2016-03-04 DIAGNOSIS — Z79899 Other long term (current) drug therapy: Secondary | ICD-10-CM | POA: Diagnosis not present

## 2016-03-21 DIAGNOSIS — I10 Essential (primary) hypertension: Secondary | ICD-10-CM | POA: Diagnosis not present

## 2016-03-24 DIAGNOSIS — R609 Edema, unspecified: Secondary | ICD-10-CM | POA: Diagnosis not present

## 2016-03-24 DIAGNOSIS — I10 Essential (primary) hypertension: Secondary | ICD-10-CM | POA: Diagnosis not present

## 2016-03-24 DIAGNOSIS — N182 Chronic kidney disease, stage 2 (mild): Secondary | ICD-10-CM | POA: Diagnosis not present

## 2016-03-25 ENCOUNTER — Other Ambulatory Visit (HOSPITAL_COMMUNITY): Payer: Self-pay | Admitting: Internal Medicine

## 2016-03-25 DIAGNOSIS — N182 Chronic kidney disease, stage 2 (mild): Secondary | ICD-10-CM

## 2016-03-31 DIAGNOSIS — Z79899 Other long term (current) drug therapy: Secondary | ICD-10-CM | POA: Diagnosis not present

## 2016-04-01 ENCOUNTER — Ambulatory Visit (HOSPITAL_COMMUNITY)
Admission: RE | Admit: 2016-04-01 | Discharge: 2016-04-01 | Disposition: A | Discharge status: Home / Self Care | Payer: Medicare Other | Source: Ambulatory Visit | Attending: Internal Medicine | Admitting: Internal Medicine

## 2016-04-01 DIAGNOSIS — I129 Hypertensive chronic kidney disease with stage 1 through stage 4 chronic kidney disease, or unspecified chronic kidney disease: Secondary | ICD-10-CM | POA: Diagnosis not present

## 2016-04-01 DIAGNOSIS — N182 Chronic kidney disease, stage 2 (mild): Secondary | ICD-10-CM | POA: Insufficient documentation

## 2016-04-18 DIAGNOSIS — B351 Tinea unguium: Secondary | ICD-10-CM | POA: Diagnosis not present

## 2016-04-18 DIAGNOSIS — I739 Peripheral vascular disease, unspecified: Secondary | ICD-10-CM | POA: Diagnosis not present

## 2016-04-23 DIAGNOSIS — G2401 Drug induced subacute dyskinesia: Secondary | ICD-10-CM | POA: Diagnosis not present

## 2016-04-23 DIAGNOSIS — R600 Localized edema: Secondary | ICD-10-CM | POA: Diagnosis not present

## 2016-04-23 DIAGNOSIS — I10 Essential (primary) hypertension: Secondary | ICD-10-CM | POA: Diagnosis not present

## 2016-04-23 DIAGNOSIS — Z79899 Other long term (current) drug therapy: Secondary | ICD-10-CM | POA: Diagnosis not present

## 2016-04-23 DIAGNOSIS — G20 Parkinson's disease: Secondary | ICD-10-CM | POA: Diagnosis not present

## 2016-04-28 DIAGNOSIS — Z79899 Other long term (current) drug therapy: Secondary | ICD-10-CM | POA: Diagnosis not present

## 2016-05-21 DIAGNOSIS — R739 Hyperglycemia, unspecified: Secondary | ICD-10-CM | POA: Diagnosis not present

## 2016-05-21 DIAGNOSIS — I10 Essential (primary) hypertension: Secondary | ICD-10-CM | POA: Diagnosis not present

## 2016-05-21 DIAGNOSIS — R609 Edema, unspecified: Secondary | ICD-10-CM | POA: Diagnosis not present

## 2016-05-21 DIAGNOSIS — G20 Parkinson's disease: Secondary | ICD-10-CM | POA: Diagnosis not present

## 2016-05-21 DIAGNOSIS — F0390 Unspecified dementia without behavioral disturbance: Secondary | ICD-10-CM | POA: Diagnosis not present

## 2016-05-21 DIAGNOSIS — N182 Chronic kidney disease, stage 2 (mild): Secondary | ICD-10-CM | POA: Diagnosis not present

## 2016-05-21 DIAGNOSIS — R32 Unspecified urinary incontinence: Secondary | ICD-10-CM | POA: Diagnosis not present

## 2016-05-21 DIAGNOSIS — F79 Unspecified intellectual disabilities: Secondary | ICD-10-CM | POA: Diagnosis not present

## 2016-05-21 DIAGNOSIS — F29 Unspecified psychosis not due to a substance or known physiological condition: Secondary | ICD-10-CM | POA: Diagnosis not present

## 2016-05-28 DIAGNOSIS — Z79899 Other long term (current) drug therapy: Secondary | ICD-10-CM | POA: Diagnosis not present

## 2016-05-30 DIAGNOSIS — I129 Hypertensive chronic kidney disease with stage 1 through stage 4 chronic kidney disease, or unspecified chronic kidney disease: Secondary | ICD-10-CM | POA: Diagnosis not present

## 2016-06-04 DIAGNOSIS — F209 Schizophrenia, unspecified: Secondary | ICD-10-CM | POA: Diagnosis not present

## 2016-06-09 DIAGNOSIS — I10 Essential (primary) hypertension: Secondary | ICD-10-CM | POA: Diagnosis not present

## 2016-06-09 DIAGNOSIS — G2401 Drug induced subacute dyskinesia: Secondary | ICD-10-CM | POA: Diagnosis not present

## 2016-06-09 DIAGNOSIS — Z79899 Other long term (current) drug therapy: Secondary | ICD-10-CM | POA: Diagnosis not present

## 2016-06-09 DIAGNOSIS — G20 Parkinson's disease: Secondary | ICD-10-CM | POA: Diagnosis not present

## 2016-06-09 DIAGNOSIS — R600 Localized edema: Secondary | ICD-10-CM | POA: Diagnosis not present

## 2016-06-27 DIAGNOSIS — I739 Peripheral vascular disease, unspecified: Secondary | ICD-10-CM | POA: Diagnosis not present

## 2016-06-27 DIAGNOSIS — B351 Tinea unguium: Secondary | ICD-10-CM | POA: Diagnosis not present

## 2016-06-30 DIAGNOSIS — Z79899 Other long term (current) drug therapy: Secondary | ICD-10-CM | POA: Diagnosis not present

## 2016-07-28 DIAGNOSIS — Z79899 Other long term (current) drug therapy: Secondary | ICD-10-CM | POA: Diagnosis not present

## 2016-07-28 DIAGNOSIS — I129 Hypertensive chronic kidney disease with stage 1 through stage 4 chronic kidney disease, or unspecified chronic kidney disease: Secondary | ICD-10-CM | POA: Diagnosis not present

## 2016-08-04 DIAGNOSIS — F209 Schizophrenia, unspecified: Secondary | ICD-10-CM | POA: Diagnosis not present

## 2016-08-13 DIAGNOSIS — N182 Chronic kidney disease, stage 2 (mild): Secondary | ICD-10-CM | POA: Diagnosis not present

## 2016-08-13 DIAGNOSIS — R609 Edema, unspecified: Secondary | ICD-10-CM | POA: Diagnosis not present

## 2016-08-13 DIAGNOSIS — R739 Hyperglycemia, unspecified: Secondary | ICD-10-CM | POA: Diagnosis not present

## 2016-08-13 DIAGNOSIS — R32 Unspecified urinary incontinence: Secondary | ICD-10-CM | POA: Diagnosis not present

## 2016-08-13 DIAGNOSIS — F29 Unspecified psychosis not due to a substance or known physiological condition: Secondary | ICD-10-CM | POA: Diagnosis not present

## 2016-08-13 DIAGNOSIS — F0390 Unspecified dementia without behavioral disturbance: Secondary | ICD-10-CM | POA: Diagnosis not present

## 2016-08-13 DIAGNOSIS — I10 Essential (primary) hypertension: Secondary | ICD-10-CM | POA: Diagnosis not present

## 2016-08-13 DIAGNOSIS — G20 Parkinson's disease: Secondary | ICD-10-CM | POA: Diagnosis not present

## 2016-08-13 DIAGNOSIS — F79 Unspecified intellectual disabilities: Secondary | ICD-10-CM | POA: Diagnosis not present

## 2016-08-25 DIAGNOSIS — Z79899 Other long term (current) drug therapy: Secondary | ICD-10-CM | POA: Diagnosis not present

## 2016-09-05 DIAGNOSIS — I739 Peripheral vascular disease, unspecified: Secondary | ICD-10-CM | POA: Diagnosis not present

## 2016-09-05 DIAGNOSIS — B351 Tinea unguium: Secondary | ICD-10-CM | POA: Diagnosis not present

## 2016-09-08 DIAGNOSIS — F209 Schizophrenia, unspecified: Secondary | ICD-10-CM | POA: Diagnosis not present

## 2016-09-29 DIAGNOSIS — Z79899 Other long term (current) drug therapy: Secondary | ICD-10-CM | POA: Diagnosis not present

## 2016-10-22 DIAGNOSIS — F209 Schizophrenia, unspecified: Secondary | ICD-10-CM | POA: Diagnosis not present

## 2016-10-23 DIAGNOSIS — I129 Hypertensive chronic kidney disease with stage 1 through stage 4 chronic kidney disease, or unspecified chronic kidney disease: Secondary | ICD-10-CM | POA: Diagnosis not present

## 2016-10-28 DIAGNOSIS — Z79899 Other long term (current) drug therapy: Secondary | ICD-10-CM | POA: Diagnosis not present

## 2016-11-14 DIAGNOSIS — B351 Tinea unguium: Secondary | ICD-10-CM | POA: Diagnosis not present

## 2016-11-14 DIAGNOSIS — F0390 Unspecified dementia without behavioral disturbance: Secondary | ICD-10-CM | POA: Diagnosis not present

## 2016-11-14 DIAGNOSIS — G20 Parkinson's disease: Secondary | ICD-10-CM | POA: Diagnosis not present

## 2016-11-14 DIAGNOSIS — Z23 Encounter for immunization: Secondary | ICD-10-CM | POA: Diagnosis not present

## 2016-11-14 DIAGNOSIS — I10 Essential (primary) hypertension: Secondary | ICD-10-CM | POA: Diagnosis not present

## 2016-11-14 DIAGNOSIS — N182 Chronic kidney disease, stage 2 (mild): Secondary | ICD-10-CM | POA: Diagnosis not present

## 2016-11-14 DIAGNOSIS — I739 Peripheral vascular disease, unspecified: Secondary | ICD-10-CM | POA: Diagnosis not present

## 2016-11-24 DIAGNOSIS — Z79899 Other long term (current) drug therapy: Secondary | ICD-10-CM | POA: Diagnosis not present

## 2016-12-01 DIAGNOSIS — Z79899 Other long term (current) drug therapy: Secondary | ICD-10-CM | POA: Diagnosis not present

## 2016-12-01 DIAGNOSIS — I10 Essential (primary) hypertension: Secondary | ICD-10-CM | POA: Diagnosis not present

## 2016-12-01 DIAGNOSIS — R269 Unspecified abnormalities of gait and mobility: Secondary | ICD-10-CM | POA: Diagnosis not present

## 2016-12-01 DIAGNOSIS — G20 Parkinson's disease: Secondary | ICD-10-CM | POA: Diagnosis not present

## 2016-12-30 DIAGNOSIS — Z79899 Other long term (current) drug therapy: Secondary | ICD-10-CM | POA: Diagnosis not present

## 2017-01-23 DIAGNOSIS — B351 Tinea unguium: Secondary | ICD-10-CM | POA: Diagnosis not present

## 2017-01-23 DIAGNOSIS — I739 Peripheral vascular disease, unspecified: Secondary | ICD-10-CM | POA: Diagnosis not present

## 2017-01-26 DIAGNOSIS — Z79899 Other long term (current) drug therapy: Secondary | ICD-10-CM | POA: Diagnosis not present

## 2017-01-27 DIAGNOSIS — F209 Schizophrenia, unspecified: Secondary | ICD-10-CM | POA: Diagnosis not present

## 2017-02-13 DIAGNOSIS — R739 Hyperglycemia, unspecified: Secondary | ICD-10-CM | POA: Diagnosis not present

## 2017-02-13 DIAGNOSIS — F79 Unspecified intellectual disabilities: Secondary | ICD-10-CM | POA: Diagnosis not present

## 2017-02-13 DIAGNOSIS — F0390 Unspecified dementia without behavioral disturbance: Secondary | ICD-10-CM | POA: Diagnosis not present

## 2017-02-13 DIAGNOSIS — Z1389 Encounter for screening for other disorder: Secondary | ICD-10-CM | POA: Diagnosis not present

## 2017-02-13 DIAGNOSIS — R32 Unspecified urinary incontinence: Secondary | ICD-10-CM | POA: Diagnosis not present

## 2017-02-13 DIAGNOSIS — F29 Unspecified psychosis not due to a substance or known physiological condition: Secondary | ICD-10-CM | POA: Diagnosis not present

## 2017-02-13 DIAGNOSIS — G20 Parkinson's disease: Secondary | ICD-10-CM | POA: Diagnosis not present

## 2017-02-13 DIAGNOSIS — I10 Essential (primary) hypertension: Secondary | ICD-10-CM | POA: Diagnosis not present

## 2017-02-13 DIAGNOSIS — R609 Edema, unspecified: Secondary | ICD-10-CM | POA: Diagnosis not present

## 2017-03-02 DIAGNOSIS — Z79899 Other long term (current) drug therapy: Secondary | ICD-10-CM | POA: Diagnosis not present

## 2017-03-30 DIAGNOSIS — Z79899 Other long term (current) drug therapy: Secondary | ICD-10-CM | POA: Diagnosis not present

## 2017-04-03 DIAGNOSIS — I739 Peripheral vascular disease, unspecified: Secondary | ICD-10-CM | POA: Diagnosis not present

## 2017-04-03 DIAGNOSIS — B351 Tinea unguium: Secondary | ICD-10-CM | POA: Diagnosis not present

## 2017-04-27 DIAGNOSIS — Z79899 Other long term (current) drug therapy: Secondary | ICD-10-CM | POA: Diagnosis not present

## 2017-05-19 DIAGNOSIS — F209 Schizophrenia, unspecified: Secondary | ICD-10-CM | POA: Diagnosis not present

## 2017-05-22 DIAGNOSIS — R2689 Other abnormalities of gait and mobility: Secondary | ICD-10-CM | POA: Diagnosis not present

## 2017-05-22 DIAGNOSIS — I10 Essential (primary) hypertension: Secondary | ICD-10-CM | POA: Diagnosis not present

## 2017-05-22 DIAGNOSIS — G20 Parkinson's disease: Secondary | ICD-10-CM | POA: Diagnosis not present

## 2017-05-22 DIAGNOSIS — R609 Edema, unspecified: Secondary | ICD-10-CM | POA: Diagnosis not present

## 2017-05-25 DIAGNOSIS — Z79899 Other long term (current) drug therapy: Secondary | ICD-10-CM | POA: Diagnosis not present

## 2017-05-27 DIAGNOSIS — F0281 Dementia in other diseases classified elsewhere with behavioral disturbance: Secondary | ICD-10-CM | POA: Diagnosis not present

## 2017-05-27 DIAGNOSIS — Z79899 Other long term (current) drug therapy: Secondary | ICD-10-CM | POA: Diagnosis not present

## 2017-05-27 DIAGNOSIS — G2111 Neuroleptic induced parkinsonism: Secondary | ICD-10-CM | POA: Diagnosis not present

## 2017-05-27 DIAGNOSIS — G20 Parkinson's disease: Secondary | ICD-10-CM | POA: Diagnosis not present

## 2017-06-12 DIAGNOSIS — B351 Tinea unguium: Secondary | ICD-10-CM | POA: Diagnosis not present

## 2017-06-12 DIAGNOSIS — I739 Peripheral vascular disease, unspecified: Secondary | ICD-10-CM | POA: Diagnosis not present

## 2017-06-30 DIAGNOSIS — Z79899 Other long term (current) drug therapy: Secondary | ICD-10-CM | POA: Diagnosis not present

## 2017-07-28 DIAGNOSIS — Z79899 Other long term (current) drug therapy: Secondary | ICD-10-CM | POA: Diagnosis not present

## 2017-08-04 DIAGNOSIS — F209 Schizophrenia, unspecified: Secondary | ICD-10-CM | POA: Diagnosis not present

## 2017-08-26 DIAGNOSIS — Z79899 Other long term (current) drug therapy: Secondary | ICD-10-CM | POA: Diagnosis not present

## 2017-09-11 ENCOUNTER — Other Ambulatory Visit (HOSPITAL_COMMUNITY): Payer: Self-pay | Admitting: Internal Medicine

## 2017-09-11 ENCOUNTER — Ambulatory Visit (HOSPITAL_COMMUNITY)
Admission: RE | Admit: 2017-09-11 | Discharge: 2017-09-11 | Disposition: A | Discharge status: Home / Self Care | Payer: Medicare Other | Source: Ambulatory Visit | Attending: Internal Medicine | Admitting: Internal Medicine

## 2017-09-11 DIAGNOSIS — Z720 Tobacco use: Secondary | ICD-10-CM | POA: Diagnosis not present

## 2017-09-11 DIAGNOSIS — R634 Abnormal weight loss: Secondary | ICD-10-CM | POA: Diagnosis not present

## 2017-09-11 DIAGNOSIS — F0390 Unspecified dementia without behavioral disturbance: Secondary | ICD-10-CM | POA: Diagnosis not present

## 2017-09-11 DIAGNOSIS — G20 Parkinson's disease: Secondary | ICD-10-CM | POA: Diagnosis not present

## 2017-09-11 DIAGNOSIS — F79 Unspecified intellectual disabilities: Secondary | ICD-10-CM | POA: Diagnosis not present

## 2017-09-11 DIAGNOSIS — R635 Abnormal weight gain: Secondary | ICD-10-CM | POA: Diagnosis not present

## 2017-09-11 DIAGNOSIS — I10 Essential (primary) hypertension: Secondary | ICD-10-CM | POA: Diagnosis not present

## 2017-09-11 DIAGNOSIS — F29 Unspecified psychosis not due to a substance or known physiological condition: Secondary | ICD-10-CM | POA: Diagnosis not present

## 2017-09-14 ENCOUNTER — Other Ambulatory Visit (HOSPITAL_COMMUNITY): Payer: Self-pay | Admitting: Internal Medicine

## 2017-09-23 DIAGNOSIS — G20 Parkinson's disease: Secondary | ICD-10-CM | POA: Diagnosis not present

## 2017-09-23 DIAGNOSIS — F0281 Dementia in other diseases classified elsewhere with behavioral disturbance: Secondary | ICD-10-CM | POA: Diagnosis not present

## 2017-09-23 DIAGNOSIS — Z79899 Other long term (current) drug therapy: Secondary | ICD-10-CM | POA: Diagnosis not present

## 2017-09-23 DIAGNOSIS — G2111 Neuroleptic induced parkinsonism: Secondary | ICD-10-CM | POA: Diagnosis not present

## 2017-09-28 DIAGNOSIS — Z79899 Other long term (current) drug therapy: Secondary | ICD-10-CM | POA: Diagnosis not present

## 2017-10-23 DIAGNOSIS — F209 Schizophrenia, unspecified: Secondary | ICD-10-CM | POA: Diagnosis not present

## 2017-10-27 DIAGNOSIS — Z79899 Other long term (current) drug therapy: Secondary | ICD-10-CM | POA: Diagnosis not present

## 2017-10-29 DIAGNOSIS — R634 Abnormal weight loss: Secondary | ICD-10-CM | POA: Diagnosis not present

## 2017-10-29 DIAGNOSIS — F79 Unspecified intellectual disabilities: Secondary | ICD-10-CM | POA: Diagnosis not present

## 2017-10-29 DIAGNOSIS — E43 Unspecified severe protein-calorie malnutrition: Secondary | ICD-10-CM | POA: Diagnosis not present

## 2017-10-29 DIAGNOSIS — F0390 Unspecified dementia without behavioral disturbance: Secondary | ICD-10-CM | POA: Diagnosis not present

## 2017-11-25 DIAGNOSIS — Z79899 Other long term (current) drug therapy: Secondary | ICD-10-CM | POA: Diagnosis not present

## 2017-12-10 DIAGNOSIS — Z23 Encounter for immunization: Secondary | ICD-10-CM | POA: Diagnosis not present

## 2017-12-10 DIAGNOSIS — G20 Parkinson's disease: Secondary | ICD-10-CM | POA: Diagnosis not present

## 2017-12-10 DIAGNOSIS — R634 Abnormal weight loss: Secondary | ICD-10-CM | POA: Diagnosis not present

## 2017-12-10 DIAGNOSIS — F0390 Unspecified dementia without behavioral disturbance: Secondary | ICD-10-CM | POA: Diagnosis not present

## 2017-12-10 DIAGNOSIS — F79 Unspecified intellectual disabilities: Secondary | ICD-10-CM | POA: Diagnosis not present

## 2017-12-30 DIAGNOSIS — Z79899 Other long term (current) drug therapy: Secondary | ICD-10-CM | POA: Diagnosis not present

## 2018-01-18 DIAGNOSIS — F209 Schizophrenia, unspecified: Secondary | ICD-10-CM | POA: Diagnosis not present

## 2018-01-27 DIAGNOSIS — Z79899 Other long term (current) drug therapy: Secondary | ICD-10-CM | POA: Diagnosis not present

## 2018-02-15 DIAGNOSIS — Z1389 Encounter for screening for other disorder: Secondary | ICD-10-CM | POA: Diagnosis not present

## 2018-02-15 DIAGNOSIS — R609 Edema, unspecified: Secondary | ICD-10-CM | POA: Diagnosis not present

## 2018-02-15 DIAGNOSIS — F17210 Nicotine dependence, cigarettes, uncomplicated: Secondary | ICD-10-CM | POA: Diagnosis not present

## 2018-02-15 DIAGNOSIS — F79 Unspecified intellectual disabilities: Secondary | ICD-10-CM | POA: Diagnosis not present

## 2018-02-15 DIAGNOSIS — F29 Unspecified psychosis not due to a substance or known physiological condition: Secondary | ICD-10-CM | POA: Diagnosis not present

## 2018-02-15 DIAGNOSIS — F0390 Unspecified dementia without behavioral disturbance: Secondary | ICD-10-CM | POA: Diagnosis not present

## 2018-02-15 DIAGNOSIS — R739 Hyperglycemia, unspecified: Secondary | ICD-10-CM | POA: Diagnosis not present

## 2018-02-15 DIAGNOSIS — R32 Unspecified urinary incontinence: Secondary | ICD-10-CM | POA: Diagnosis not present

## 2018-02-15 DIAGNOSIS — I10 Essential (primary) hypertension: Secondary | ICD-10-CM | POA: Diagnosis not present

## 2018-02-15 DIAGNOSIS — Z1331 Encounter for screening for depression: Secondary | ICD-10-CM | POA: Diagnosis not present

## 2018-02-15 DIAGNOSIS — G20 Parkinson's disease: Secondary | ICD-10-CM | POA: Diagnosis not present

## 2018-03-01 DIAGNOSIS — Z79899 Other long term (current) drug therapy: Secondary | ICD-10-CM | POA: Diagnosis not present

## 2018-03-06 DIAGNOSIS — R29717 NIHSS score 17: Secondary | ICD-10-CM | POA: Diagnosis present

## 2018-03-06 DIAGNOSIS — R471 Dysarthria and anarthria: Secondary | ICD-10-CM | POA: Diagnosis present

## 2018-03-06 DIAGNOSIS — G459 Transient cerebral ischemic attack, unspecified: Secondary | ICD-10-CM | POA: Diagnosis present

## 2018-03-06 DIAGNOSIS — F329 Major depressive disorder, single episode, unspecified: Secondary | ICD-10-CM | POA: Diagnosis present

## 2018-03-06 DIAGNOSIS — D696 Thrombocytopenia, unspecified: Secondary | ICD-10-CM | POA: Diagnosis present

## 2018-03-06 DIAGNOSIS — F17210 Nicotine dependence, cigarettes, uncomplicated: Secondary | ICD-10-CM | POA: Diagnosis present

## 2018-03-06 DIAGNOSIS — I959 Hypotension, unspecified: Secondary | ICD-10-CM | POA: Diagnosis not present

## 2018-03-06 DIAGNOSIS — F819 Developmental disorder of scholastic skills, unspecified: Secondary | ICD-10-CM | POA: Diagnosis not present

## 2018-03-06 DIAGNOSIS — Z823 Family history of stroke: Secondary | ICD-10-CM | POA: Diagnosis not present

## 2018-03-06 DIAGNOSIS — F0390 Unspecified dementia without behavioral disturbance: Secondary | ICD-10-CM | POA: Diagnosis not present

## 2018-03-06 DIAGNOSIS — A419 Sepsis, unspecified organism: Secondary | ICD-10-CM | POA: Diagnosis not present

## 2018-03-06 DIAGNOSIS — G20 Parkinson's disease: Secondary | ICD-10-CM | POA: Diagnosis present

## 2018-03-06 DIAGNOSIS — A4151 Sepsis due to Escherichia coli [E. coli]: Secondary | ICD-10-CM | POA: Diagnosis present

## 2018-03-06 DIAGNOSIS — Z23 Encounter for immunization: Secondary | ICD-10-CM | POA: Diagnosis not present

## 2018-03-06 DIAGNOSIS — E441 Mild protein-calorie malnutrition: Secondary | ICD-10-CM | POA: Diagnosis present

## 2018-03-06 DIAGNOSIS — N181 Chronic kidney disease, stage 1: Secondary | ICD-10-CM | POA: Diagnosis present

## 2018-03-06 DIAGNOSIS — R7881 Bacteremia: Secondary | ICD-10-CM | POA: Diagnosis not present

## 2018-03-06 DIAGNOSIS — S3730XA Unspecified injury of urethra, initial encounter: Secondary | ICD-10-CM | POA: Diagnosis not present

## 2018-03-06 DIAGNOSIS — I129 Hypertensive chronic kidney disease with stage 1 through stage 4 chronic kidney disease, or unspecified chronic kidney disease: Secondary | ICD-10-CM | POA: Diagnosis present

## 2018-03-06 DIAGNOSIS — R6521 Severe sepsis with septic shock: Secondary | ICD-10-CM | POA: Diagnosis present

## 2018-03-06 DIAGNOSIS — I639 Cerebral infarction, unspecified: Secondary | ICD-10-CM | POA: Diagnosis present

## 2018-03-06 DIAGNOSIS — N189 Chronic kidney disease, unspecified: Secondary | ICD-10-CM | POA: Diagnosis not present

## 2018-03-06 DIAGNOSIS — F29 Unspecified psychosis not due to a substance or known physiological condition: Secondary | ICD-10-CM | POA: Diagnosis present

## 2018-03-06 DIAGNOSIS — I340 Nonrheumatic mitral (valve) insufficiency: Secondary | ICD-10-CM | POA: Diagnosis not present

## 2018-03-06 DIAGNOSIS — F419 Anxiety disorder, unspecified: Secondary | ICD-10-CM | POA: Diagnosis present

## 2018-03-06 DIAGNOSIS — F0280 Dementia in other diseases classified elsewhere without behavioral disturbance: Secondary | ICD-10-CM | POA: Diagnosis present

## 2018-03-06 DIAGNOSIS — I6389 Other cerebral infarction: Secondary | ICD-10-CM | POA: Diagnosis not present

## 2018-03-06 DIAGNOSIS — R600 Localized edema: Secondary | ICD-10-CM | POA: Diagnosis present

## 2018-03-06 DIAGNOSIS — I517 Cardiomegaly: Secondary | ICD-10-CM | POA: Diagnosis not present

## 2018-03-06 DIAGNOSIS — R2681 Unsteadiness on feet: Secondary | ICD-10-CM | POA: Diagnosis present

## 2018-03-06 DIAGNOSIS — R29810 Facial weakness: Secondary | ICD-10-CM | POA: Diagnosis present

## 2018-03-06 DIAGNOSIS — F79 Unspecified intellectual disabilities: Secondary | ICD-10-CM | POA: Diagnosis present

## 2018-03-06 DIAGNOSIS — N400 Enlarged prostate without lower urinary tract symptoms: Secondary | ICD-10-CM | POA: Diagnosis present

## 2018-03-06 DIAGNOSIS — G8324 Monoplegia of upper limb affecting left nondominant side: Secondary | ICD-10-CM | POA: Diagnosis present

## 2018-03-06 DIAGNOSIS — D631 Anemia in chronic kidney disease: Secondary | ICD-10-CM | POA: Diagnosis present

## 2018-03-22 DIAGNOSIS — F0390 Unspecified dementia without behavioral disturbance: Secondary | ICD-10-CM | POA: Diagnosis not present

## 2018-03-22 DIAGNOSIS — I10 Essential (primary) hypertension: Secondary | ICD-10-CM | POA: Diagnosis not present

## 2018-03-22 DIAGNOSIS — F29 Unspecified psychosis not due to a substance or known physiological condition: Secondary | ICD-10-CM | POA: Diagnosis not present

## 2018-03-22 DIAGNOSIS — A4150 Gram-negative sepsis, unspecified: Secondary | ICD-10-CM | POA: Diagnosis not present

## 2018-03-29 DIAGNOSIS — Z79899 Other long term (current) drug therapy: Secondary | ICD-10-CM | POA: Diagnosis not present

## 2018-04-14 DIAGNOSIS — H18413 Arcus senilis, bilateral: Secondary | ICD-10-CM | POA: Diagnosis not present

## 2018-04-14 DIAGNOSIS — H2513 Age-related nuclear cataract, bilateral: Secondary | ICD-10-CM | POA: Diagnosis not present

## 2018-04-14 DIAGNOSIS — H43813 Vitreous degeneration, bilateral: Secondary | ICD-10-CM | POA: Diagnosis not present

## 2018-04-14 DIAGNOSIS — H35033 Hypertensive retinopathy, bilateral: Secondary | ICD-10-CM | POA: Diagnosis not present

## 2018-04-26 DIAGNOSIS — Z79899 Other long term (current) drug therapy: Secondary | ICD-10-CM | POA: Diagnosis not present

## 2018-05-03 DIAGNOSIS — M79672 Pain in left foot: Secondary | ICD-10-CM | POA: Diagnosis not present

## 2018-05-03 DIAGNOSIS — B353 Tinea pedis: Secondary | ICD-10-CM | POA: Diagnosis not present

## 2018-05-03 DIAGNOSIS — M2041 Other hammer toe(s) (acquired), right foot: Secondary | ICD-10-CM | POA: Diagnosis not present

## 2018-05-03 DIAGNOSIS — B351 Tinea unguium: Secondary | ICD-10-CM | POA: Diagnosis not present

## 2018-05-03 DIAGNOSIS — L84 Corns and callosities: Secondary | ICD-10-CM | POA: Diagnosis not present

## 2018-05-03 DIAGNOSIS — I739 Peripheral vascular disease, unspecified: Secondary | ICD-10-CM | POA: Diagnosis not present

## 2018-05-03 DIAGNOSIS — M2011 Hallux valgus (acquired), right foot: Secondary | ICD-10-CM | POA: Diagnosis not present

## 2018-05-19 DIAGNOSIS — F0390 Unspecified dementia without behavioral disturbance: Secondary | ICD-10-CM | POA: Diagnosis not present

## 2018-05-19 DIAGNOSIS — G20 Parkinson's disease: Secondary | ICD-10-CM | POA: Diagnosis not present

## 2018-05-19 DIAGNOSIS — N184 Chronic kidney disease, stage 4 (severe): Secondary | ICD-10-CM | POA: Diagnosis not present

## 2018-05-19 DIAGNOSIS — I10 Essential (primary) hypertension: Secondary | ICD-10-CM | POA: Diagnosis not present

## 2018-05-26 DIAGNOSIS — Z79899 Other long term (current) drug therapy: Secondary | ICD-10-CM | POA: Diagnosis not present

## 2018-06-01 DIAGNOSIS — G2111 Neuroleptic induced parkinsonism: Secondary | ICD-10-CM | POA: Diagnosis not present

## 2018-06-01 DIAGNOSIS — G20 Parkinson's disease: Secondary | ICD-10-CM | POA: Diagnosis not present

## 2018-06-01 DIAGNOSIS — Z79899 Other long term (current) drug therapy: Secondary | ICD-10-CM | POA: Diagnosis not present

## 2018-06-01 DIAGNOSIS — F0281 Dementia in other diseases classified elsewhere with behavioral disturbance: Secondary | ICD-10-CM | POA: Diagnosis not present

## 2018-06-28 DIAGNOSIS — Z79899 Other long term (current) drug therapy: Secondary | ICD-10-CM | POA: Diagnosis not present

## 2018-07-27 DIAGNOSIS — Z79899 Other long term (current) drug therapy: Secondary | ICD-10-CM | POA: Diagnosis not present

## 2018-08-18 DIAGNOSIS — F79 Unspecified intellectual disabilities: Secondary | ICD-10-CM | POA: Diagnosis not present

## 2018-08-18 DIAGNOSIS — F29 Unspecified psychosis not due to a substance or known physiological condition: Secondary | ICD-10-CM | POA: Diagnosis not present

## 2018-08-18 DIAGNOSIS — I10 Essential (primary) hypertension: Secondary | ICD-10-CM | POA: Diagnosis not present

## 2018-08-18 DIAGNOSIS — G20 Parkinson's disease: Secondary | ICD-10-CM | POA: Diagnosis not present

## 2018-08-25 DIAGNOSIS — G2111 Neuroleptic induced parkinsonism: Secondary | ICD-10-CM | POA: Diagnosis not present

## 2018-08-25 DIAGNOSIS — Z79899 Other long term (current) drug therapy: Secondary | ICD-10-CM | POA: Diagnosis not present

## 2018-08-25 DIAGNOSIS — F0281 Dementia in other diseases classified elsewhere with behavioral disturbance: Secondary | ICD-10-CM | POA: Diagnosis not present

## 2018-08-25 DIAGNOSIS — G20 Parkinson's disease: Secondary | ICD-10-CM | POA: Diagnosis not present

## 2018-08-31 DIAGNOSIS — Z79899 Other long term (current) drug therapy: Secondary | ICD-10-CM | POA: Diagnosis not present

## 2018-09-27 DIAGNOSIS — Z79899 Other long term (current) drug therapy: Secondary | ICD-10-CM | POA: Diagnosis not present

## 2018-10-08 DIAGNOSIS — F209 Schizophrenia, unspecified: Secondary | ICD-10-CM | POA: Diagnosis not present

## 2018-10-13 ENCOUNTER — Inpatient Hospital Stay (HOSPITAL_COMMUNITY)
Admission: EM | Admit: 2018-10-13 | Discharge: 2018-10-16 | DRG: 603 | Disposition: A | Discharge status: Home / Self Care | Payer: Medicare Other | Attending: Internal Medicine | Admitting: Internal Medicine

## 2018-10-13 ENCOUNTER — Other Ambulatory Visit: Payer: Self-pay

## 2018-10-13 ENCOUNTER — Emergency Department (HOSPITAL_COMMUNITY): Payer: Medicare Other

## 2018-10-13 ENCOUNTER — Encounter (HOSPITAL_COMMUNITY): Payer: Self-pay

## 2018-10-13 DIAGNOSIS — Z20828 Contact with and (suspected) exposure to other viral communicable diseases: Secondary | ICD-10-CM | POA: Diagnosis present

## 2018-10-13 DIAGNOSIS — G20 Parkinson's disease: Secondary | ICD-10-CM | POA: Diagnosis present

## 2018-10-13 DIAGNOSIS — R2689 Other abnormalities of gait and mobility: Secondary | ICD-10-CM | POA: Diagnosis not present

## 2018-10-13 DIAGNOSIS — F17200 Nicotine dependence, unspecified, uncomplicated: Secondary | ICD-10-CM | POA: Diagnosis present

## 2018-10-13 DIAGNOSIS — F209 Schizophrenia, unspecified: Secondary | ICD-10-CM

## 2018-10-13 DIAGNOSIS — F79 Unspecified intellectual disabilities: Secondary | ICD-10-CM | POA: Diagnosis present

## 2018-10-13 DIAGNOSIS — F0390 Unspecified dementia without behavioral disturbance: Secondary | ICD-10-CM | POA: Diagnosis present

## 2018-10-13 DIAGNOSIS — Z880 Allergy status to penicillin: Secondary | ICD-10-CM | POA: Diagnosis not present

## 2018-10-13 DIAGNOSIS — I10 Essential (primary) hypertension: Secondary | ICD-10-CM | POA: Diagnosis present

## 2018-10-13 DIAGNOSIS — F0280 Dementia in other diseases classified elsewhere without behavioral disturbance: Secondary | ICD-10-CM | POA: Diagnosis present

## 2018-10-13 DIAGNOSIS — N400 Enlarged prostate without lower urinary tract symptoms: Secondary | ICD-10-CM | POA: Diagnosis present

## 2018-10-13 DIAGNOSIS — Z79899 Other long term (current) drug therapy: Secondary | ICD-10-CM | POA: Diagnosis not present

## 2018-10-13 DIAGNOSIS — L03115 Cellulitis of right lower limb: Secondary | ICD-10-CM | POA: Diagnosis not present

## 2018-10-13 DIAGNOSIS — F419 Anxiety disorder, unspecified: Secondary | ICD-10-CM | POA: Diagnosis present

## 2018-10-13 DIAGNOSIS — R600 Localized edema: Secondary | ICD-10-CM | POA: Diagnosis not present

## 2018-10-13 DIAGNOSIS — L0390 Cellulitis, unspecified: Secondary | ICD-10-CM | POA: Diagnosis present

## 2018-10-13 DIAGNOSIS — Z03818 Encounter for observation for suspected exposure to other biological agents ruled out: Secondary | ICD-10-CM | POA: Diagnosis not present

## 2018-10-13 DIAGNOSIS — G20A1 Parkinson's disease without dyskinesia, without mention of fluctuations: Secondary | ICD-10-CM | POA: Diagnosis present

## 2018-10-13 DIAGNOSIS — M7989 Other specified soft tissue disorders: Secondary | ICD-10-CM | POA: Diagnosis not present

## 2018-10-13 LAB — COMPREHENSIVE METABOLIC PANEL
ALT: 5 U/L (ref 0–44)
AST: 16 U/L (ref 15–41)
Albumin: 3.3 g/dL — ABNORMAL LOW (ref 3.5–5.0)
Alkaline Phosphatase: 56 U/L (ref 38–126)
Anion gap: 9 (ref 5–15)
BUN: 18 mg/dL (ref 8–23)
CO2: 35 mmol/L — ABNORMAL HIGH (ref 22–32)
Calcium: 9 mg/dL (ref 8.9–10.3)
Chloride: 97 mmol/L — ABNORMAL LOW (ref 98–111)
Creatinine, Ser: 1.29 mg/dL — ABNORMAL HIGH (ref 0.61–1.24)
GFR calc Af Amer: >60 mL/min (ref >60)
GFR calc non Af Amer: 57 mL/min — ABNORMAL LOW (ref >60)
Glucose, Bld: 82 mg/dL (ref 70–99)
Potassium: 3.3 mmol/L — ABNORMAL LOW (ref 3.5–5.1)
Sodium: 141 mmol/L (ref 135–145)
Total Bilirubin: 1.2 mg/dL (ref 0.3–1.2)
Total Protein: 7 g/dL (ref 6.5–8.1)

## 2018-10-13 LAB — CBC WITH DIFFERENTIAL/PLATELET
Abs Immature Granulocytes: 0.1 10*3/uL — ABNORMAL HIGH (ref 0.00–0.07)
Basophils Absolute: 0 10*3/uL (ref 0.0–0.1)
Basophils Relative: 0 %
Eosinophils Absolute: 0 10*3/uL (ref 0.0–0.5)
Eosinophils Relative: 0 %
HCT: 38.5 % — ABNORMAL LOW (ref 39.0–52.0)
Hemoglobin: 11.5 g/dL — ABNORMAL LOW (ref 13.0–17.0)
Immature Granulocytes: 1 %
Lymphocytes Relative: 25 %
Lymphs Abs: 2 10*3/uL (ref 0.7–4.0)
MCH: 27.3 pg (ref 26.0–34.0)
MCHC: 29.9 g/dL — ABNORMAL LOW (ref 30.0–36.0)
MCV: 91.2 fL (ref 80.0–100.0)
Monocytes Absolute: 1 10*3/uL (ref 0.1–1.0)
Monocytes Relative: 12 %
Neutro Abs: 5 10*3/uL (ref 1.7–7.7)
Neutrophils Relative %: 62 %
Platelets: 219 10*3/uL (ref 150–400)
RBC: 4.22 MIL/uL (ref 4.22–5.81)
RDW: 15.3 % (ref 11.5–15.5)
WBC: 8.2 10*3/uL (ref 4.0–10.5)
nRBC: 0 % (ref 0.0–0.2)

## 2018-10-13 LAB — BRAIN NATRIURETIC PEPTIDE: B Natriuretic Peptide: 63 pg/mL (ref 0.0–100.0)

## 2018-10-13 LAB — SARS CORONAVIRUS 2 BY RT PCR (HOSPITAL ORDER, PERFORMED IN ~~LOC~~ HOSPITAL LAB): SARS Coronavirus 2: NEGATIVE

## 2018-10-13 LAB — MRSA PCR SCREENING: MRSA by PCR: NEGATIVE

## 2018-10-13 MED ORDER — CLOZAPINE 100 MG PO TABS
100.0000 mg | ORAL_TABLET | Freq: Every day | ORAL | Status: DC
  Administered 2018-10-13 – 2018-10-15 (×2): 100 mg via ORAL
  Filled 2018-10-13 (×5): qty 1

## 2018-10-13 MED ORDER — TAMSULOSIN HCL 0.4 MG PO CAPS
0.4000 mg | ORAL_CAPSULE | Freq: Every day | ORAL | Status: DC
  Administered 2018-10-14 – 2018-10-16 (×3): 0.4 mg via ORAL
  Filled 2018-10-13 (×3): qty 1

## 2018-10-13 MED ORDER — ONDANSETRON HCL 4 MG PO TABS: 4.0000 mg | ORAL_TABLET | Freq: Four times a day (QID) | ORAL | Status: DC | PRN

## 2018-10-13 MED ORDER — TORSEMIDE 20 MG PO TABS
40.0000 mg | ORAL_TABLET | Freq: Two times a day (BID) | ORAL | Status: DC
  Administered 2018-10-13 – 2018-10-16 (×6): 40 mg via ORAL
  Filled 2018-10-13 (×6): qty 2

## 2018-10-13 MED ORDER — ENOXAPARIN SODIUM 40 MG/0.4ML ~~LOC~~ SOLN
40.0000 mg | SUBCUTANEOUS | Status: DC
  Administered 2018-10-13 – 2018-10-15 (×3): 40 mg via SUBCUTANEOUS
  Filled 2018-10-13 (×3): qty 0.4

## 2018-10-13 MED ORDER — BUSPIRONE HCL 5 MG PO TABS
15.0000 mg | ORAL_TABLET | Freq: Two times a day (BID) | ORAL | Status: DC
  Administered 2018-10-13 – 2018-10-16 (×5): 15 mg via ORAL
  Filled 2018-10-13 (×6): qty 3

## 2018-10-13 MED ORDER — VANCOMYCIN HCL IN DEXTROSE 1-5 GM/200ML-% IV SOLN
1000.0000 mg | Freq: Once | INTRAVENOUS | Status: AC
  Administered 2018-10-13: 1000 mg via INTRAVENOUS
  Filled 2018-10-13: qty 200

## 2018-10-13 MED ORDER — CLONAZEPAM 0.25 MG PO TBDP
0.2500 mg | ORAL_TABLET | Freq: Every day | ORAL | Status: DC | PRN
  Administered 2018-10-13 – 2018-10-16 (×3): 0.25 mg via ORAL
  Filled 2018-10-13 (×4): qty 1

## 2018-10-13 MED ORDER — POTASSIUM CHLORIDE CRYS ER 20 MEQ PO TBCR
40.0000 meq | EXTENDED_RELEASE_TABLET | Freq: Two times a day (BID) | ORAL | Status: DC
  Administered 2018-10-13 – 2018-10-16 (×7): 40 meq via ORAL
  Filled 2018-10-13 (×6): qty 2

## 2018-10-13 MED ORDER — MAGNESIUM OXIDE 400 (241.3 MG) MG PO TABS
400.0000 mg | ORAL_TABLET | Freq: Two times a day (BID) | ORAL | Status: DC
  Administered 2018-10-13 – 2018-10-16 (×6): 400 mg via ORAL
  Filled 2018-10-13 (×10): qty 1

## 2018-10-13 MED ORDER — HYDROCODONE-ACETAMINOPHEN 5-325 MG PO TABS
1.0000 | ORAL_TABLET | ORAL | Status: DC | PRN
  Administered 2018-10-13: 1 via ORAL
  Administered 2018-10-14: 2 via ORAL
  Filled 2018-10-13: qty 1
  Filled 2018-10-13: qty 2

## 2018-10-13 MED ORDER — ACETAMINOPHEN 650 MG RE SUPP: 650.0000 mg | Freq: Four times a day (QID) | RECTAL | Status: DC | PRN

## 2018-10-13 MED ORDER — SODIUM CHLORIDE 0.9 % IV SOLN
INTRAVENOUS | Status: DC
  Administered 2018-10-13: 11:00:00 via INTRAVENOUS

## 2018-10-13 MED ORDER — ONDANSETRON HCL 4 MG/2ML IJ SOLN: 4.0000 mg | Freq: Four times a day (QID) | INTRAMUSCULAR | Status: DC | PRN

## 2018-10-13 MED ORDER — CARBIDOPA-LEVODOPA 25-100 MG PO TABS
2.0000 | ORAL_TABLET | Freq: Four times a day (QID) | ORAL | Status: DC
  Administered 2018-10-13 – 2018-10-16 (×11): 2 via ORAL
  Filled 2018-10-13 (×12): qty 2

## 2018-10-13 MED ORDER — RASAGILINE MESYLATE 1 MG PO TABS
1.0000 mg | ORAL_TABLET | Freq: Every day | ORAL | Status: DC
  Administered 2018-10-14 – 2018-10-16 (×3): 1 mg via ORAL
  Filled 2018-10-13 (×5): qty 1

## 2018-10-13 MED ORDER — CLINDAMYCIN PHOSPHATE 600 MG/50ML IV SOLN
600.0000 mg | Freq: Three times a day (TID) | INTRAVENOUS | Status: DC
  Administered 2018-10-13 – 2018-10-16 (×10): 600 mg via INTRAVENOUS
  Filled 2018-10-13 (×10): qty 50

## 2018-10-13 MED ORDER — ACETAMINOPHEN 325 MG PO TABS: 650.0000 mg | ORAL_TABLET | Freq: Four times a day (QID) | ORAL | Status: DC | PRN

## 2018-10-13 MED ORDER — NICOTINE 14 MG/24HR TD PT24
14.0000 mg | MEDICATED_PATCH | Freq: Every day | TRANSDERMAL | Status: DC
  Administered 2018-10-13 – 2018-10-16 (×4): 14 mg via TRANSDERMAL
  Filled 2018-10-13 (×4): qty 1

## 2018-10-13 MED ORDER — DONEPEZIL HCL 5 MG PO TABS
10.0000 mg | ORAL_TABLET | Freq: Every day | ORAL | Status: DC
  Administered 2018-10-13 – 2018-10-15 (×2): 10 mg via ORAL
  Filled 2018-10-13 (×3): qty 2

## 2018-10-13 NOTE — ED Provider Notes (Signed)
[facility] Provider Note   CSN: [id number] Arrival date & time: 10/13/18  1018     History   Chief Complaint Chief Complaint  Patient presents with  . Cellulitis    HPI [name] is a 68 y.o. male.     Patient sent in from Dr. Josephine Cables office for concerns for cellulitis to the right lower extremity.  Patient has a history of mental retardation as well as Parkinson's disease and a history of psychosis.  And dementia.  Patient's from a group home setting.  He is here with a provider from the group home.  It is not clear how long the leg swelling is been ongoing but it they state that it was obvious yesterday.  He started complaining of pain into today.     Past Medical History:  Diagnosis Date  . Cellulitis   . Dementia ([place])   . Edema   . Hypertension   . Hypoglycemia   . Mental retardation   . Parkinson's disease ([place])   . Psychosis ([place])   . Renal insufficiency     Patient Active Problem List   Diagnosis Date Noted  . Encounter for screening colonoscopy 01/30/2015    Past Surgical History:  Procedure Laterality Date  . COLONOSCOPY N/A 03/12/2015   Procedure: COLONOSCOPY;  Surgeon: Danie Binder, MD;  Location[facility];  Service: Endoscopy;  Laterality: N/A;  1430 - moved to 1/16 @ 1:45 - office to notify        Home Medications    Prior to Admission medications   Medication Sig Start Date End Date Taking? Authorizing Provider  acetaminophen (Q-PAP) 500 MG tablet Take 500 mg by mouth every 6 (six) hours as needed. For pain    [provider]  busPIRone (BUSPAR) 15 MG tablet Take 15 mg by mouth 2 (two) times daily.    [provider]  carbidopa-levodopa (SINEMET IR) 25-100 MG per tablet Take 2 tablets by mouth 4 (four) times daily.    [provider]  clonazePAM (KLONOPIN) 0.5 MG tablet Take 0.25 mg by mouth daily as needed for anxiety.    [provider]  cloZAPine (CLOZARIL) 100 MG  tablet Take 100 mg by mouth at bedtime.    [provider]  donepezil (ARICEPT) 10 MG tablet Take 10 mg by mouth daily.    [provider]  magnesium oxide (MAG-OX) 400 MG tablet Take 400 mg by mouth 2 (two) times daily.    [provider]  potassium chloride SA (K-DUR,KLOR-CON) 20 MEQ tablet Take 40 mEq by mouth 2 (two) times daily.    [provider]  rasagiline (AZILECT) 0.5 MG TABS tablet Take 1 mg by mouth daily.    [provider]  silver sulfADIAZINE (SILVADENE) 1 % cream Apply 1 application topically daily.    [provider]  tamsulosin (FLOMAX) 0.4 MG CAPS capsule Take 0.4 mg by mouth daily.    [provider]  torsemide (DEMADEX) 20 MG tablet Take 40 mg by mouth 2 (two) times daily.    [provider]    Family History Family History  Problem Relation Age of Onset  . Colon cancer Other        Unknown family history    Social History Social History   Tobacco Use  . Smoking status: Current Every Day Smoker  . Smokeless tobacco: Never Used  Substance Use Topics  . Alcohol use: No    Alcohol/week: 0.0  standard drinks  . Drug use: No     Allergies   Penicillins   Review of Systems Review of Systems  Unable to perform ROS: Dementia     Physical Exam Updated Vital Signs BP (!) 124/92   Pulse 70 Comment: Simultaneous filing. User may not have seen previous data.  Temp 98.2 F (36.8 C) (Oral) Comment: Simultaneous filing. User may not have seen previous data. Comment (Src): Simultaneous filing. User may not have seen previous data.  Resp 19   Ht 1.676 m (5\' 6" )   Wt 81 kg   SpO2 99% Comment: Simultaneous filing. User may not have seen previous data.  BMI 28.82 kg/m   Physical Exam Vitals signs and nursing note reviewed.  Constitutional:      Appearance: Normal appearance. He is well-developed.  HENT:     Head: Normocephalic and atraumatic.  Eyes:     Conjunctiva/sclera: Conjunctivae  normal.  Neck:     Musculoskeletal: Neck supple.  Cardiovascular:     Rate and Rhythm: Normal rate and regular rhythm.     Heart sounds: No murmur.  Pulmonary:     Effort: Pulmonary effort is normal. No respiratory distress.     Breath sounds: Normal breath sounds.  Abdominal:     Palpations: Abdomen is soft.     Tenderness: There is no abdominal tenderness.  Musculoskeletal:        General: Swelling present.     Right lower leg: Edema present.     Left lower leg: Edema present.     Comments: Marked swelling to the right lower extremity with erythema up to the knee.  And erythema on the foot.  Increased warmth.  Minimal swelling to the left lower extremity without any significant erythema.  Good cap refill to both feet.  Skin:    General: Skin is warm and dry.     Capillary Refill: Capillary refill takes less than 2 seconds.  Neurological:     Mental Status: He is alert. Mental status is at baseline.     Comments: Patient has a history of behavioral health and developmental delay.      ED Treatments / Results  Labs (all labs ordered are listed, but only abnormal results are displayed) Labs Reviewed  CBC WITH DIFFERENTIAL/PLATELET - Abnormal; Notable for the following components:      Result Value   Hemoglobin 11.5 (*)    HCT 38.5 (*)    MCHC 29.9 (*)    Abs Immature Granulocytes 0.10 (*)    All other components within normal limits  COMPREHENSIVE METABOLIC PANEL - Abnormal; Notable for the following components:   Potassium 3.3 (*)    Chloride 97 (*)    CO2 35 (*)    Creatinine, Ser 1.29 (*)    Albumin 3.3 (*)    GFR calc non Af Amer 57 (*)    All other components within normal limits  BRAIN NATRIURETIC PEPTIDE    EKG None  Radiology US Venous Img Lower Right (dvt Study)  Result Date: 10/13/2018 CLINICAL DATA:  68 year old with pain and swelling in the right lower extremity. EXAM: RIGHT LOWER EXTREMITY VENOUS DOPPLER ULTRASOUND TECHNIQUE: Gray-scale sonography  with graded compression, as well as color Doppler and duplex ultrasound were performed to evaluate the lower extremity deep venous systems from the level of the common femoral vein and including the common femoral, femoral, profunda femoral, popliteal and calf veins including the posterior tibial, peroneal and gastrocnemius veins when visible. The superficial great  saphenous vein was also interrogated. Spectral Doppler was utilized to evaluate flow at rest and with distal augmentation maneuvers in the common femoral, femoral and popliteal veins. COMPARISON:  09/13/2011 FINDINGS: Contralateral Common Femoral Vein: Respiratory phasicity is normal and symmetric with the symptomatic side. No evidence of thrombus. Normal compressibility. Common Femoral Vein: No evidence of thrombus. Normal compressibility, respiratory phasicity and response to augmentation. Saphenofemoral Junction: No evidence of thrombus. Normal compressibility and flow on color Doppler imaging. Profunda Femoral Vein: No evidence of thrombus. Normal compressibility and flow on color Doppler imaging. Femoral Vein: No evidence of thrombus. Normal compressibility, respiratory phasicity and response to augmentation. Popliteal Vein: No evidence of thrombus. Normal compressibility, respiratory phasicity and response to augmentation. Calf Veins: Visualized right deep calf veins are patent without thrombus. Superficial Great Saphenous Vein: No evidence of thrombus. Normal compressibility. Other Findings:  Subcutaneous edema. IMPRESSION: Negative for deep venous thrombosis in right lower extremity. Electronically Signed   By: Markus Daft M.D.   On: 10/13/2018 11:53   Dg Chest Port 1 View  Result Date: 10/13/2018 CLINICAL DATA:  Leg swelling EXAM: PORTABLE CHEST 1 VIEW COMPARISON:  09/11/2017 FINDINGS: Cardiac shadows within normal limits. The lungs are well aerated bilaterally. No focal infiltrate or sizable effusion is noted. No bony abnormality is noted.  IMPRESSION: No acute abnormality seen. Electronically Signed   By: Inez Catalina M.D.   On: 10/13/2018 11:23    Procedures Procedures (including critical care time)  Medications Ordered in ED Medications  0.9 %  sodium chloride infusion ( Intravenous New Bag/Given 10/13/18 1120)  vancomycin (VANCOCIN) IVPB 1000 mg/200 mL premix (has no administration in time range)     Initial Impression / Assessment and Plan / ED Course  I have reviewed the triage vital signs and the nursing notes.  Pertinent labs & imaging results that were available during my care of the patient were reviewed by me and considered in my medical decision making (see chart for details).      Patient with significant cellulitis to his right lower extremity.  Work-up without any significant abnormalities patient has penicillin allergy so started on vancomycin instead of just Ancef.  Discussed with hospitalist who will admit.  Patient very cooperative here.  Chest x-ray without any acute findings.  Patient had Doppler done to his right lower extremity without any evidence of deep vein thrombosis.    Final Clinical Impressions(s) / ED Diagnoses   Final diagnoses:  Cellulitis of right lower extremity    ED Discharge Orders    None       Fredia Sorrow, MD 10/13/18 1657

## 2018-10-13 NOTE — Progress Notes (Signed)
Patient is high fall risk and cognitively impaired. Fall precautions in place including bed alarm, fall mats at either side of bed and yellow fall risk bracelet. However, patient has refused yellow non skid socks and became agitated when attempts were made to place socks.

## 2018-10-13 NOTE — ED Notes (Signed)
ED TO INPATIENT HANDOFF REPORT  ED Nurse Name and Phone #: [phone]  S Name/Age/Gender [name] 68 y.o. male Room/Bed: APA11/APA11  Code Status   Code Status: Not on file  Home/SNF/Other Skilled nursing facility Patient oriented to: self Is this baseline? Yes   Triage Complete: Triage complete  Chief Complaint CELLULITIS-RIGHT LEG  Triage Note Pt resident of [place].  Dr. Josephine Cables office sent pt to ER for eval of cellulitis in r leg.  Pt c/o pain.  Staff says pt's leg has been red and swollen since yesterday.    Allergies Allergies  Allergen Reactions  . Penicillins     Level of Care/Admitting Diagnosis ED Disposition    ED Disposition Condition [place] [facility]: [facility] [[id number]]  Level of Care: Med-Surg [16]  Covid Evaluation: Asymptomatic Screening Protocol (No Symptoms)  Diagnosis: Cellulitis [989211]  Admitting Physician: Kathie Dike [[id number]]  Attending Physician: Kathie Dike [[id number]]  Estimated length of stay: past midnight tomorrow  Certification:: I certify this patient will need inpatient services for at least 2 midnights  PT Class (Do Not Modify): Inpatient [101]  PT Acc Code (Do Not Modify): Private [1]       B Medical/Surgery History Past Medical History:  Diagnosis Date  . Cellulitis   . Dementia ([place])   . Edema   . Hypertension   . Hypoglycemia   . Mental retardation   . Parkinson's disease ([place])   . Psychosis ([place])   . Renal insufficiency    Past Surgical History:  Procedure Laterality Date  . COLONOSCOPY N/A 03/12/2015   Procedure: COLONOSCOPY;  Surgeon: Danie Binder, MD;  [facility];  Service: Endoscopy;  Laterality: N/A;  1430 - moved to 1/16 @ 1:45 - office to notify     A IV Location/Drains/Wounds Patient Lines/Drains/Airways Status   Active Line/Drains/Airways    Name:   Placement date:   Placement time:   Site:   Days:   Peripheral IV 10/13/18 Left  Antecubital   10/13/18    1117    Antecubital   less than 1   PICC / Midline Single Lumen 09/16/11 PICC Right Brachial   09/16/11    1046    Brachial   2584          Intake/Output Last 24 hours  Intake/Output Summary (Last 24 hours) at 10/13/2018 1540 Last data filed at 10/13/2018 1519 Gross per 24 hour  Intake 200.31 ml  Output -  Net 200.31 ml    Labs/Imaging Results for orders placed or performed during the hospital encounter of 10/13/18 (from the past 48 hour(s))  CBC with Differential/Platelet     Status: Abnormal   Collection Time: 10/13/18 11:18 AM  Result Value Ref Range   WBC 8.2 4.0 - 10.5 K/uL   RBC 4.22 4.22 - 5.81 MIL/uL   Hemoglobin 11.5 (L) 13.0 - 17.0 g/dL   HCT 38.5 (L) 39.0 - 52.0 %   MCV 91.2 80.0 - 100.0 fL   MCH 27.3 26.0 - 34.0 pg   MCHC 29.9 (L) 30.0 - 36.0 g/dL   RDW 15.3 11.5 - 15.5 %   Platelets 219 150 - 400 K/uL   nRBC 0.0 0.0 - 0.2 %   Neutrophils Relative % 62 %   Neutro Abs 5.0 1.7 - 7.7 K/uL   Lymphocytes Relative 25 %   Lymphs Abs 2.0 0.7 - 4.0 K/uL   Monocytes Relative 12 %   Monocytes  Absolute 1.0 0.1 - 1.0 K/uL   Eosinophils Relative 0 %   Eosinophils Absolute 0.0 0.0 - 0.5 K/uL   Basophils Relative 0 %   Basophils Absolute 0.0 0.0 - 0.1 K/uL   Immature Granulocytes 1 %   Abs Immature Granulocytes 0.10 (H) 0.00 - 0.07 K/uL    Comment: Performed at [facility], [address], [place], [place] [address]  Comprehensive metabolic panel     Status: Abnormal   Collection Time: 10/13/18 11:18 AM  Result Value Ref Range   Sodium 141 135 - 145 mmol/L   Potassium 3.3 (L) 3.5 - 5.1 mmol/L   Chloride 97 (L) 98 - 111 mmol/L   CO2 35 (H) 22 - 32 mmol/L   Glucose, Bld 82 70 - 99 mg/dL   BUN 18 8 - 23 mg/dL   Creatinine, Ser 1.29 (H) 0.61 - 1.24 mg/dL   Calcium 9.0 8.9 - 10.3 mg/dL   Total Protein 7.0 6.5 - 8.1 g/dL   Albumin 3.3 (L) 3.5 - 5.0 g/dL   AST 16 15 - 41 U/L   ALT 5 0 - 44 U/L   Alkaline Phosphatase 56 38 - 126 U/L   Total  Bilirubin 1.2 0.3 - 1.2 mg/dL   GFR calc non Af Amer 57 (L) >60 mL/min   GFR calc Af Amer >60 >60 mL/min   Anion gap 9 5 - 15    Comment: Performed at [facility], [address], [place], [place] [address]  Brain natriuretic peptide     Status: None   Collection Time: 10/13/18 11:18 AM  Result Value Ref Range   B Natriuretic Peptide 63.0 0.0 - 100.0 pg/mL    Comment: Performed at [facility], [address], [place], [place] [address]  SARS Coronavirus 2 [facility] order, Performed in [facility] hospital lab) Nasopharyngeal Nasopharyngeal Swab     Status: None   Collection Time: 10/13/18  2:01 PM   Specimen: Nasopharyngeal Swab  Result Value Ref Range   SARS Coronavirus 2 NEGATIVE NEGATIVE    Comment: (NOTE) If result is NEGATIVE SARS-CoV-2 target nucleic acids are NOT DETECTED. The SARS-CoV-2 RNA is generally detectable in upper and lower  respiratory specimens during the acute phase of infection. The lowest  concentration of SARS-CoV-2 viral copies this assay can detect is 250  copies / mL. A negative result does not preclude SARS-CoV-2 infection  and should not be used as the sole basis for treatment or other  patient management decisions.  A negative result may occur with  improper specimen collection / handling, submission of specimen other  than nasopharyngeal swab, presence of viral mutation(s) within the  areas targeted by this assay, and inadequate number of viral copies  (<250 copies / mL). A negative result must be combined with clinical  observations, patient history, and epidemiological information. If result is POSITIVE SARS-CoV-2 target nucleic acids are DETECTED. The SARS-CoV-2 RNA is generally detectable in upper and lower  respiratory specimens dur ing the acute phase of infection.  Positive  results are indicative of active infection with SARS-CoV-2.  Clinical  correlation with patient history and other diagnostic information is  necessary to determine patient  infection status.  Positive results do  not rule out bacterial infection or co-infection with other viruses. If result is PRESUMPTIVE POSTIVE SARS-CoV-2 nucleic acids MAY BE PRESENT.   A presumptive positive result was obtained on the submitted specimen  and confirmed on repeat testing.  While 2019 novel coronavirus  (SARS-CoV-2) nucleic  acids may be present in the submitted sample  additional confirmatory testing may be necessary for epidemiological  and / or clinical management purposes  to differentiate between  SARS-CoV-2 and other Sarbecovirus currently known to infect humans.  If clinically indicated additional testing with an alternate test  methodology [phone]) is advised. The SARS-CoV-2 RNA is generally  detectable in upper and lower respiratory sp ecimens during the acute  phase of infection. The expected result is Negative. Fact Sheet for Patients:  StrictlyIdeas.no Fact Sheet for Healthcare Providers: BankingDealers.co.za This test is not yet approved or cleared by the [place] FDA and has been authorized for detection and/or diagnosis of SARS-CoV-2 by FDA under an Emergency Use Authorization (EUA).  This EUA will remain in effect (meaning this test can be used) for the duration of the COVID-19 declaration under Section 564(b)(1) of the Act, 21 U.S.C. section 360bbb-3(b)(1), unless the authorization is terminated or revoked sooner. Performed at [facility], [address], [place], [place] [address]    US Venous Img Lower Right (dvt Study)  Result Date: 10/13/2018 CLINICAL DATA:  68 year old with pain and swelling in the right lower extremity. EXAM: RIGHT LOWER EXTREMITY VENOUS DOPPLER ULTRASOUND TECHNIQUE: Gray-scale sonography with graded compression, as well as color Doppler and duplex ultrasound were performed to evaluate the lower extremity deep venous systems from the level of the common femoral vein and including  the common femoral, femoral, profunda femoral, popliteal and calf veins including the posterior tibial, peroneal and gastrocnemius veins when visible. The superficial great saphenous vein was also interrogated. Spectral Doppler was utilized to evaluate flow at rest and with distal augmentation maneuvers in the common femoral, femoral and popliteal veins. COMPARISON:  09/13/2011 FINDINGS: Contralateral Common Femoral Vein: Respiratory phasicity is normal and symmetric with the symptomatic side. No evidence of thrombus. Normal compressibility. Common Femoral Vein: No evidence of thrombus. Normal compressibility, respiratory phasicity and response to augmentation. Saphenofemoral Junction: No evidence of thrombus. Normal compressibility and flow on color Doppler imaging. Profunda Femoral Vein: No evidence of thrombus. Normal compressibility and flow on color Doppler imaging. Femoral Vein: No evidence of thrombus. Normal compressibility, respiratory phasicity and response to augmentation. Popliteal Vein: No evidence of thrombus. Normal compressibility, respiratory phasicity and response to augmentation. Calf Veins: Visualized right deep calf veins are patent without thrombus. Superficial Great Saphenous Vein: No evidence of thrombus. Normal compressibility. Other Findings:  Subcutaneous edema. IMPRESSION: Negative for deep venous thrombosis in right lower extremity. Electronically Signed   By: Markus Daft M.D.   On: 10/13/2018 11:53   Dg Chest Port 1 View  Result Date: 10/13/2018 CLINICAL DATA:  Leg swelling EXAM: PORTABLE CHEST 1 VIEW COMPARISON:  09/11/2017 FINDINGS: Cardiac shadows within normal limits. The lungs are well aerated bilaterally. No focal infiltrate or sizable effusion is noted. No bony abnormality is noted. IMPRESSION: No acute abnormality seen. Electronically Signed   By: Inez Catalina M.D.   On: 10/13/2018 11:23    Pending Labs Unresulted Labs (From admission, onward)   None       Vitals/Pain Today's Vitals   10/13/18 1300 10/13/18 1330 10/13/18 1430 10/13/18 1500  BP: (!) 124/92 (!) 110/48 (!) 148/76 (!) 113/97  Pulse:      Resp: 19 13 18  (!) 36  Temp:      TempSrc:      SpO2:      Weight:      Height:        Isolation Precautions No active isolations  Medications Medications  0.9 %  sodium chloride infusion ( Intravenous New Bag/Given 10/13/18 1120)  vancomycin (VANCOCIN) IVPB 1000 mg/200 mL premix (0 mg Intravenous Stopped 10/13/18 1519)    Mobility walks High fall risk   Focused Assessments    R Recommendations: See Admitting Provider Note  Report given to:   Additional Notes:

## 2018-10-13 NOTE — H&P (Signed)
History and Physical    [name] EVO:[id number] DOB: 22-Jun-1950 DOA: 10/13/2018  PCP: Rosita Fire, MD  Patient coming from: Family care home  I have personally briefly reviewed patient's old medical records in [place]  Chief Complaint: Right leg swelling  HPI: [name] is a 68 y.o. male with medical history significant of mental retardation, schizophrenia, Parkinson's, BPH, dementia, who is a resident of family care home for the past 20+ years.  Patient is unable to provide any meaningful history.  After discussing with family care home staff, it was reported that patient's swelling and erythema of right lower extremity began yesterday.  He has not had any fever.  He normally ambulates independently.  He has not had any wounds to this area.  He was sent to his primary care physician today where he was evaluated and subsequently sent to the emergency room for further treatments.  ED Course: WBC count was noted to be normal.  Venous Dopplers of the lower extremity did not show any DVT.  Chemistry also unrevealing.  Clinically, he was noted to have significant cellulitis of his right lower extremity.  He has been referred for admission.  Review of Systems: As per HPI otherwise 10 point review of systems negative.    Past Medical History:  Diagnosis Date  . Cellulitis   . Dementia ([place])   . Edema   . Hypertension   . Hypoglycemia   . Mental retardation   . Parkinson's disease ([place])   . Psychosis ([place])   . Renal insufficiency     Past Surgical History:  Procedure Laterality Date  . COLONOSCOPY N/A 03/12/2015   Procedure: COLONOSCOPY;  Surgeon: Danie Binder, MD;  [facility];  Service: Endoscopy;  Laterality: N/A;  1430 - moved to 1/16 @ 1:45 - office to notify    Social History:  reports that he has been smoking. He has never used smokeless tobacco. He reports that he does not drink alcohol or use drugs.  Allergies  Allergen  Reactions  . Penicillins     Family History  Problem Relation Age of Onset  . Colon cancer Other        Unknown family history    Prior to Admission medications   Medication Sig Start Date End Date Taking? Authorizing Provider  acetaminophen (Q-PAP) 500 MG tablet Take 500 mg by mouth every 6 (six) hours as needed. For pain   Yes [provider]  busPIRone (BUSPAR) 15 MG tablet Take 15 mg by mouth 2 (two) times daily.   Yes [provider]  carbidopa-levodopa (SINEMET IR) 25-100 MG per tablet Take 2 tablets by mouth 4 (four) times daily.   Yes [provider]  clonazePAM (KLONOPIN) 0.5 MG tablet Take 0.25 mg by mouth daily as needed for anxiety.   Yes [provider]  cloZAPine (CLOZARIL) 100 MG tablet Take 100 mg by mouth at bedtime.   Yes [provider]  donepezil (ARICEPT) 10 MG tablet Take 10 mg by mouth daily.   Yes [provider]  magnesium oxide (MAG-OX) 400 MG tablet Take 400 mg by mouth 2 (two) times daily.   Yes [provider]  potassium chloride SA (K-DUR,KLOR-CON) 20 MEQ tablet Take 40 mEq by mouth 2 (two) times daily.   Yes [provider]  rasagiline (AZILECT) 0.5 MG TABS tablet Take 1 mg by mouth daily.   Yes [provider]  silver sulfADIAZINE (SILVADENE) 1 % cream Apply  1 application topically daily.   Yes [provider]  tamsulosin (FLOMAX) 0.4 MG CAPS capsule Take 0.4 mg by mouth daily.   Yes [provider]  torsemide (DEMADEX) 20 MG tablet Take 40 mg by mouth 2 (two) times daily.   Yes [provider]    Physical Exam: Vitals:   10/13/18 1330 10/13/18 1430 10/13/18 1500 10/13/18 1638  BP: (!) 110/48 (!) 148/76 (!) 113/97 118/66  Pulse:    82  Resp: 13 18 (!) 36 (!) 22  Temp:    98.6 F (37 C)  TempSrc:    Oral  SpO2:      Weight:    69.5 kg  Height:        Constitutional: NAD, calm, comfortable Eyes: PERRL, lids and conjunctivae normal ENMT:  Mucous membranes are moist. Posterior pharynx clear of any exudate or lesions.Normal dentition.  Neck: normal, supple, no masses, no thyromegaly Respiratory: clear to auscultation bilaterally, no wheezing, no crackles. Normal respiratory effort. No accessory muscle use.  Cardiovascular: Regular rate and rhythm, no murmurs / rubs / gallops. No extremity edema. 2+ pedal pulses. No carotid bruits.  Abdomen: no tenderness, no masses palpated. No hepatosplenomegaly. Bowel sounds positive.  Musculoskeletal: Swelling of right lower extremity Skin: Erythema noted on right lower extremity with significant swelling of right foot to the knee Neurologic: CN 2-12 grossly intact. Sensation intact, DTR normal. Strength 5/5 in all 4.  Psychiatric: Speech is incomprehensible   Labs on Admission: I have personally reviewed following labs and imaging studies  CBC: Recent Labs  Lab 10/13/18 1118  WBC 8.2  NEUTROABS 5.0  HGB 11.5*  HCT 38.5*  MCV 91.2  PLT 073   Basic Metabolic Panel: Recent Labs  Lab 10/13/18 1118  NA 141  K 3.3*  CL 97*  CO2 35*  GLUCOSE 82  BUN 18  CREATININE 1.29*  CALCIUM 9.0   GFR: Estimated Creatinine Clearance: 49.5 mL/min (A) (by C-G formula based on SCr of 1.29 mg/dL (H)). Liver Function Tests: Recent Labs  Lab 10/13/18 1118  AST 16  ALT 5  ALKPHOS 56  BILITOT 1.2  PROT 7.0  ALBUMIN 3.3*   No results for input(s): LIPASE, AMYLASE in the last 168 hours. No results for input(s): AMMONIA in the last 168 hours. Coagulation Profile: No results for input(s): INR, PROTIME in the last 168 hours. Cardiac Enzymes: No results for input(s): CKTOTAL, CKMB, CKMBINDEX, TROPONINI in the last 168 hours. BNP (last 3 results) No results for input(s): PROBNP in the last 8760 hours. HbA1C: No results for input(s): HGBA1C in the last 72 hours. CBG: No results for input(s): GLUCAP in the last 168 hours. Lipid Profile: No results for input(s): CHOL, HDL, LDLCALC, TRIG,  CHOLHDL, LDLDIRECT in the last 72 hours. Thyroid Function Tests: No results for input(s): TSH, T4TOTAL, FREET4, T3FREE, THYROIDAB in the last 72 hours. Anemia Panel: No results for input(s): VITAMINB12, FOLATE, FERRITIN, TIBC, IRON, RETICCTPCT in the last 72 hours. Urine analysis:    Component Value Date/Time   COLORURINE YELLOW 01/08/2016 1122   APPEARANCEUR CLEAR 01/08/2016 1122   LABSPEC <1.005 (L) 01/08/2016 1122   PHURINE 5.5 01/08/2016 1122   GLUCOSEU NEGATIVE 01/08/2016 1122   HGBUR NEGATIVE 01/08/2016 1122   BILIRUBINUR NEGATIVE 01/08/2016 1122   KETONESUR NEGATIVE 01/08/2016 1122   PROTEINUR NEGATIVE 01/08/2016 1122   UROBILINOGEN 0.2 09/12/2011 1650   NITRITE NEGATIVE 01/08/2016 1122   LEUKOCYTESUR NEGATIVE 01/08/2016 1122    Radiological Exams on Admission: US Venous  Img Lower Right (dvt Study)  Result Date: 10/13/2018 CLINICAL DATA:  68 year old with pain and swelling in the right lower extremity. EXAM: RIGHT LOWER EXTREMITY VENOUS DOPPLER ULTRASOUND TECHNIQUE: Gray-scale sonography with graded compression, as well as color Doppler and duplex ultrasound were performed to evaluate the lower extremity deep venous systems from the level of the common femoral vein and including the common femoral, femoral, profunda femoral, popliteal and calf veins including the posterior tibial, peroneal and gastrocnemius veins when visible. The superficial great saphenous vein was also interrogated. Spectral Doppler was utilized to evaluate flow at rest and with distal augmentation maneuvers in the common femoral, femoral and popliteal veins. COMPARISON:  09/13/2011 FINDINGS: Contralateral Common Femoral Vein: Respiratory phasicity is normal and symmetric with the symptomatic side. No evidence of thrombus. Normal compressibility. Common Femoral Vein: No evidence of thrombus. Normal compressibility, respiratory phasicity and response to augmentation. Saphenofemoral Junction: No evidence of thrombus.  Normal compressibility and flow on color Doppler imaging. Profunda Femoral Vein: No evidence of thrombus. Normal compressibility and flow on color Doppler imaging. Femoral Vein: No evidence of thrombus. Normal compressibility, respiratory phasicity and response to augmentation. Popliteal Vein: No evidence of thrombus. Normal compressibility, respiratory phasicity and response to augmentation. Calf Veins: Visualized right deep calf veins are patent without thrombus. Superficial Great Saphenous Vein: No evidence of thrombus. Normal compressibility. Other Findings:  Subcutaneous edema. IMPRESSION: Negative for deep venous thrombosis in right lower extremity. Electronically Signed   By: Markus Daft M.D.   On: 10/13/2018 11:53   Dg Chest Port 1 View  Result Date: 10/13/2018 CLINICAL DATA:  Leg swelling EXAM: PORTABLE CHEST 1 VIEW COMPARISON:  09/11/2017 FINDINGS: Cardiac shadows within normal limits. The lungs are well aerated bilaterally. No focal infiltrate or sizable effusion is noted. No bony abnormality is noted. IMPRESSION: No acute abnormality seen. Electronically Signed   By: Inez Catalina M.D.   On: 10/13/2018 11:23    Assessment/Plan Active Problems:   Cellulitis   Schizophrenia ([place])   Parkinson disease ([place])   Dementia ([place])     1. Right lower extremity cellulitis.  Venous Dopplers negative for DVT.  Per cellulitis order set, started on clindamycin since he does have a penicillin allergy.  Elevate right lower extremity. 2. Parkinson's disease.  Continue on Sinemet. 3. Dementia.  Continue Aricept. 4. Schizophrenia.  Continue on clozapine 5. Anxiety.  Continue Klonopin as needed.  He is also on BuSpar  DVT prophylaxis: Lovenox Code Status: Full code Family Communication: Discussed with brother, Treylon Henard, [phone] who is patient's next of kin Disposition Plan: Return to family care home on discharge once cellulitis has improved Consults called:   Admission status:  Inpatient, MedSurg.  Patient has significant erythema, tenderness and swelling of his left lower extremity.  Anticipate that patient will require at least a 2 midnight stay in the hospital with IV antibiotic therapy in order to achieve adequate improvement in order to transition to oral antibiotics and continue his therapy after discharge.  Kathie Dike MD [facility]   If 7PM-7AM, please contact night-coverage [email or web address]   10/13/2018, 8:07 PM

## 2018-10-13 NOTE — ED Triage Notes (Signed)
Pt resident of [place].  Dr. Josephine Cables office sent pt to ER for eval of cellulitis in r leg.  Pt c/o pain.  Staff says pt's leg has been red and swollen since yesterday.

## 2018-10-14 LAB — COMPREHENSIVE METABOLIC PANEL
ALT: 7 U/L (ref 0–44)
AST: 17 U/L (ref 15–41)
Albumin: 2.9 g/dL — ABNORMAL LOW (ref 3.5–5.0)
Alkaline Phosphatase: 49 U/L (ref 38–126)
Anion gap: 8 (ref 5–15)
BUN: 19 mg/dL (ref 8–23)
CO2: 32 mmol/L (ref 22–32)
Calcium: 8.4 mg/dL — ABNORMAL LOW (ref 8.9–10.3)
Chloride: 101 mmol/L (ref 98–111)
Creatinine, Ser: 1.22 mg/dL (ref 0.61–1.24)
GFR calc Af Amer: >60 mL/min (ref >60)
GFR calc non Af Amer: >60 mL/min (ref >60)
Glucose, Bld: 78 mg/dL (ref 70–99)
Potassium: 3.6 mmol/L (ref 3.5–5.1)
Sodium: 141 mmol/L (ref 135–145)
Total Bilirubin: 1.1 mg/dL (ref 0.3–1.2)
Total Protein: 6.2 g/dL — ABNORMAL LOW (ref 6.5–8.1)

## 2018-10-14 LAB — CBC
HCT: 37.3 % — ABNORMAL LOW (ref 39.0–52.0)
Hemoglobin: 11.1 g/dL — ABNORMAL LOW (ref 13.0–17.0)
MCH: 27.3 pg (ref 26.0–34.0)
MCHC: 29.8 g/dL — ABNORMAL LOW (ref 30.0–36.0)
MCV: 91.6 fL (ref 80.0–100.0)
Platelets: 213 10*3/uL (ref 150–400)
RBC: 4.07 MIL/uL — ABNORMAL LOW (ref 4.22–5.81)
RDW: 15.4 % (ref 11.5–15.5)
WBC: 7.2 10*3/uL (ref 4.0–10.5)
nRBC: 0 % (ref 0.0–0.2)

## 2018-10-14 LAB — HIV ANTIBODY (ROUTINE TESTING W REFLEX): HIV Screen 4th Generation wRfx: NONREACTIVE

## 2018-10-14 MED ORDER — SODIUM CHLORIDE 0.9 % IV BOLUS
500.0000 mL | Freq: Once | INTRAVENOUS | Status: AC
  Administered 2018-10-14: 23:00:00 500 mL via INTRAVENOUS

## 2018-10-14 MED ORDER — SODIUM CHLORIDE 0.9 % IV BOLUS
500.0000 mL | Freq: Once | INTRAVENOUS | Status: AC
  Administered 2018-10-14: 500 mL via INTRAVENOUS

## 2018-10-14 NOTE — Progress Notes (Signed)
Patient still lethargic after receiving bolus of normal saline refer to mar for dose. Patients manual b/p was 85/51. Paged mid level. Awaiting for response. Pt opens eye to voice but falls back asleep. Will continue to monitor throughout shift.

## 2018-10-14 NOTE — Progress Notes (Signed)
Patients blood pressure after receiving normal saline bolus refer to mar for dose was 85/51 manually. Paged mid level. Mid level gave order for another one time bolus of normal saline refer to mar for dose. Mid level also gave order to hold any sedative medication. Will continue to monitor throughout shift.

## 2018-10-14 NOTE — Progress Notes (Signed)
PROGRESS NOTE    [name]  CVE:[id number] DOB: 1950-07-14 DOA: 10/13/2018 PCP: Rosita Fire, MD    Brief Narrative:  68 year old male with a history of mental retardation, schizophrenia, Parkinson's disease and dementia, was brought to the hospital from his family care home with right lower extremity cellulitis.  He was admitted for IV antibiotic therapy.   Assessment & Plan:   Active Problems:   Cellulitis   Schizophrenia ([place])   Parkinson disease ([place])   Dementia ([place])   1. Right lower extremity cellulitis.  Venous Dopplers negative for DVT.  Started on clindamycin for cellulitis order stat.  Continue to elevate right lower extremity.  Overall erythema and warmth have shown mild improvement since yesterday, but it does not appear to be enough of an improvement to transition to oral antibiotics.  Patient still needs inpatient IV antibiotics. 2. Parkinson's disease.  Continue Sinemet 3. Dementia.  Continue Aricept 4. Schizophrenia.  Continue on clozapine. 5. Anxiety.  Continue Klonopin as needed.  He is also on BuSpar.   DVT prophylaxis: Lovenox Code Status: Full code Family Communication: Discussed with brother on 8/19 Disposition Plan: Return to family care home on discharge   Consultants:     Procedures:     Antimicrobials:   Clindamycin 8/19 >   Subjective: Unable to make outpatient speech.  No acute events noted overnight.  Objective: Vitals:   10/13/18 1638 10/13/18 1945 10/13/18 2126 10/14/18 0531  BP: 118/66  99/65 121/74  Pulse: 82  87 73  Resp: (!) 22  20 16   Temp: 98.6 F (37 C)  99.9 F (37.7 C) 98.6 F (37 C)  TempSrc: Oral  Oral Oral  SpO2:  97% 100% 100%  Weight: 69.5 kg     Height:        Intake/Output Summary (Last 24 hours) at 10/14/2018 1945 Last data filed at 10/14/2018 1821 Gross per 24 hour  Intake 820 ml  Output 550 ml  Net 270 ml   Filed Weights   10/13/18 1031 10/13/18 1638  Weight: 81 kg 69.5 kg     Examination:  General exam: Appears calm and comfortable  Respiratory system: Clear to auscultation. Respiratory effort normal. Cardiovascular system: S1 & S2 heard, RRR. No JVD, murmurs, rubs, gallops or clicks.  Gastrointestinal system: Abdomen is nondistended, soft and nontender. No organomegaly or masses felt. Normal bowel sounds heard. Central nervous system: Alert and oriented. No focal neurological deficits. Extremities: Right lower extremity with significant edema as compared to left. Skin: Right lower extremity still erythematous, warm to touch and edematous Psychiatry: Judgement and insight appear normal. Mood & affect appropriate.     Data Reviewed: I have personally reviewed following labs and imaging studies  CBC: Recent Labs  Lab 10/13/18 1118 10/14/18 0524  WBC 8.2 7.2  NEUTROABS 5.0  --   HGB 11.5* 11.1*  HCT 38.5* 37.3*  MCV 91.2 91.6  PLT 219 025   Basic Metabolic Panel: Recent Labs  Lab 10/13/18 1118 10/14/18 0524  NA 141 141  K 3.3* 3.6  CL 97* 101  CO2 35* 32  GLUCOSE 82 78  BUN 18 19  CREATININE 1.29* 1.22  CALCIUM 9.0 8.4*   GFR: Estimated Creatinine Clearance: 52.3 mL/min (by C-G formula based on SCr of 1.22 mg/dL). Liver Function Tests: Recent Labs  Lab 10/13/18 1118 10/14/18 0524  AST 16 17  ALT 5 7  ALKPHOS 56 49  BILITOT 1.2 1.1  PROT 7.0 6.2*  ALBUMIN 3.3* 2.9*  No results for input(s): LIPASE, AMYLASE in the last 168 hours. No results for input(s): AMMONIA in the last 168 hours. Coagulation Profile: No results for input(s): INR, PROTIME in the last 168 hours. Cardiac Enzymes: No results for input(s): CKTOTAL, CKMB, CKMBINDEX, TROPONINI in the last 168 hours. BNP (last 3 results) No results for input(s): PROBNP in the last 8760 hours. HbA1C: No results for input(s): HGBA1C in the last 72 hours. CBG: No results for input(s): GLUCAP in the last 168 hours. Lipid Profile: No results for input(s): CHOL, HDL, LDLCALC,  TRIG, CHOLHDL, LDLDIRECT in the last 72 hours. Thyroid Function Tests: No results for input(s): TSH, T4TOTAL, FREET4, T3FREE, THYROIDAB in the last 72 hours. Anemia Panel: No results for input(s): VITAMINB12, FOLATE, FERRITIN, TIBC, IRON, RETICCTPCT in the last 72 hours. Sepsis Labs: No results for input(s): PROCALCITON, LATICACIDVEN in the last 168 hours.  Recent Results (from the past 240 hour(s))  SARS Coronavirus 2 [facility] order, Performed in [facility] hospital lab) Nasopharyngeal Nasopharyngeal Swab     Status: None   Collection Time: 10/13/18  2:01 PM   Specimen: Nasopharyngeal Swab  Result Value Ref Range Status   SARS Coronavirus 2 NEGATIVE NEGATIVE Final    Comment: (NOTE) If result is NEGATIVE SARS-CoV-2 target nucleic acids are NOT DETECTED. The SARS-CoV-2 RNA is generally detectable in upper and lower  respiratory specimens during the acute phase of infection. The lowest  concentration of SARS-CoV-2 viral copies this assay can detect is 250  copies / mL. A negative result does not preclude SARS-CoV-2 infection  and should not be used as the sole basis for treatment or other  patient management decisions.  A negative result may occur with  improper specimen collection / handling, submission of specimen other  than nasopharyngeal swab, presence of viral mutation(s) within the  areas targeted by this assay, and inadequate number of viral copies  (<250 copies / mL). A negative result must be combined with clinical  observations, patient history, and epidemiological information. If result is POSITIVE SARS-CoV-2 target nucleic acids are DETECTED. The SARS-CoV-2 RNA is generally detectable in upper and lower  respiratory specimens dur ing the acute phase of infection.  Positive  results are indicative of active infection with SARS-CoV-2.  Clinical  correlation with patient history and other diagnostic information is  necessary to determine patient infection status.   Positive results do  not rule out bacterial infection or co-infection with other viruses. If result is PRESUMPTIVE POSTIVE SARS-CoV-2 nucleic acids MAY BE PRESENT.   A presumptive positive result was obtained on the submitted specimen  and confirmed on repeat testing.  While 2019 novel coronavirus  (SARS-CoV-2) nucleic acids may be present in the submitted sample  additional confirmatory testing may be necessary for epidemiological  and / or clinical management purposes  to differentiate between  SARS-CoV-2 and other Sarbecovirus currently known to infect humans.  If clinically indicated additional testing with an alternate test  methodology [phone]) is advised. The SARS-CoV-2 RNA is generally  detectable in upper and lower respiratory sp ecimens during the acute  phase of infection. The expected result is Negative. Fact Sheet for Patients:  StrictlyIdeas.no Fact Sheet for Healthcare Providers: BankingDealers.co.za This test is not yet approved or cleared by the [place] FDA and has been authorized for detection and/or diagnosis of SARS-CoV-2 by FDA under an Emergency Use Authorization (EUA).  This EUA will remain in effect (meaning this test can be used) for the duration of the COVID-19 declaration under  Section 564(b)(1) of the Act, 21 U.S.C. section 360bbb-3(b)(1), unless the authorization is terminated or revoked sooner. Performed at [facility], [address], [place], [place] [address]   MRSA PCR Screening     Status: None   Collection Time: 10/13/18  4:42 PM   Specimen: Nasal Mucosa; Nasopharyngeal  Result Value Ref Range Status   MRSA by PCR NEGATIVE NEGATIVE Final    Comment:        The GeneXpert MRSA Assay (FDA approved for NASAL specimens only), is one component of a comprehensive MRSA colonization surveillance program. It is not intended to diagnose MRSA infection nor to guide or monitor treatment for MRSA  infections. Performed at [facility], [address], [place], [place] [address]          Radiology Studies: US Venous Img Lower Right (dvt Study)  Result Date: 10/13/2018 CLINICAL DATA:  68 year old with pain and swelling in the right lower extremity. EXAM: RIGHT LOWER EXTREMITY VENOUS DOPPLER ULTRASOUND TECHNIQUE: Gray-scale sonography with graded compression, as well as color Doppler and duplex ultrasound were performed to evaluate the lower extremity deep venous systems from the level of the common femoral vein and including the common femoral, femoral, profunda femoral, popliteal and calf veins including the posterior tibial, peroneal and gastrocnemius veins when visible. The superficial great saphenous vein was also interrogated. Spectral Doppler was utilized to evaluate flow at rest and with distal augmentation maneuvers in the common femoral, femoral and popliteal veins. COMPARISON:  09/13/2011 FINDINGS: Contralateral Common Femoral Vein: Respiratory phasicity is normal and symmetric with the symptomatic side. No evidence of thrombus. Normal compressibility. Common Femoral Vein: No evidence of thrombus. Normal compressibility, respiratory phasicity and response to augmentation. Saphenofemoral Junction: No evidence of thrombus. Normal compressibility and flow on color Doppler imaging. Profunda Femoral Vein: No evidence of thrombus. Normal compressibility and flow on color Doppler imaging. Femoral Vein: No evidence of thrombus. Normal compressibility, respiratory phasicity and response to augmentation. Popliteal Vein: No evidence of thrombus. Normal compressibility, respiratory phasicity and response to augmentation. Calf Veins: Visualized right deep calf veins are patent without thrombus. Superficial Great Saphenous Vein: No evidence of thrombus. Normal compressibility. Other Findings:  Subcutaneous edema. IMPRESSION: Negative for deep venous thrombosis in right lower extremity. Electronically  Signed   By: Markus Daft M.D.   On: 10/13/2018 11:53   Dg Chest Port 1 View  Result Date: 10/13/2018 CLINICAL DATA:  Leg swelling EXAM: PORTABLE CHEST 1 VIEW COMPARISON:  09/11/2017 FINDINGS: Cardiac shadows within normal limits. The lungs are well aerated bilaterally. No focal infiltrate or sizable effusion is noted. No bony abnormality is noted. IMPRESSION: No acute abnormality seen. Electronically Signed   By: Inez Catalina M.D.   On: 10/13/2018 11:23        Scheduled Meds:  busPIRone  15 mg Oral BID   carbidopa-levodopa  2 tablet Oral QID   cloZAPine  100 mg Oral QHS   donepezil  10 mg Oral QHS   enoxaparin (LOVENOX) injection  40 mg Subcutaneous Q24H   magnesium oxide  400 mg Oral BID   nicotine  14 mg Transdermal Daily   potassium chloride SA  40 mEq Oral BID   rasagiline  1 mg Oral Daily   tamsulosin  0.4 mg Oral Daily   torsemide  40 mg Oral BID   Continuous Infusions:  clindamycin (CLEOCIN) IV 600 mg (10/14/18 1446)     LOS: 1 day    Time spent: 43mins    Kathie Dike, MD  [facility]   If 7PM-7AM, please contact night-coverage [email or web address]  10/14/2018, 7:45 PM

## 2018-10-14 NOTE — Progress Notes (Signed)
NT called nurse, Beverlee Nims B into room to see patient pacing. Patient had pulled out his IV line and was walking around vocalizing in non intelligable garble ( this is his baseline) also the patient had his fist raised and in what appeared to be a defensive posture. Patient allowed nurse Britta Mccreedy to guide him to a chair and wrap his IV and clean him up. PRN medication given for anxiety. Patient answers in one word answers. Patient was placed in bed, followed orders but was found to be out of bed within a few minutes. Phone call to Ms. [name] @ [phone] to obtain additional information on patient's baseline. A message was left on her VM. Elodia Florence RN 10/14/2018 @2000 

## 2018-10-14 NOTE — Progress Notes (Signed)
Patients blood pressure was 97/58. Paged mid level. Mid level placed one time order for IV bolus of normal saline. Pt is also lethargic responds to voice. Mid level said to reassess patient at 2300 to see if patient needs 2200 medication. Will reassess patient and inform mid level of status at 2300. Will continue to monitor throughout shift.

## 2018-10-15 LAB — GLUCOSE, CAPILLARY: Glucose-Capillary: 116 mg/dL — ABNORMAL HIGH (ref 70–99)

## 2018-10-15 MED ORDER — SODIUM CHLORIDE 0.9 % IV SOLN
INTRAVENOUS | Status: DC | PRN
  Administered 2018-10-15: 14:00:00 500 mL via INTRAVENOUS

## 2018-10-15 NOTE — Progress Notes (Signed)
PROGRESS NOTE    [name]  DQQ:[id number] DOB: 10/16/1950 DOA: 10/13/2018 PCP: Rosita Fire, MD    Brief Narrative:  68 year old male with a history of mental retardation, schizophrenia, Parkinson's disease and dementia, was brought to the hospital from his family care home with right lower extremity cellulitis.  He was admitted for IV antibiotic therapy.   Assessment & Plan:   Active Problems:   Cellulitis   Schizophrenia ([place])   Parkinson disease ([place])   Dementia ([place])   1. Right lower extremity cellulitis.  Venous Dopplers negative for DVT.  Started on clindamycin for cellulitis per order set.  Continue to elevate right lower extremity.  Continues to have significant erythema, edema and warmth in his right lower extremity.  He will need continued IV antibiotics for now.  Once he is clinically improved, can transition to oral antibiotics. 2. Parkinson's disease.  Continue Sinemet 3. Dementia.  Continue Aricept 4. Schizophrenia.  Continue on clozapine. 5. Anxiety.  Continue Klonopin as needed.  He is also on BuSpar.   DVT prophylaxis: Lovenox Code Status: Full code Family Communication: Discussed with brother on 8/19 Disposition Plan: Return to family care home on discharge   Consultants:     Procedures:     Antimicrobials:   Clindamycin 8/19 >   Subjective: Patient denies any shortness of breath.  Denies any pain in his right lower extremity.  Objective: Vitals:   10/14/18 2302 10/15/18 0132 10/15/18 0532 10/15/18 1413  BP: (!) 85/51 (!) 99/55 105/69 117/71  Pulse:   62 84  Resp:   16 16  Temp:   98.9 F (37.2 C) 98.4 F (36.9 C)  TempSrc:   Oral Oral  SpO2:   95% 100%  Weight:      Height:        Intake/Output Summary (Last 24 hours) at 10/15/2018 1947 Last data filed at 10/15/2018 1846 Gross per 24 hour  Intake 767.86 ml  Output 800 ml  Net -32.14 ml   Filed Weights   10/13/18 1031 10/13/18 1638  Weight: 81 kg 69.5 kg     Examination:  General exam: Appears calm and comfortable  Respiratory system: Clear to auscultation. Respiratory effort normal. Cardiovascular system: S1 & S2 heard, RRR. No JVD, murmurs, rubs, gallops or clicks.  Gastrointestinal system: Abdomen is nondistended, soft and nontender. No organomegaly or masses felt. Normal bowel sounds heard. Central nervous system: Alert and oriented. No focal neurological deficits. Extremities: Significant edema in the right lower extremity Skin: Right lower extremity remains warm, erythematous and swollen. Psychiatry: Judgement and insight appear normal. Mood & affect appropriate.     Data Reviewed: I have personally reviewed following labs and imaging studies  CBC: Recent Labs  Lab 10/13/18 1118 10/14/18 0524  WBC 8.2 7.2  NEUTROABS 5.0  --   HGB 11.5* 11.1*  HCT 38.5* 37.3*  MCV 91.2 91.6  PLT 219 194   Basic Metabolic Panel: Recent Labs  Lab 10/13/18 1118 10/14/18 0524  NA 141 141  K 3.3* 3.6  CL 97* 101  CO2 35* 32  GLUCOSE 82 78  BUN 18 19  CREATININE 1.29* 1.22  CALCIUM 9.0 8.4*   GFR: Estimated Creatinine Clearance: 52.3 mL/min (by C-G formula based on SCr of 1.22 mg/dL). Liver Function Tests: Recent Labs  Lab 10/13/18 1118 10/14/18 0524  AST 16 17  ALT 5 7  ALKPHOS 56 49  BILITOT 1.2 1.1  PROT 7.0 6.2*  ALBUMIN 3.3* 2.9*   No results for  input(s): LIPASE, AMYLASE in the last 168 hours. No results for input(s): AMMONIA in the last 168 hours. Coagulation Profile: No results for input(s): INR, PROTIME in the last 168 hours. Cardiac Enzymes: No results for input(s): CKTOTAL, CKMB, CKMBINDEX, TROPONINI in the last 168 hours. BNP (last 3 results) No results for input(s): PROBNP in the last 8760 hours. HbA1C: No results for input(s): HGBA1C in the last 72 hours. CBG: No results for input(s): GLUCAP in the last 168 hours. Lipid Profile: No results for input(s): CHOL, HDL, LDLCALC, TRIG, CHOLHDL, LDLDIRECT in the  last 72 hours. Thyroid Function Tests: No results for input(s): TSH, T4TOTAL, FREET4, T3FREE, THYROIDAB in the last 72 hours. Anemia Panel: No results for input(s): VITAMINB12, FOLATE, FERRITIN, TIBC, IRON, RETICCTPCT in the last 72 hours. Sepsis Labs: No results for input(s): PROCALCITON, LATICACIDVEN in the last 168 hours.  Recent Results (from the past 240 hour(s))  SARS Coronavirus 2 [facility] order, Performed in [facility] hospital lab) Nasopharyngeal Nasopharyngeal Swab     Status: None   Collection Time: 10/13/18  2:01 PM   Specimen: Nasopharyngeal Swab  Result Value Ref Range Status   SARS Coronavirus 2 NEGATIVE NEGATIVE Final    Comment: (NOTE) If result is NEGATIVE SARS-CoV-2 target nucleic acids are NOT DETECTED. The SARS-CoV-2 RNA is generally detectable in upper and lower  respiratory specimens during the acute phase of infection. The lowest  concentration of SARS-CoV-2 viral copies this assay can detect is 250  copies / mL. A negative result does not preclude SARS-CoV-2 infection  and should not be used as the sole basis for treatment or other  patient management decisions.  A negative result may occur with  improper specimen collection / handling, submission of specimen other  than nasopharyngeal swab, presence of viral mutation(s) within the  areas targeted by this assay, and inadequate number of viral copies  (<250 copies / mL). A negative result must be combined with clinical  observations, patient history, and epidemiological information. If result is POSITIVE SARS-CoV-2 target nucleic acids are DETECTED. The SARS-CoV-2 RNA is generally detectable in upper and lower  respiratory specimens dur ing the acute phase of infection.  Positive  results are indicative of active infection with SARS-CoV-2.  Clinical  correlation with patient history and other diagnostic information is  necessary to determine patient infection status.  Positive results do  not rule out  bacterial infection or co-infection with other viruses. If result is PRESUMPTIVE POSTIVE SARS-CoV-2 nucleic acids MAY BE PRESENT.   A presumptive positive result was obtained on the submitted specimen  and confirmed on repeat testing.  While 2019 novel coronavirus  (SARS-CoV-2) nucleic acids may be present in the submitted sample  additional confirmatory testing may be necessary for epidemiological  and / or clinical management purposes  to differentiate between  SARS-CoV-2 and other Sarbecovirus currently known to infect humans.  If clinically indicated additional testing with an alternate test  methodology [phone]) is advised. The SARS-CoV-2 RNA is generally  detectable in upper and lower respiratory sp ecimens during the acute  phase of infection. The expected result is Negative. Fact Sheet for Patients:  StrictlyIdeas.no Fact Sheet for Healthcare Providers: BankingDealers.co.za This test is not yet approved or cleared by the [place] FDA and has been authorized for detection and/or diagnosis of SARS-CoV-2 by FDA under an Emergency Use Authorization (EUA).  This EUA will remain in effect (meaning this test can be used) for the duration of the COVID-19 declaration under Section 564(b)(1) of  the Act, 21 U.S.C. section 360bbb-3(b)(1), unless the authorization is terminated or revoked sooner. Performed at [facility], [address], [place], [place] [address]   MRSA PCR Screening     Status: None   Collection Time: 10/13/18  4:42 PM   Specimen: Nasal Mucosa; Nasopharyngeal  Result Value Ref Range Status   MRSA by PCR NEGATIVE NEGATIVE Final    Comment:        The GeneXpert MRSA Assay (FDA approved for NASAL specimens only), is one component of a comprehensive MRSA colonization surveillance program. It is not intended to diagnose MRSA infection nor to guide or monitor treatment for MRSA infections. Performed at [facility], [address], [place], [place] [address]          Radiology Studies: No results found.      Scheduled Meds: . busPIRone  15 mg Oral BID  . carbidopa-levodopa  2 tablet Oral QID  . cloZAPine  100 mg Oral QHS  . donepezil  10 mg Oral QHS  . enoxaparin (LOVENOX) injection  40 mg Subcutaneous Q24H  . magnesium oxide  400 mg Oral BID  . nicotine  14 mg Transdermal Daily  . potassium chloride SA  40 mEq Oral BID  . rasagiline  1 mg Oral Daily  . tamsulosin  0.4 mg Oral Daily  . torsemide  40 mg Oral BID   Continuous Infusions: . sodium chloride 500 mL (10/15/18 1416)  . clindamycin (CLEOCIN) IV 600 mg (10/15/18 1417)     LOS: 2 days    Time spent: 64mins    Kathie Dike, MD [facility]   If 7PM-7AM, please contact night-coverage [email or web address]  10/15/2018, 7:47 PM

## 2018-10-15 NOTE — Progress Notes (Signed)
Patient second bolus of normal saline refer to mar for dose finished. Patient blood pressure is 99/55. Will let MD aware. Patient woke up to use urinal. Pt back in bed and resting with eye closed breathing is even and nonlabored. Bed alarm on with fall mat in place. Will continue to monitor throughout shift.

## 2018-10-15 NOTE — TOC Progression Note (Signed)
Transition of Care [facility]) - Progression Note    Patient Details  Name: [name] MRN: [id number] Date of Birth: [birth year]  Transition of Care [facility]) CM/SW Contact  Shade Flood, LCSW Phone Number: 10/15/2018, 3:56 PM  Clinical Narrative:     TOC following. Discussed pt's status with MD in Progression this AM. Anticipating weekend dc. Notified [facility] and they will accept. Weekend TOC will assist. They can be reached at [phone]. They will transport at dc.       Expected Discharge Plan and Services                                                 Social Determinants of Health (SDOH) Interventions    Readmission Risk Interventions No flowsheet data found.

## 2018-10-15 NOTE — Care Management Important Message (Signed)
Important Message  Patient Details  Name: ORRIS PERIN MRN: [id number] Date of Birth: 10-13-1950   Medicare Important Message Given:  Yes     Tommy Medal 10/15/2018, 2:17 PM

## 2018-10-16 MED ORDER — DOXYCYCLINE HYCLATE 100 MG PO TABS: 100.0000 mg | ORAL_TABLET | Freq: Two times a day (BID) | ORAL | 0 refills | Status: DC

## 2018-10-16 NOTE — Progress Notes (Signed)
IV removed, report called to [facility] of group home. Patient will be transported via wheelchair to private vehicle.

## 2018-10-16 NOTE — Discharge Summary (Signed)
Physician Discharge Summary  [name] YTK:[id number] DOB: 12-02-1950 DOA: 10/13/2018  PCP: Rosita Fire, MD  Admit date: 10/13/2018 Discharge date: 10/16/2018  Admitted From: Family care home Disposition:  Family care home  Recommendations for Outpatient Follow-up:  1. Follow up with PCP in 1-2 weeks 2. Please obtain BMP/CBC in one week  Discharge Condition:stable CODE STATUS:full code Diet recommendation: regular diet  Brief/Interim Summary: 68 year old male with a history of mental retardation, schizophrenia, Parkinson's disease, dementia, was admitted to the hospital from his family care home with right lower extremity cellulitis.  He was admitted for intravenous antibiotic therapy.  Due to penicillin allergy, he was started on intravenous clindamycin.  Clinically, he has improved.  Overall erythema on the right lower extremity has significantly improved.  Swelling in the right lower extremity persists, but it is not tender or warm.  Venous Dopplers ruled out DVT.  Patient will be transitioned to oral doxycycline.  He has been afebrile.  He will need to continue to elevate his right lower extremity.  The remainder of his medical problems remained stable.  Discharge Diagnoses:  Active Problems:   Cellulitis   Schizophrenia ([place])   Parkinson disease ([place])   Dementia ([place])    Discharge Instructions  Discharge Instructions    Diet - low sodium heart healthy   Complete by: As directed    Increase activity slowly   Complete by: As directed      Allergies as of 10/16/2018      Reactions   Penicillins       Medication List    TAKE these medications   busPIRone 15 MG tablet Commonly known as: BUSPAR Take 15 mg by mouth 2 (two) times daily.   carbidopa-levodopa 25-100 MG tablet Commonly known as: SINEMET IR Take 2 tablets by mouth 4 (four) times daily.   clonazePAM 0.5 MG tablet Commonly known as: KLONOPIN Take 0.25 mg by mouth daily as needed for  anxiety.   cloZAPine 100 MG tablet Commonly known as: CLOZARIL Take 100 mg by mouth at bedtime.   donepezil 10 MG tablet Commonly known as: ARICEPT Take 10 mg by mouth daily.   doxycycline 100 MG tablet Commonly known as: VIBRA-TABS Take 1 tablet (100 mg total) by mouth 2 (two) times daily.   magnesium oxide 400 MG tablet Commonly known as: MAG-OX Take 400 mg by mouth 2 (two) times daily.   potassium chloride SA 20 MEQ tablet Commonly known as: K-DUR Take 40 mEq by mouth 2 (two) times daily.   Q-PAP 500 MG tablet Generic drug: acetaminophen Take 500 mg by mouth every 6 (six) hours as needed. For pain   rasagiline 0.5 MG Tabs tablet Commonly known as: AZILECT Take 1 mg by mouth daily.   silver sulfADIAZINE 1 % cream Commonly known as: SILVADENE Apply 1 application topically daily.   tamsulosin 0.4 MG Caps capsule Commonly known as: FLOMAX Take 0.4 mg by mouth daily.   torsemide 20 MG tablet Commonly known as: DEMADEX Take 40 mg by mouth 2 (two) times daily.       Allergies  Allergen Reactions  . Penicillins     Consultations:     Procedures/Studies: US Venous Img Lower Right (dvt Study)  Result Date: 10/13/2018 CLINICAL DATA:  68 year old with pain and swelling in the right lower extremity. EXAM: RIGHT LOWER EXTREMITY VENOUS DOPPLER ULTRASOUND TECHNIQUE: Gray-scale sonography with graded compression, as well as color Doppler and duplex ultrasound were performed to evaluate the lower extremity deep venous systems  from the level of the common femoral vein and including the common femoral, femoral, profunda femoral, popliteal and calf veins including the posterior tibial, peroneal and gastrocnemius veins when visible. The superficial great saphenous vein was also interrogated. Spectral Doppler was utilized to evaluate flow at rest and with distal augmentation maneuvers in the common femoral, femoral and popliteal veins. COMPARISON:  09/13/2011 FINDINGS:  Contralateral Common Femoral Vein: Respiratory phasicity is normal and symmetric with the symptomatic side. No evidence of thrombus. Normal compressibility. Common Femoral Vein: No evidence of thrombus. Normal compressibility, respiratory phasicity and response to augmentation. Saphenofemoral Junction: No evidence of thrombus. Normal compressibility and flow on color Doppler imaging. Profunda Femoral Vein: No evidence of thrombus. Normal compressibility and flow on color Doppler imaging. Femoral Vein: No evidence of thrombus. Normal compressibility, respiratory phasicity and response to augmentation. Popliteal Vein: No evidence of thrombus. Normal compressibility, respiratory phasicity and response to augmentation. Calf Veins: Visualized right deep calf veins are patent without thrombus. Superficial Great Saphenous Vein: No evidence of thrombus. Normal compressibility. Other Findings:  Subcutaneous edema. IMPRESSION: Negative for deep venous thrombosis in right lower extremity. Electronically Signed   By: Markus Daft M.D.   On: 10/13/2018 11:53   Dg Chest Port 1 View  Result Date: 10/13/2018 CLINICAL DATA:  Leg swelling EXAM: PORTABLE CHEST 1 VIEW COMPARISON:  09/11/2017 FINDINGS: Cardiac shadows within normal limits. The lungs are well aerated bilaterally. No focal infiltrate or sizable effusion is noted. No bony abnormality is noted. IMPRESSION: No acute abnormality seen. Electronically Signed   By: Inez Catalina M.D.   On: 10/13/2018 11:23       Subjective: Denies any pain. No events overnight  Discharge Exam: Vitals:   10/15/18 1413 10/15/18 2121 10/15/18 2337 10/16/18 0540  BP: 117/71 (!) 89/49 (!) 88/54 123/64  Pulse: 84 61 60 62  Resp: 16 16  16   Temp: 98.4 F (36.9 C) 98.3 F (36.8 C)  97.9 F (36.6 C)  TempSrc: Oral Oral  Oral  SpO2: 100% 100%  100%  Weight:      Height:        General: Pt is alert, awake, not in acute distress Cardiovascular: RRR, S1/S2 +, no rubs, no  gallops Respiratory: CTA bilaterally, no wheezing, no rhonchi Abdominal: Soft, NT, ND, bowel sounds + Extremities: RLE erythema has improved. Edema persists    The results of significant diagnostics from this hospitalization (including imaging, microbiology, ancillary and laboratory) are listed below for reference.     Microbiology: Recent Results (from the past 240 hour(s))  SARS Coronavirus 2 [facility] order, Performed in [facility] hospital lab) Nasopharyngeal Nasopharyngeal Swab     Status: None   Collection Time: 10/13/18  2:01 PM   Specimen: Nasopharyngeal Swab  Result Value Ref Range Status   SARS Coronavirus 2 NEGATIVE NEGATIVE Final    Comment: (NOTE) If result is NEGATIVE SARS-CoV-2 target nucleic acids are NOT DETECTED. The SARS-CoV-2 RNA is generally detectable in upper and lower  respiratory specimens during the acute phase of infection. The lowest  concentration of SARS-CoV-2 viral copies this assay can detect is 250  copies / mL. A negative result does not preclude SARS-CoV-2 infection  and should not be used as the sole basis for treatment or other  patient management decisions.  A negative result may occur with  improper specimen collection / handling, submission of specimen other  than nasopharyngeal swab, presence of viral mutation(s) within the  areas targeted by this assay, and inadequate number  of viral copies  (<250 copies / mL). A negative result must be combined with clinical  observations, patient history, and epidemiological information. If result is POSITIVE SARS-CoV-2 target nucleic acids are DETECTED. The SARS-CoV-2 RNA is generally detectable in upper and lower  respiratory specimens dur ing the acute phase of infection.  Positive  results are indicative of active infection with SARS-CoV-2.  Clinical  correlation with patient history and other diagnostic information is  necessary to determine patient infection status.  Positive results do  not  rule out bacterial infection or co-infection with other viruses. If result is PRESUMPTIVE POSTIVE SARS-CoV-2 nucleic acids MAY BE PRESENT.   A presumptive positive result was obtained on the submitted specimen  and confirmed on repeat testing.  While 2019 novel coronavirus  (SARS-CoV-2) nucleic acids may be present in the submitted sample  additional confirmatory testing may be necessary for epidemiological  and / or clinical management purposes  to differentiate between  SARS-CoV-2 and other Sarbecovirus currently known to infect humans.  If clinically indicated additional testing with an alternate test  methodology [phone]) is advised. The SARS-CoV-2 RNA is generally  detectable in upper and lower respiratory sp ecimens during the acute  phase of infection. The expected result is Negative. Fact Sheet for Patients:  StrictlyIdeas.no Fact Sheet for Healthcare Providers: BankingDealers.co.za This test is not yet approved or cleared by the [place] FDA and has been authorized for detection and/or diagnosis of SARS-CoV-2 by FDA under an Emergency Use Authorization (EUA).  This EUA will remain in effect (meaning this test can be used) for the duration of the COVID-19 declaration under Section 564(b)(1) of the Act, 21 U.S.C. section 360bbb-3(b)(1), unless the authorization is terminated or revoked sooner. Performed at [facility], [address] [place], [place] [address]   MRSA PCR Screening     Status: None   Collection Time: 10/13/18  4:42 PM   Specimen: Nasal Mucosa; Nasopharyngeal  Result Value Ref Range Status   MRSA by PCR NEGATIVE NEGATIVE Final    Comment:        The GeneXpert MRSA Assay (FDA approved for NASAL specimens only), is one component of a comprehensive MRSA colonization surveillance program. It is not intended to diagnose MRSA infection nor to guide or monitor treatment for MRSA infections. Performed at  [facility], [address], [place], [place] [address]      Labs: BNP (last 3 results) Recent Labs    10/13/18 1118  BNP 44.9   Basic Metabolic Panel: Recent Labs  Lab 10/13/18 1118 10/14/18 0524  NA 141 141  K 3.3* 3.6  CL 97* 101  CO2 35* 32  GLUCOSE 82 78  BUN 18 19  CREATININE 1.29* 1.22  CALCIUM 9.0 8.4*   Liver Function Tests: Recent Labs  Lab 10/13/18 1118 10/14/18 0524  AST 16 17  ALT 5 7  ALKPHOS 56 49  BILITOT 1.2 1.1  PROT 7.0 6.2*  ALBUMIN 3.3* 2.9*   No results for input(s): LIPASE, AMYLASE in the last 168 hours. No results for input(s): AMMONIA in the last 168 hours. CBC: Recent Labs  Lab 10/13/18 1118 10/14/18 0524  WBC 8.2 7.2  NEUTROABS 5.0  --   HGB 11.5* 11.1*  HCT 38.5* 37.3*  MCV 91.2 91.6  PLT 219 213   Cardiac Enzymes: No results for input(s): CKTOTAL, CKMB, CKMBINDEX, TROPONINI in the last 168 hours. BNP: Invalid input(s): POCBNP CBG: Recent Labs  Lab 10/15/18 2118  GLUCAP 116*   D-Dimer  No results for input(s): DDIMER in the last 72 hours. Hgb A1c No results for input(s): HGBA1C in the last 72 hours. Lipid Profile No results for input(s): CHOL, HDL, LDLCALC, TRIG, CHOLHDL, LDLDIRECT in the last 72 hours. Thyroid function studies No results for input(s): TSH, T4TOTAL, T3FREE, THYROIDAB in the last 72 hours.  Invalid input(s): FREET3 Anemia work up No results for input(s): VITAMINB12, FOLATE, FERRITIN, TIBC, IRON, RETICCTPCT in the last 72 hours. Urinalysis    Component Value Date/Time   COLORURINE YELLOW 01/08/2016 1122   APPEARANCEUR CLEAR 01/08/2016 1122   LABSPEC <1.005 (L) 01/08/2016 1122   PHURINE 5.5 01/08/2016 1122   GLUCOSEU NEGATIVE 01/08/2016 1122   HGBUR NEGATIVE 01/08/2016 1122   BILIRUBINUR NEGATIVE 01/08/2016 1122   KETONESUR NEGATIVE 01/08/2016 1122   PROTEINUR NEGATIVE 01/08/2016 1122   UROBILINOGEN 0.2 09/12/2011 1650   NITRITE NEGATIVE 01/08/2016 1122   LEUKOCYTESUR NEGATIVE 01/08/2016  1122   Sepsis Labs Invalid input(s): PROCALCITONIN,  WBC,  LACTICIDVEN Microbiology Recent Results (from the past 240 hour(s))  SARS Coronavirus 2 [facility] order, Performed in [facility] hospital lab) Nasopharyngeal Nasopharyngeal Swab     Status: None   Collection Time: 10/13/18  2:01 PM   Specimen: Nasopharyngeal Swab  Result Value Ref Range Status   SARS Coronavirus 2 NEGATIVE NEGATIVE Final    Comment: (NOTE) If result is NEGATIVE SARS-CoV-2 target nucleic acids are NOT DETECTED. The SARS-CoV-2 RNA is generally detectable in upper and lower  respiratory specimens during the acute phase of infection. The lowest  concentration of SARS-CoV-2 viral copies this assay can detect is 250  copies / mL. A negative result does not preclude SARS-CoV-2 infection  and should not be used as the sole basis for treatment or other  patient management decisions.  A negative result may occur with  improper specimen collection / handling, submission of specimen other  than nasopharyngeal swab, presence of viral mutation(s) within the  areas targeted by this assay, and inadequate number of viral copies  (<250 copies / mL). A negative result must be combined with clinical  observations, patient history, and epidemiological information. If result is POSITIVE SARS-CoV-2 target nucleic acids are DETECTED. The SARS-CoV-2 RNA is generally detectable in upper and lower  respiratory specimens dur ing the acute phase of infection.  Positive  results are indicative of active infection with SARS-CoV-2.  Clinical  correlation with patient history and other diagnostic information is  necessary to determine patient infection status.  Positive results do  not rule out bacterial infection or co-infection with other viruses. If result is PRESUMPTIVE POSTIVE SARS-CoV-2 nucleic acids MAY BE PRESENT.   A presumptive positive result was obtained on the submitted specimen  and confirmed on repeat testing.  While 2019  novel coronavirus  (SARS-CoV-2) nucleic acids may be present in the submitted sample  additional confirmatory testing may be necessary for epidemiological  and / or clinical management purposes  to differentiate between  SARS-CoV-2 and other Sarbecovirus currently known to infect humans.  If clinically indicated additional testing with an alternate test  methodology [phone]) is advised. The SARS-CoV-2 RNA is generally  detectable in upper and lower respiratory sp ecimens during the acute  phase of infection. The expected result is Negative. Fact Sheet for Patients:  StrictlyIdeas.no Fact Sheet for Healthcare Providers: BankingDealers.co.za This test is not yet approved or cleared by the [place] FDA and has been authorized for detection and/or diagnosis of SARS-CoV-2 by FDA under an Emergency Use Authorization (EUA).  This  EUA will remain in effect (meaning this test can be used) for the duration of the COVID-19 declaration under Section 564(b)(1) of the Act, 21 U.S.C. section 360bbb-3(b)(1), unless the authorization is terminated or revoked sooner. Performed at [facility], [address] [place], [place] [address]   MRSA PCR Screening     Status: None   Collection Time: 10/13/18  4:42 PM   Specimen: Nasal Mucosa; Nasopharyngeal  Result Value Ref Range Status   MRSA by PCR NEGATIVE NEGATIVE Final    Comment:        The GeneXpert MRSA Assay (FDA approved for NASAL specimens only), is one component of a comprehensive MRSA colonization surveillance program. It is not intended to diagnose MRSA infection nor to guide or monitor treatment for MRSA infections. Performed at [facility], [address], [place], [place] [address]      Time coordinating discharge: 65mins  SIGNED:   Kathie Dike, MD  [facility] 10/16/2018, 11:03 AM   If 7PM-7AM, please contact night-coverage [email or web address]

## 2018-10-26 DIAGNOSIS — L03115 Cellulitis of right lower limb: Secondary | ICD-10-CM | POA: Diagnosis not present

## 2018-10-26 DIAGNOSIS — F0390 Unspecified dementia without behavioral disturbance: Secondary | ICD-10-CM | POA: Diagnosis not present

## 2018-10-26 DIAGNOSIS — Z79899 Other long term (current) drug therapy: Secondary | ICD-10-CM | POA: Diagnosis not present

## 2018-10-26 DIAGNOSIS — R609 Edema, unspecified: Secondary | ICD-10-CM | POA: Diagnosis not present

## 2018-10-26 DIAGNOSIS — Z23 Encounter for immunization: Secondary | ICD-10-CM | POA: Diagnosis not present

## 2018-10-26 DIAGNOSIS — I10 Essential (primary) hypertension: Secondary | ICD-10-CM | POA: Diagnosis not present

## 2018-10-26 DIAGNOSIS — G20 Parkinson's disease: Secondary | ICD-10-CM | POA: Diagnosis not present

## 2018-11-22 ENCOUNTER — Encounter (HOSPITAL_COMMUNITY): Payer: Self-pay | Admitting: Emergency Medicine

## 2018-11-22 ENCOUNTER — Other Ambulatory Visit: Payer: Self-pay

## 2018-11-22 ENCOUNTER — Emergency Department (HOSPITAL_COMMUNITY)
Admission: EM | Admit: 2018-11-22 | Discharge: 2018-11-22 | Disposition: A | Discharge status: Home / Self Care | Payer: Medicare Other | Attending: Emergency Medicine | Admitting: Emergency Medicine

## 2018-11-22 ENCOUNTER — Emergency Department (HOSPITAL_COMMUNITY): Payer: Medicare Other

## 2018-11-22 DIAGNOSIS — F17210 Nicotine dependence, cigarettes, uncomplicated: Secondary | ICD-10-CM | POA: Insufficient documentation

## 2018-11-22 DIAGNOSIS — I10 Essential (primary) hypertension: Secondary | ICD-10-CM | POA: Diagnosis not present

## 2018-11-22 DIAGNOSIS — I824Z1 Acute embolism and thrombosis of unspecified deep veins of right distal lower extremity: Secondary | ICD-10-CM | POA: Insufficient documentation

## 2018-11-22 DIAGNOSIS — Z79899 Other long term (current) drug therapy: Secondary | ICD-10-CM | POA: Diagnosis not present

## 2018-11-22 DIAGNOSIS — M79604 Pain in right leg: Secondary | ICD-10-CM

## 2018-11-22 DIAGNOSIS — I82441 Acute embolism and thrombosis of right tibial vein: Secondary | ICD-10-CM | POA: Diagnosis not present

## 2018-11-22 DIAGNOSIS — R2241 Localized swelling, mass and lump, right lower limb: Secondary | ICD-10-CM | POA: Diagnosis present

## 2018-11-22 LAB — APTT: aPTT: 30 seconds (ref 24–36)

## 2018-11-22 LAB — COMPREHENSIVE METABOLIC PANEL
ALT: 13 U/L (ref 0–44)
AST: 24 U/L (ref 15–41)
Albumin: 3.9 g/dL (ref 3.5–5.0)
Alkaline Phosphatase: 59 U/L (ref 38–126)
Anion gap: 9 (ref 5–15)
BUN: 26 mg/dL — ABNORMAL HIGH (ref 8–23)
CO2: 32 mmol/L (ref 22–32)
Calcium: 8.9 mg/dL (ref 8.9–10.3)
Chloride: 99 mmol/L (ref 98–111)
Creatinine, Ser: 1.72 mg/dL — ABNORMAL HIGH (ref 0.61–1.24)
GFR calc Af Amer: 46 mL/min — ABNORMAL LOW (ref >60)
GFR calc non Af Amer: 40 mL/min — ABNORMAL LOW (ref >60)
Glucose, Bld: 161 mg/dL — ABNORMAL HIGH (ref 70–99)
Potassium: 3.6 mmol/L (ref 3.5–5.1)
Sodium: 140 mmol/L (ref 135–145)
Total Bilirubin: 0.9 mg/dL (ref 0.3–1.2)
Total Protein: 7.5 g/dL (ref 6.5–8.1)

## 2018-11-22 LAB — CBC WITH DIFFERENTIAL/PLATELET
Abs Immature Granulocytes: 0.02 10*3/uL (ref 0.00–0.07)
Basophils Absolute: 0 10*3/uL (ref 0.0–0.1)
Basophils Relative: 0 %
Eosinophils Absolute: 0 10*3/uL (ref 0.0–0.5)
Eosinophils Relative: 0 %
HCT: 41.5 % (ref 39.0–52.0)
Hemoglobin: 12.1 g/dL — ABNORMAL LOW (ref 13.0–17.0)
Immature Granulocytes: 0 %
Lymphocytes Relative: 31 %
Lymphs Abs: 2.3 10*3/uL (ref 0.7–4.0)
MCH: 27 pg (ref 26.0–34.0)
MCHC: 29.2 g/dL — ABNORMAL LOW (ref 30.0–36.0)
MCV: 92.6 fL (ref 80.0–100.0)
Monocytes Absolute: 0.6 10*3/uL (ref 0.1–1.0)
Monocytes Relative: 8 %
Neutro Abs: 4.5 10*3/uL (ref 1.7–7.7)
Neutrophils Relative %: 61 %
Platelets: 254 10*3/uL (ref 150–400)
RBC: 4.48 MIL/uL (ref 4.22–5.81)
RDW: 15.5 % (ref 11.5–15.5)
WBC: 7.4 10*3/uL (ref 4.0–10.5)
nRBC: 0 % (ref 0.0–0.2)

## 2018-11-22 LAB — MAGNESIUM: Magnesium: 2.6 mg/dL — ABNORMAL HIGH (ref 1.7–2.4)

## 2018-11-22 LAB — PROTIME-INR
INR: 1 (ref 0.8–1.2)
Prothrombin Time: 13.2 seconds (ref 11.4–15.2)

## 2018-11-22 LAB — BRAIN NATRIURETIC PEPTIDE: B Natriuretic Peptide: 14 pg/mL (ref 0.0–100.0)

## 2018-11-22 MED ORDER — RIVAROXABAN 15 MG PO TABS
15.0000 mg | ORAL_TABLET | Freq: Once | ORAL | Status: AC
  Administered 2018-11-22: 15 mg via ORAL
  Filled 2018-11-22: qty 1

## 2018-11-22 MED ORDER — RIVAROXABAN (XARELTO) EDUCATION KIT FOR DVT/PE PATIENTS: 1.0000 | PACK | Freq: Once | 0 refills | Status: AC

## 2018-11-22 MED ORDER — RIVAROXABAN (XARELTO) VTE STARTER PACK (15 & 20 MG): ORAL_TABLET | ORAL | 0 refills | Status: DC

## 2018-11-22 NOTE — ED Triage Notes (Signed)
Pt caregiver states that his legs have been swollen today. They denies injury to his legs

## 2018-11-22 NOTE — ED Provider Notes (Signed)
[facility] Provider Note   CSN: [id number] Arrival date & time: 11/22/18  1341     History   Chief Complaint Chief Complaint  Patient presents with   Leg Swelling    HPI [name] Auth is a 68 y.o. male.     The history is provided by the nursing home. The history is limited by a developmental delay. No language interpreter was used.  Staff member from nursing home reports pt has swelling to his right leg.  Pt is mostly nonverbal but could verbalize pain.  She reports leg does not seem to cause pain   Past Medical History:  Diagnosis Date   Cellulitis    Dementia ([name])    Edema    Hypertension    Hypoglycemia    Mental retardation    Parkinson's disease ([place])    Psychosis ([place])    Renal insufficiency     Patient Active Problem List   Diagnosis Date Noted   Cellulitis 10/13/2018   Schizophrenia ([place]) 10/13/2018   Parkinson disease ([place]) 10/13/2018   Dementia ([name]) 10/13/2018   Encounter for screening colonoscopy 01/30/2015    Past Surgical History:  Procedure Laterality Date   COLONOSCOPY N/A 03/12/2015   Procedure: COLONOSCOPY;  Surgeon: Danie Binder, MD;  Location: [facility];  Service: Endoscopy;  Laterality: N/A;  1430 - moved to 1/16 @ 1:45 - office to notify        Home Medications    Prior to Admission medications   Medication Sig Start Date End Date Taking? Authorizing Provider  acetaminophen (Q-PAP) 500 MG tablet Take 500 mg by mouth every 6 (six) hours as needed. For pain    [provider]  busPIRone (BUSPAR) 15 MG tablet Take 15 mg by mouth 2 (two) times daily.    [provider]  carbidopa-levodopa (SINEMET IR) 25-100 MG per tablet Take 2 tablets by mouth 4 (four) times daily.    [provider]  clonazePAM (KLONOPIN) 0.5 MG tablet Take 0.25 mg by mouth daily as needed for anxiety.    [provider]  cloZAPine (CLOZARIL) 100 MG tablet Take 100 mg by mouth at  bedtime.    [provider]  donepezil (ARICEPT) 10 MG tablet Take 10 mg by mouth daily.    [provider]  doxycycline (VIBRA-TABS) 100 MG tablet Take 1 tablet (100 mg total) by mouth 2 (two) times daily. 10/16/18   Kathie Dike, MD  magnesium oxide (MAG-OX) 400 MG tablet Take 400 mg by mouth 2 (two) times daily.    [provider]  potassium chloride SA (K-DUR,KLOR-CON) 20 MEQ tablet Take 40 mEq by mouth 2 (two) times daily.    [provider]  rasagiline (AZILECT) 0.5 MG TABS tablet Take 1 mg by mouth daily.    [provider]  silver sulfADIAZINE (SILVADENE) 1 % cream Apply 1 application topically daily.    [provider]  tamsulosin (FLOMAX) 0.4 MG CAPS capsule Take 0.4 mg by mouth daily.    [provider]  torsemide (DEMADEX) 20 MG tablet Take 40 mg by mouth 2 (two) times daily.    [provider]    Family History Family History  Problem Relation Age of Onset   Colon cancer Other        Unknown family history    Social History Social History   Tobacco Use   Smoking status: Current Every Day Smoker   Smokeless tobacco: Never Used  Substance  Use Topics   Alcohol use: No    Alcohol/week: 0.0 standard drinks   Drug use: No     Allergies   Penicillins   Review of Systems Review of Systems  Musculoskeletal: Positive for arthralgias.  All other systems reviewed and are negative.    Physical Exam Updated Vital Signs BP 104/63 (BP Location: Right Arm)    Pulse 68    Temp 98 F (36.7 C) (Oral)    Resp 16    Ht '5\' 6"'  (1.676 m)    Wt 85.7 kg    SpO2 100%    BMI 30.51 kg/m   Physical Exam Vitals signs and nursing note reviewed.  Constitutional:      Appearance: He is well-developed.  HENT:     Head: Normocephalic.  Neck:     Musculoskeletal: Normal range of motion.  Pulmonary:     Effort: Pulmonary effort is normal.  Abdominal:     General: There is no distension.  Musculoskeletal:         General: Swelling present.     Comments: Swollen right leg,  Skin dark and cracked   Neurological:     Mental Status: He is alert and oriented to person, place, and time.  Psychiatric:        Behavior: Behavior normal.      ED Treatments / Results  Labs (all labs ordered are listed, but only abnormal results are displayed) Labs Reviewed  MAGNESIUM - Abnormal; Notable for the following components:      Result Value   Magnesium 2.6 (*)    All other components within normal limits  COMPREHENSIVE METABOLIC PANEL - Abnormal; Notable for the following components:   Glucose, Bld 161 (*)    BUN 26 (*)    Creatinine, Ser 1.72 (*)    GFR calc non Af Amer 40 (*)    GFR calc Af Amer 46 (*)    All other components within normal limits  CBC WITH DIFFERENTIAL/PLATELET - Abnormal; Notable for the following components:   Hemoglobin 12.1 (*)    MCHC 29.2 (*)    All other components within normal limits  BRAIN NATRIURETIC PEPTIDE  APTT  PROTIME-INR    EKG None  Radiology No results found.  Procedures Procedures (including critical care time)  Medications Ordered in ED Medications - No data to display   Initial Impression / Assessment and Plan / ED Course  I have reviewed the triage vital signs and the nursing notes.  Pertinent labs & imaging results that were available during my care of the patient were reviewed by me and considered in my medical decision making (see chart for details).        MDM  Ultrasound shows dvt.  Pt started on xarelto.    Final Clinical Impressions(s) / ED Diagnoses   Final diagnoses:  DVT, lower extremity, distal, acute, right [facility])    ED Discharge Orders         Ordered    rivaroxaban (XARELTO) KIT   Once     11/22/18 1650    Rivaroxaban 15 & 20 MG TBPK     11/22/18 1650        An After Visit Summary was printed and given to the patient.    [name], [place] 11/23/18 9983    Jeremiah Ferguson, MD 11/24/18 1022

## 2018-11-22 NOTE — Discharge Instructions (Signed)
See your Primary care MD for recheck in 3-4 days.

## 2018-11-23 ENCOUNTER — Emergency Department (HOSPITAL_COMMUNITY): Payer: Medicare Other

## 2018-11-23 ENCOUNTER — Encounter (HOSPITAL_COMMUNITY): Payer: Self-pay | Admitting: *Deleted

## 2018-11-23 ENCOUNTER — Emergency Department (HOSPITAL_COMMUNITY)
Admission: EM | Admit: 2018-11-23 | Discharge: 2018-11-23 | Disposition: A | Discharge status: Home / Self Care | Payer: Medicare Other | Attending: Emergency Medicine | Admitting: Emergency Medicine

## 2018-11-23 ENCOUNTER — Other Ambulatory Visit: Payer: Self-pay

## 2018-11-23 DIAGNOSIS — I10 Essential (primary) hypertension: Secondary | ICD-10-CM | POA: Diagnosis not present

## 2018-11-23 DIAGNOSIS — R609 Edema, unspecified: Secondary | ICD-10-CM | POA: Diagnosis not present

## 2018-11-23 DIAGNOSIS — R4781 Slurred speech: Secondary | ICD-10-CM | POA: Diagnosis not present

## 2018-11-23 DIAGNOSIS — Z79899 Other long term (current) drug therapy: Secondary | ICD-10-CM | POA: Insufficient documentation

## 2018-11-23 DIAGNOSIS — W19XXXA Unspecified fall, initial encounter: Secondary | ICD-10-CM | POA: Diagnosis not present

## 2018-11-23 DIAGNOSIS — M7989 Other specified soft tissue disorders: Secondary | ICD-10-CM | POA: Diagnosis not present

## 2018-11-23 DIAGNOSIS — F17200 Nicotine dependence, unspecified, uncomplicated: Secondary | ICD-10-CM | POA: Diagnosis not present

## 2018-11-23 DIAGNOSIS — F0390 Unspecified dementia without behavioral disturbance: Secondary | ICD-10-CM | POA: Diagnosis not present

## 2018-11-23 DIAGNOSIS — R2241 Localized swelling, mass and lump, right lower limb: Secondary | ICD-10-CM | POA: Insufficient documentation

## 2018-11-23 DIAGNOSIS — G20 Parkinson's disease: Secondary | ICD-10-CM | POA: Insufficient documentation

## 2018-11-23 DIAGNOSIS — J9811 Atelectasis: Secondary | ICD-10-CM | POA: Diagnosis not present

## 2018-11-23 DIAGNOSIS — R4182 Altered mental status, unspecified: Secondary | ICD-10-CM | POA: Diagnosis not present

## 2018-11-23 DIAGNOSIS — R531 Weakness: Secondary | ICD-10-CM | POA: Diagnosis not present

## 2018-11-23 DIAGNOSIS — R404 Transient alteration of awareness: Secondary | ICD-10-CM | POA: Diagnosis not present

## 2018-11-23 DIAGNOSIS — R262 Difficulty in walking, not elsewhere classified: Secondary | ICD-10-CM | POA: Diagnosis not present

## 2018-11-23 LAB — CBC WITH DIFFERENTIAL/PLATELET
Abs Immature Granulocytes: 0.02 10*3/uL (ref 0.00–0.07)
Basophils Absolute: 0 10*3/uL (ref 0.0–0.1)
Basophils Relative: 0 %
Eosinophils Absolute: 0 10*3/uL (ref 0.0–0.5)
Eosinophils Relative: 0 %
HCT: 41.1 % (ref 39.0–52.0)
Hemoglobin: 12.2 g/dL — ABNORMAL LOW (ref 13.0–17.0)
Immature Granulocytes: 0 %
Lymphocytes Relative: 23 %
Lymphs Abs: 2.3 10*3/uL (ref 0.7–4.0)
MCH: 27.4 pg (ref 26.0–34.0)
MCHC: 29.7 g/dL — ABNORMAL LOW (ref 30.0–36.0)
MCV: 92.2 fL (ref 80.0–100.0)
Monocytes Absolute: 0.6 10*3/uL (ref 0.1–1.0)
Monocytes Relative: 6 %
Neutro Abs: 6.9 10*3/uL (ref 1.7–7.7)
Neutrophils Relative %: 71 %
Platelets: 222 10*3/uL (ref 150–400)
RBC: 4.46 MIL/uL (ref 4.22–5.81)
RDW: 15.4 % (ref 11.5–15.5)
WBC: 9.9 10*3/uL (ref 4.0–10.5)
nRBC: 0 % (ref 0.0–0.2)

## 2018-11-23 LAB — COMPREHENSIVE METABOLIC PANEL
ALT: 13 U/L (ref 0–44)
AST: 19 U/L (ref 15–41)
Albumin: 3.7 g/dL (ref 3.5–5.0)
Alkaline Phosphatase: 56 U/L (ref 38–126)
Anion gap: 8 (ref 5–15)
BUN: 25 mg/dL — ABNORMAL HIGH (ref 8–23)
CO2: 31 mmol/L (ref 22–32)
Calcium: 8.7 mg/dL — ABNORMAL LOW (ref 8.9–10.3)
Chloride: 100 mmol/L (ref 98–111)
Creatinine, Ser: 1.5 mg/dL — ABNORMAL HIGH (ref 0.61–1.24)
GFR calc Af Amer: 55 mL/min — ABNORMAL LOW (ref >60)
GFR calc non Af Amer: 47 mL/min — ABNORMAL LOW (ref >60)
Glucose, Bld: 125 mg/dL — ABNORMAL HIGH (ref 70–99)
Potassium: 3.2 mmol/L — ABNORMAL LOW (ref 3.5–5.1)
Sodium: 139 mmol/L (ref 135–145)
Total Bilirubin: 1 mg/dL (ref 0.3–1.2)
Total Protein: 7.2 g/dL (ref 6.5–8.1)

## 2018-11-23 LAB — URINALYSIS, ROUTINE W REFLEX MICROSCOPIC
Bilirubin Urine: NEGATIVE
Glucose, UA: NEGATIVE mg/dL
Hgb urine dipstick: NEGATIVE
Ketones, ur: NEGATIVE mg/dL
Leukocytes,Ua: NEGATIVE
Nitrite: NEGATIVE
Protein, ur: NEGATIVE mg/dL
Specific Gravity, Urine: 1.009 (ref 1.005–1.030)
pH: 6 (ref 5.0–8.0)

## 2018-11-23 LAB — LACTIC ACID, PLASMA: Lactic Acid, Venous: 0.7 mmol/L (ref 0.5–1.9)

## 2018-11-23 LAB — TROPONIN I (HIGH SENSITIVITY): Troponin I (High Sensitivity): 3 ng/L (ref <18)

## 2018-11-23 MED ORDER — SODIUM CHLORIDE 0.9 % IV BOLUS
1000.0000 mL | Freq: Once | INTRAVENOUS | Status: AC
  Administered 2018-11-23: 1000 mL via INTRAVENOUS

## 2018-11-23 MED ORDER — SODIUM CHLORIDE 0.9 % IV BOLUS: 500.0000 mL | Freq: Once | INTRAVENOUS | Status: DC

## 2018-11-23 MED ORDER — RIVAROXABAN 15 MG PO TABS
15.0000 mg | ORAL_TABLET | Freq: Once | ORAL | Status: AC
  Administered 2018-11-23: 16:00:00 15 mg via ORAL
  Filled 2018-11-23: qty 1

## 2018-11-23 NOTE — Discharge Instructions (Addendum)
Mr. [name] was seen in the ER for difficulty walking.  We suspect this is from the discomfort in his right leg from the blood clot he has.  Please continue Xarelto as prescribed.  He was given a coupon card here to help with the cost.  Follow-up with primary care doctor soon as possible for further discussion of Xarelto prescription and future refills.  Work-up in the ER did not reveal any infectious source.  He was a little dehydrated but he was given fluids through his IV.  He ambulated independently with a walker without any difficulty.  He ate and drank here without issues.

## 2018-11-23 NOTE — ED Notes (Signed)
D/c 2nd Lactic Acid lab. Verbal order received from PA Carmon Sails.

## 2018-11-23 NOTE — ED Notes (Signed)
Call to [facility], patient being discharged.  All questions and concerns addressed.  Transportation on way to pick up patient.

## 2018-11-23 NOTE — ED Notes (Signed)
Patient picked up by [facility] care.  Patient unable to sign. Patient assisted to [place].  All belongings with patient.

## 2018-11-23 NOTE — ED Triage Notes (Signed)
Patient presents to the ED via [facility] due to falling today with difficulty walking since yesterday.   Right leg appears swollen and warm to touch.  Patient is from a care home with history of MR, Schizophrenia, dementia and psychosis.

## 2018-11-23 NOTE — ED Provider Notes (Signed)
[facility] EMERGENCY DEPARTMENT Provider Note   CSN: [id number] Arrival date & time: 11/23/18  1228     History   Chief Complaint Chief Complaint  Patient presents with   Leg Swelling    right leg    HPI [name] is a 68 y.o. male with history of mental retardation, schizophrenia, Parkinson's, dementia brought to the ER from [name] for evaluation of weakness.  Patient was seen in the ER for right lower extremity swelling and was diagnosed with a DVT.  He was discharged on Xarelto.  Per triage note, patient appears to be weak, unable to walk.  Right lower extremity is noted to be warm to the touch and swollen.  Per report, patient at baseline is able to ambulate on his own and he is unable to do this today.  There is report of a fall but unclear of the details or if it was witnessed.  Patient can provide me very brief "yes" and "no" answers but otherwise is a poor historian with difficult to understand speech.  Level 5 caveat applies due to mental retardation, psychiatric illness, dementia.  We will attempt to contact caregivers to obtain further information.     HPI  Past Medical History:  Diagnosis Date   Cellulitis    Dementia ([place])    Edema    Hypertension    Hypoglycemia    Mental retardation    Parkinson's disease ([place])    Psychosis ([place])    Renal insufficiency     Patient Active Problem List   Diagnosis Date Noted   Cellulitis 10/13/2018   Schizophrenia ([place]) 10/13/2018   Parkinson disease ([place]) 10/13/2018   Dementia ([place]) 10/13/2018   Encounter for screening colonoscopy 01/30/2015    Past Surgical History:  Procedure Laterality Date   COLONOSCOPY N/A 03/12/2015   Procedure: COLONOSCOPY;  Surgeon: Danie Binder, MD;  Location: [facility];  Service: Endoscopy;  Laterality: N/A;  1430 - moved to 1/16 @ 1:45 - office to notify        Home Medications    Prior to Admission medications   Medication Sig Start  Date End Date Taking? Authorizing Provider  acetaminophen (Q-PAP) 500 MG tablet Take 500 mg by mouth every 6 (six) hours as needed. For pain   Yes [provider]  busPIRone (BUSPAR) 15 MG tablet Take 15 mg by mouth 2 (two) times daily.   Yes [provider]  carbidopa-levodopa (SINEMET IR) 25-100 MG per tablet Take 2 tablets by mouth 4 (four) times daily.   Yes [provider]  clonazePAM (KLONOPIN) 0.5 MG tablet Take 0.25 mg by mouth daily as needed for anxiety.   Yes [provider]  cloZAPine (CLOZARIL) 25 MG tablet Take 75 mg by mouth at bedtime.    Yes [provider]  donepezil (ARICEPT) 10 MG tablet Take 10 mg by mouth daily.   Yes [provider]  magnesium oxide (MAG-OX) 400 MG tablet Take 400 mg by mouth 2 (two) times daily.   Yes [provider]  polyethylene glycol powder (GAVILAX) 17 GM/SCOOP powder Take 17 g by mouth daily.   Yes [provider]  potassium chloride SA (K-DUR,KLOR-CON) 20 MEQ tablet Take 40 mEq by mouth 2 (two) times daily.   Yes [provider]  rasagiline (AZILECT) 0.5 MG TABS tablet Take 1 mg by mouth daily.   Yes [provider]  silver sulfADIAZINE (SILVADENE) 1 % cream Apply 1 application topically  daily.   Yes [provider]  tamsulosin (FLOMAX) 0.4 MG CAPS capsule Take 0.4 mg by mouth daily.   Yes [provider]  torsemide (DEMADEX) 20 MG tablet Take 40 mg by mouth 2 (two) times daily.    Yes [provider]  Rivaroxaban 15 & 20 MG TBPK Follow package directions: Take one 52m tablet by mouth twice a day. On day 22, switch to one 242mtablet once a day. Take with food. Patient not taking: Reported on 11/23/2018 11/22/18   So[place] Ace  Family History Family History  Problem Relation Age of Onset   Colon cancer Other        Unknown family history    Social History Social History   Tobacco Use   Smoking status: Current  Every Day Smoker   Smokeless tobacco: Never Used  Substance Use Topics   Alcohol use: No    Alcohol/week: 0.0 standard drinks   Drug use: No     Allergies   Penicillins   Review of Systems Review of Systems  Unable to perform ROS: Dementia     Physical Exam Updated Vital Signs BP 114/81 (BP Location: Left Arm)    Pulse 61    Temp 97.9 F (36.6 C) (Oral)    Resp 13    Ht '5\' 6"'  (1.676 m)    Wt 85.7 kg    SpO2 100%    BMI 30.49 kg/m   Physical Exam Vitals signs and nursing note reviewed.  Constitutional:      General: He is not in acute distress.    Appearance: He is well-developed.     Comments: Awake.  Nobody at bedside.  HENT:     Head: Normocephalic and atraumatic.     Comments: No facial, scalp bone tenderness.  No signs of head or facial trauma.    Right Ear: External ear normal.     Left Ear: External ear normal.     Nose: Nose normal.     Mouth/Throat:     Comments: Dry lips, moist mucous membranes.  Edentulous.  No intraoral or tongue injury. Eyes:     General: No scleral icterus.    Conjunctiva/sclera: Conjunctivae normal.     Comments: No periorbital ecchymosis  Neck:     Musculoskeletal: Normal range of motion and neck supple.     Comments: No midline or paraspinal muscle tenderness. Cardiovascular:     Rate and Rhythm: Normal rate and regular rhythm.     Heart sounds: Normal heart sounds.     Comments: Asymmetric R LE edema.  1+ pitting edema from the foot to the knee with associated warmth, dry and peeling skin.  No erythema, wounds, drainage, bleeding.  No obvious signs of tenderness with palpation of the right lower extremity.  1+ DP pulses bilaterally. Pulmonary:     Effort: Pulmonary effort is normal.     Breath sounds: Normal breath sounds.  Abdominal:     General: Abdomen is flat.     Palpations: Abdomen is soft.     Tenderness: There is no abdominal tenderness.  Musculoskeletal: Normal range of motion.        General: No deformity.      Comments: No bony tenderness to the prominences of the shoulder, elbows, wrists.  Full range of motion of the upper extremities without any pain. No bony tenderness to the prominences of the hips, knees, ankles.  Full range of motion of the lower extremities without any  pain. TL spine: No midline or paraspinal muscle tenderness.  No bruising to the back.  Skin:    General: Skin is warm and dry.     Capillary Refill: Capillary refill takes less than 2 seconds.  Neurological:     Mental Status: He is alert. He is disoriented.     Cranial Nerves: Dysarthria present.     Coordination: Coordination abnormal.     Gait: Gait abnormal.     Comments: Exam is difficult and incomplete due to underplaying speech impediment, psychiatric illness.   Mental Status: Patient is awake, alert.  Patient unable to give a clear and coherent history.  Speech is dysarthric.   Cranial Nerves: I not tested II visual fields cannot be tested. PERRL bilaterally/  III, IV, VI EOMs intact without ptosis or diplopia  V cannot be tested   VII facial movements symmetric bilaterally VIII hearing intact to voice/conversation  IX, X no uvula deviation, symmetric rise of soft palate/uvula XI unable to be tested  XII tongue protrusion midline, symmetric L/R movements  Motor: Strength 5/5 in upper/lower extremities .   Sensation grossly intact in face, upper/lower extremities. No pronator drift. SLR reveals no drop, some wavering with bilateral LEs but able to hold for 5+ seconds with encouragement   Cerebellar: Right truncal sway with sitting on side of the bed.  Needs assistance to stand up from bed.  Able to take 2-3 steps with minimal assistance but gait is choppy, unsteady, wide stance  Psychiatric:     Comments: Gets agitated if he cannot hold his fanny pack      ED Treatments / Results  Labs (all labs ordered are listed, but only abnormal results are displayed) Labs Reviewed  CBC WITH  DIFFERENTIAL/PLATELET - Abnormal; Notable for the following components:      Result Value   Hemoglobin 12.2 (*)    MCHC 29.7 (*)    All other components within normal limits  COMPREHENSIVE METABOLIC PANEL - Abnormal; Notable for the following components:   Potassium 3.2 (*)    Glucose, Bld 125 (*)    BUN 25 (*)    Creatinine, Ser 1.50 (*)    Calcium 8.7 (*)    GFR calc non Af Amer 47 (*)    GFR calc Af Amer 55 (*)    All other components within normal limits  URINALYSIS, ROUTINE W REFLEX MICROSCOPIC - Abnormal; Notable for the following components:   Color, Urine STRAW (*)    All other components within normal limits  LACTIC ACID, PLASMA  TROPONIN I (HIGH SENSITIVITY)    EKG EKG Interpretation  Date/Time:  Tuesday November 23 2018 13:14:17 EDT Ventricular Rate:  68 PR Interval:    QRS Duration: 105 QT Interval:  389 QTC Calculation: 414 R Axis:   66 Text Interpretation:  Sinus rhythm Paired ventricular premature complexes Short PR interval Probable anteroseptal infarct, old Poor data quality Confirmed by Aletta Edouard [phone]) on 11/23/2018 1:16:36 PM   Radiology Dg Chest 2 View  Result Date: 11/23/2018 CLINICAL DATA:  Weakness. EXAM: CHEST - 2 VIEW COMPARISON:  Portable chest 10/13/2018. FINDINGS: Mediastinum and hilar structures normal. Stable elevation left hemidiaphragm with mild left base atelectasis. No acute infiltrate. No pleural effusion or pneumothorax. IMPRESSION: Stable elevation left hemidiaphragm with mild atelectasis left lung base. No acute abnormality identified. Electronically Signed   By: Marcello Moores  Register   On: 11/23/2018 14:01   Ct Head Wo Contrast  Result Date: 11/23/2018 CLINICAL DATA:  Altered mental status  EXAM: CT HEAD WITHOUT CONTRAST TECHNIQUE: Contiguous axial images were obtained from the base of the skull through the vertex without intravenous contrast. COMPARISON:  None. FINDINGS: Brain: No evidence of acute infarction, hemorrhage,  hydrocephalus, extra-axial collection or mass lesion/mass effect. Scattered low-density changes within the periventricular and subcortical white matter compatible with chronic microvascular ischemic change. Mild diffuse cerebral volume loss. Vascular: Mild atherosclerotic calcifications involving the large vessels of the skull base. No unexpected hyperdense vessel. Skull: Normal. Negative for fracture or focal lesion. Sinuses/Orbits: No acute finding. Other: None. IMPRESSION: No acute intracranial findings. Electronically Signed   By: Davina Poke M.D.   On: 11/23/2018 16:13   US Venous Img Lower Unilateral Right  Result Date: 11/22/2018 CLINICAL DATA:  Lower extremity edema and pain. EXAM: RIGHT LOWER EXTREMITY VENOUS DOPPLER ULTRASOUND TECHNIQUE: Gray-scale sonography with graded compression, as well as color Doppler and duplex ultrasound were performed to evaluate the lower extremity deep venous systems from the level of the common femoral vein and including the common femoral, femoral, profunda femoral, popliteal and calf veins including the posterior tibial, peroneal and gastrocnemius veins when visible. The superficial great saphenous vein was also interrogated. Spectral Doppler was utilized to evaluate flow at rest and with distal augmentation maneuvers in the common femoral, femoral and popliteal veins. COMPARISON:  September 13, 2011.  October 13, 2018. FINDINGS: Contralateral Common Femoral Vein: Respiratory phasicity is normal and symmetric with the symptomatic side. No evidence of thrombus. Normal compressibility. Common Femoral Vein: No evidence of thrombus. Normal compressibility, respiratory phasicity and response to augmentation. Saphenofemoral Junction: No evidence of thrombus. Normal compressibility and flow on color Doppler imaging. Profunda Femoral Vein: No evidence of thrombus. Normal compressibility and flow on color Doppler imaging. Femoral Vein: No evidence of thrombus. Normal  compressibility, respiratory phasicity and response to augmentation. Popliteal Vein: No evidence of thrombus. Normal compressibility, respiratory phasicity and response to augmentation. Calf Veins: There is nonocclusive thrombus in the posterior tibial vein. Superficial Great Saphenous Vein: There is thrombus within the great saphenous vein. Other Findings:  Nonspecific lower extremity edema is noted. IMPRESSION: 1. Study is positive for a nonocclusive DVT involving the posterior tibial vein on the right. 2. Superficial thrombophlebitis involving the distal great saphenous vein. 3. Lower extremity edema. Electronically Signed   By: Constance Holster M.D.   On: 11/22/2018 16:42    Procedures Procedures (including critical care time)  Medications Ordered in ED Medications  sodium chloride 0.9 % bolus 1,000 mL (0 mLs Intravenous Stopped 11/23/18 1615)  Rivaroxaban (XARELTO) tablet 15 mg (15 mg Oral Given 11/23/18 1614)     Initial Impression / Assessment and Plan / ED Course  I have reviewed the triage vital signs and the nursing notes.  Pertinent labs & imaging results that were available during my care of the patient were reviewed by me and considered in my medical decision making (see chart for details).  Clinical Course as of Nov 22 1713  Tue Nov 22, 5452  2324 68 year old male with recent evaluation of right leg swelling here with worsening symptoms and having fallen.  He is a known DVT and has not been able to get anticoagulation due to cost.  Will review with social work to see if we can get him back on his medications.   [MB]  7209 I spoke to point of contacts [employer].  States she got a call from staff this morning feeding patient did not want to eat.  He usually has a very big  appetite.  Also staff noticed continued right leg swelling, warmth and blackness.  Patient is usually taken care of by 1 caregiver but caregiver was not able to get the patient to stand or walk on his own.   Patient needed 2 person assist today to ambulate.  Also states that Xarelto is too expensive for the patient and cannot afford it.   [CG]    Clinical Course User Index [CG] Kinnie Feil, PA-C [MB] Hayden Rasmussen, MD   67 year old here with difficulty walking, generalized weakness, decreased appetite.  Level 5 caveat due to underlying MR, schizophrenia.  Patient is EMR reviewed to obtain pertinent PMH.  I also spoke to caregiver to obtain corroborating history.  He was seen in the ER yesterday for RLE swelling, diagnosed with DVT.  Prescribed Xarelto.  Exam today is reassuring.  Patient initially shows no signs of trauma but triage note documented possible fall.  Given unreliable history, head CT was obtained which does not show any acute traumatic injuries.  Thorough head to toe exam does not reveal any signs of physical trauma.  I spoke to caregiver who clarified patient did not actually fall but looks like he was falling when he tried to walk.  Given general report of weakness, infectious work-up initiated.  I reviewed patient's ER work-up and its benign.  No signs of infection in the urinalysis.  Chest x-ray without signs of infection.  No leukocytosis.  Hemoglobin is 12.2.  Potassium is 3.2.  Creatinine 1.50, BUN 25.  He has dry lips and suspect mild dehydration since he has not eaten or drink anything today.  EKGs without signs of ischemia, arrhythmias.  Troponin is 3.  There was no complaints of chest pain and I doubt cardiac etiology to his symptoms.  High suspicion for difficulty walking due to his right lower extremity DVT and likely some pain and swelling and heaviness from it.  On exam he has no neuro deficits or unilateral weakness.  He can do SLR bilaterally and hold for several seconds.  I personally ambulated patient twice and he needed minimal assist.  He ambulated with a walker out of the room and back without difficulty.  RN at bedside.  Patient given IV fluids, he is eating  and drinking here in the ER.  Caregiver reported difficulty affording Xarelto.  Social work/case management met with patient and has given the coupon card.  Explained to caregiver importance of following with PCP in the next 1 month to determine ongoing anticoagulation.  Given benign exam, work-up here I think patient is appropriate for discharge.  Shared with EDP.   Final Clinical Impressions(s) / ED Diagnoses   Final diagnoses:  Leg swelling  Difficulty walking    ED Discharge Orders    None       Kinnie Feil, PA-C 11/23/18 1715    Hayden Rasmussen, MD 11/24/18 [phone]

## 2018-11-25 DIAGNOSIS — I82409 Acute embolism and thrombosis of unspecified deep veins of unspecified lower extremity: Secondary | ICD-10-CM | POA: Diagnosis not present

## 2018-11-26 DIAGNOSIS — I10 Essential (primary) hypertension: Secondary | ICD-10-CM | POA: Diagnosis not present

## 2018-11-26 DIAGNOSIS — G20 Parkinson's disease: Secondary | ICD-10-CM | POA: Diagnosis not present

## 2018-11-26 DIAGNOSIS — F0390 Unspecified dementia without behavioral disturbance: Secondary | ICD-10-CM | POA: Diagnosis not present

## 2018-11-26 DIAGNOSIS — I82401 Acute embolism and thrombosis of unspecified deep veins of right lower extremity: Secondary | ICD-10-CM | POA: Diagnosis not present

## 2018-11-28 ENCOUNTER — Emergency Department (HOSPITAL_COMMUNITY): Payer: Medicare Other

## 2018-11-28 ENCOUNTER — Inpatient Hospital Stay (HOSPITAL_COMMUNITY): Payer: Medicare Other

## 2018-11-28 ENCOUNTER — Other Ambulatory Visit: Payer: Self-pay

## 2018-11-28 ENCOUNTER — Encounter (HOSPITAL_COMMUNITY): Payer: Self-pay | Admitting: Emergency Medicine

## 2018-11-28 ENCOUNTER — Inpatient Hospital Stay (HOSPITAL_COMMUNITY)
Admission: EM | Admit: 2018-11-28 | Discharge: 2018-12-04 | DRG: 872 | Disposition: A | Discharge status: Home Health | Payer: Medicare Other | Attending: Family Medicine | Admitting: Family Medicine

## 2018-11-28 DIAGNOSIS — N390 Urinary tract infection, site not specified: Secondary | ICD-10-CM | POA: Diagnosis present

## 2018-11-28 DIAGNOSIS — F0390 Unspecified dementia without behavioral disturbance: Secondary | ICD-10-CM | POA: Diagnosis present

## 2018-11-28 DIAGNOSIS — R531 Weakness: Secondary | ICD-10-CM | POA: Diagnosis present

## 2018-11-28 DIAGNOSIS — N179 Acute kidney failure, unspecified: Secondary | ICD-10-CM | POA: Diagnosis present

## 2018-11-28 DIAGNOSIS — F0280 Dementia in other diseases classified elsewhere without behavioral disturbance: Secondary | ICD-10-CM | POA: Diagnosis present

## 2018-11-28 DIAGNOSIS — I10 Essential (primary) hypertension: Secondary | ICD-10-CM | POA: Diagnosis present

## 2018-11-28 DIAGNOSIS — R609 Edema, unspecified: Secondary | ICD-10-CM | POA: Diagnosis not present

## 2018-11-28 DIAGNOSIS — R627 Adult failure to thrive: Secondary | ICD-10-CM | POA: Diagnosis present

## 2018-11-28 DIAGNOSIS — Z20828 Contact with and (suspected) exposure to other viral communicable diseases: Secondary | ICD-10-CM | POA: Diagnosis present

## 2018-11-28 DIAGNOSIS — F79 Unspecified intellectual disabilities: Secondary | ICD-10-CM | POA: Diagnosis present

## 2018-11-28 DIAGNOSIS — Z6825 Body mass index (BMI) 25.0-25.9, adult: Secondary | ICD-10-CM

## 2018-11-28 DIAGNOSIS — I82409 Acute embolism and thrombosis of unspecified deep veins of unspecified lower extremity: Secondary | ICD-10-CM | POA: Diagnosis present

## 2018-11-28 DIAGNOSIS — R296 Repeated falls: Secondary | ICD-10-CM | POA: Diagnosis present

## 2018-11-28 DIAGNOSIS — A4151 Sepsis due to Escherichia coli [E. coli]: Secondary | ICD-10-CM | POA: Diagnosis present

## 2018-11-28 DIAGNOSIS — Z79899 Other long term (current) drug therapy: Secondary | ICD-10-CM

## 2018-11-28 DIAGNOSIS — F209 Schizophrenia, unspecified: Secondary | ICD-10-CM | POA: Diagnosis present

## 2018-11-28 DIAGNOSIS — Z0389 Encounter for observation for other suspected diseases and conditions ruled out: Secondary | ICD-10-CM | POA: Diagnosis not present

## 2018-11-28 DIAGNOSIS — M7989 Other specified soft tissue disorders: Secondary | ICD-10-CM | POA: Diagnosis not present

## 2018-11-28 DIAGNOSIS — R6250 Unspecified lack of expected normal physiological development in childhood: Secondary | ICD-10-CM | POA: Diagnosis present

## 2018-11-28 DIAGNOSIS — A419 Sepsis, unspecified organism: Secondary | ICD-10-CM | POA: Diagnosis not present

## 2018-11-28 DIAGNOSIS — I82441 Acute embolism and thrombosis of right tibial vein: Secondary | ICD-10-CM

## 2018-11-28 DIAGNOSIS — E860 Dehydration: Secondary | ICD-10-CM | POA: Diagnosis present

## 2018-11-28 DIAGNOSIS — Z880 Allergy status to penicillin: Secondary | ICD-10-CM

## 2018-11-28 DIAGNOSIS — Z8672 Personal history of thrombophlebitis: Secondary | ICD-10-CM

## 2018-11-28 DIAGNOSIS — Z86718 Personal history of other venous thrombosis and embolism: Secondary | ICD-10-CM | POA: Diagnosis not present

## 2018-11-28 DIAGNOSIS — G20 Parkinson's disease: Secondary | ICD-10-CM

## 2018-11-28 LAB — COMPREHENSIVE METABOLIC PANEL
ALT: 7 U/L (ref 0–44)
AST: 100 U/L — ABNORMAL HIGH (ref 15–41)
Albumin: 3.5 g/dL (ref 3.5–5.0)
Alkaline Phosphatase: 84 U/L (ref 38–126)
Anion gap: 13 (ref 5–15)
BUN: 65 mg/dL — ABNORMAL HIGH (ref 8–23)
CO2: 25 mmol/L (ref 22–32)
Calcium: 9.2 mg/dL (ref 8.9–10.3)
Chloride: 102 mmol/L (ref 98–111)
Creatinine, Ser: 3.13 mg/dL — ABNORMAL HIGH (ref 0.61–1.24)
GFR calc Af Amer: 22 mL/min — ABNORMAL LOW (ref >60)
GFR calc non Af Amer: 19 mL/min — ABNORMAL LOW (ref >60)
Glucose, Bld: 104 mg/dL — ABNORMAL HIGH (ref 70–99)
Potassium: 4.6 mmol/L (ref 3.5–5.1)
Sodium: 140 mmol/L (ref 135–145)
Total Bilirubin: 1.4 mg/dL — ABNORMAL HIGH (ref 0.3–1.2)
Total Protein: 7.7 g/dL (ref 6.5–8.1)

## 2018-11-28 LAB — CBC WITH DIFFERENTIAL/PLATELET
Abs Immature Granulocytes: 0.33 10*3/uL — ABNORMAL HIGH (ref 0.00–0.07)
Basophils Absolute: 0 10*3/uL (ref 0.0–0.1)
Basophils Relative: 0 %
Eosinophils Absolute: 0 10*3/uL (ref 0.0–0.5)
Eosinophils Relative: 0 %
HCT: 42.1 % (ref 39.0–52.0)
Hemoglobin: 12.9 g/dL — ABNORMAL LOW (ref 13.0–17.0)
Immature Granulocytes: 2 %
Lymphocytes Relative: 4 %
Lymphs Abs: 0.7 10*3/uL (ref 0.7–4.0)
MCH: 27.1 pg (ref 26.0–34.0)
MCHC: 30.6 g/dL (ref 30.0–36.0)
MCV: 88.4 fL (ref 80.0–100.0)
Monocytes Absolute: 0.9 10*3/uL (ref 0.1–1.0)
Monocytes Relative: 5 %
Neutro Abs: 14.8 10*3/uL — ABNORMAL HIGH (ref 1.7–7.7)
Neutrophils Relative %: 89 %
Platelets: 179 10*3/uL (ref 150–400)
RBC: 4.76 MIL/uL (ref 4.22–5.81)
RDW: 15.8 % — ABNORMAL HIGH (ref 11.5–15.5)
WBC: 16.7 10*3/uL — ABNORMAL HIGH (ref 4.0–10.5)
nRBC: 0.2 % (ref 0.0–0.2)

## 2018-11-28 LAB — URINALYSIS, ROUTINE W REFLEX MICROSCOPIC
Bilirubin Urine: NEGATIVE
Glucose, UA: NEGATIVE mg/dL
Ketones, ur: NEGATIVE mg/dL
Nitrite: NEGATIVE
Protein, ur: 30 mg/dL — AB
Specific Gravity, Urine: 1.008 (ref 1.005–1.030)
WBC, UA: >50 WBC/hpf — ABNORMAL HIGH (ref 0–5)
pH: 5 (ref 5.0–8.0)

## 2018-11-28 LAB — D-DIMER, QUANTITATIVE: D-Dimer, Quant: 7.21 ug/mL-FEU — ABNORMAL HIGH (ref 0.00–0.50)

## 2018-11-28 LAB — APTT: aPTT: 37 seconds — ABNORMAL HIGH (ref 24–36)

## 2018-11-28 LAB — PROTIME-INR
INR: 1.2 (ref 0.8–1.2)
Prothrombin Time: 15.3 seconds — ABNORMAL HIGH (ref 11.4–15.2)

## 2018-11-28 LAB — LACTIC ACID, PLASMA
Lactic Acid, Venous: 2.6 mmol/L (ref 0.5–1.9)
Lactic Acid, Venous: 2.1 mmol/L (ref 0.5–1.9)

## 2018-11-28 MED ORDER — SODIUM CHLORIDE 0.9 % IV SOLN
1.0000 g | Freq: Once | INTRAVENOUS | Status: AC
  Administered 2018-11-28: 1 g via INTRAVENOUS
  Filled 2018-11-28: qty 10

## 2018-11-28 MED ORDER — CLONAZEPAM 0.5 MG PO TABS
0.2500 mg | ORAL_TABLET | Freq: Every day | ORAL | Status: DC
  Administered 2018-11-28: 21:00:00 0.25 mg via ORAL
  Filled 2018-11-28 (×2): qty 1

## 2018-11-28 MED ORDER — DOCUSATE SODIUM 100 MG PO CAPS
100.0000 mg | ORAL_CAPSULE | Freq: Two times a day (BID) | ORAL | Status: DC
  Administered 2018-11-28 – 2018-12-02 (×7): 100 mg via ORAL
  Filled 2018-11-28 (×8): qty 1

## 2018-11-28 MED ORDER — ENOXAPARIN SODIUM 100 MG/ML ~~LOC~~ SOLN
1.0000 mg/kg | SUBCUTANEOUS | Status: DC
  Administered 2018-11-28: 85 mg via SUBCUTANEOUS
  Filled 2018-11-28: qty 1

## 2018-11-28 MED ORDER — RASAGILINE MESYLATE 1 MG PO TABS
1.0000 mg | ORAL_TABLET | Freq: Every day | ORAL | Status: DC
  Administered 2018-11-29 – 2018-12-04 (×6): 1 mg via ORAL
  Filled 2018-11-28 (×8): qty 1

## 2018-11-28 MED ORDER — TORSEMIDE 20 MG PO TABS
40.0000 mg | ORAL_TABLET | Freq: Two times a day (BID) | ORAL | Status: DC
  Administered 2018-11-28 – 2018-12-04 (×12): 40 mg via ORAL
  Filled 2018-11-28 (×14): qty 2

## 2018-11-28 MED ORDER — CIPROFLOXACIN HCL 250 MG PO TABS
500.0000 mg | ORAL_TABLET | Freq: Every day | ORAL | Status: DC
  Administered 2018-11-28: 19:00:00 500 mg via ORAL
  Filled 2018-11-28: qty 2

## 2018-11-28 MED ORDER — POLYETHYLENE GLYCOL 3350 17 GM/SCOOP PO POWD
17.0000 g | Freq: Every day | ORAL | Status: DC
  Filled 2018-11-28: qty 255

## 2018-11-28 MED ORDER — TECHNETIUM TO 99M ALBUMIN AGGREGATED
1.6000 | Freq: Once | INTRAVENOUS | Status: AC | PRN
  Administered 2018-11-28: 1.6 via INTRAVENOUS

## 2018-11-28 MED ORDER — SODIUM CHLORIDE 0.9 % IV BOLUS
1000.0000 mL | Freq: Once | INTRAVENOUS | Status: AC
  Administered 2018-11-28: 14:00:00 1000 mL via INTRAVENOUS

## 2018-11-28 MED ORDER — DONEPEZIL HCL 5 MG PO TABS
10.0000 mg | ORAL_TABLET | Freq: Every day | ORAL | Status: DC
  Administered 2018-11-29 – 2018-12-04 (×6): 10 mg via ORAL
  Filled 2018-11-28 (×2): qty 2
  Filled 2018-11-28: qty 1
  Filled 2018-11-28 (×4): qty 2

## 2018-11-28 MED ORDER — TAMSULOSIN HCL 0.4 MG PO CAPS
0.4000 mg | ORAL_CAPSULE | Freq: Every day | ORAL | Status: DC
  Administered 2018-11-29 – 2018-12-04 (×6): 0.4 mg via ORAL
  Filled 2018-11-28 (×6): qty 1

## 2018-11-28 MED ORDER — ACETAMINOPHEN 325 MG PO TABS
650.0000 mg | ORAL_TABLET | Freq: Once | ORAL | Status: AC
  Administered 2018-11-28: 650 mg via ORAL
  Filled 2018-11-28: qty 2

## 2018-11-28 MED ORDER — ACETAMINOPHEN 500 MG PO TABS: 500.0000 mg | ORAL_TABLET | Freq: Four times a day (QID) | ORAL | Status: DC | PRN

## 2018-11-28 MED ORDER — ORAL CARE MOUTH RINSE
15.0000 mL | Freq: Two times a day (BID) | OROMUCOSAL | Status: DC
  Administered 2018-11-29 – 2018-12-04 (×9): 15 mL via OROMUCOSAL

## 2018-11-28 MED ORDER — CHLORHEXIDINE GLUCONATE 0.12 % MT SOLN
15.0000 mL | Freq: Two times a day (BID) | OROMUCOSAL | Status: DC
  Administered 2018-11-28 – 2018-12-04 (×11): 15 mL via OROMUCOSAL
  Filled 2018-11-28 (×11): qty 15

## 2018-11-28 MED ORDER — CLOZAPINE 25 MG PO TABS
75.0000 mg | ORAL_TABLET | Freq: Every day | ORAL | Status: DC
  Administered 2018-11-28 – 2018-12-03 (×6): 75 mg via ORAL
  Filled 2018-11-28 (×8): qty 3

## 2018-11-28 MED ORDER — DEXTROSE-NACL 5-0.45 % IV SOLN
INTRAVENOUS | Status: DC
  Administered 2018-11-28 – 2018-12-02 (×7): via INTRAVENOUS

## 2018-11-28 MED ORDER — BUSPIRONE HCL 5 MG PO TABS
15.0000 mg | ORAL_TABLET | Freq: Two times a day (BID) | ORAL | Status: DC
  Administered 2018-11-28 – 2018-12-04 (×12): 15 mg via ORAL
  Filled 2018-11-28 (×12): qty 3

## 2018-11-28 MED ORDER — CARBIDOPA-LEVODOPA 25-100 MG PO TABS
2.0000 | ORAL_TABLET | Freq: Four times a day (QID) | ORAL | Status: DC
  Administered 2018-11-28 – 2018-12-04 (×23): 2 via ORAL
  Filled 2018-11-28 (×28): qty 2

## 2018-11-28 MED ORDER — SODIUM CHLORIDE 0.9 % IV BOLUS
1000.0000 mL | Freq: Once | INTRAVENOUS | Status: AC
  Administered 2018-11-28: 1000 mL via INTRAVENOUS

## 2018-11-28 MED ORDER — POTASSIUM CHLORIDE CRYS ER 20 MEQ PO TBCR
40.0000 meq | EXTENDED_RELEASE_TABLET | Freq: Two times a day (BID) | ORAL | Status: DC
  Administered 2018-11-28 – 2018-12-04 (×12): 40 meq via ORAL
  Filled 2018-11-28 (×12): qty 2

## 2018-11-28 NOTE — H&P (Signed)
History and Physical    [name] CHE:[id number] DOB: Oct 15, 1950 DOA: 11/28/2018  PCP: Rosita Fire, MD (Confirm with patient/family/NH records and if not entered, this has to be entered at [facility] point of entry) Patient coming from: ALS  I have personally briefly reviewed patient's old medical records in [place]  Chief Complaint: Increased weakness and non-abmulatory, loss of appetite, failure to thrive  HPI:  The history is provided by a caregiver. The history is limited by a developmental delay. No language interpreter was used.   68 year old male with history of dementia, Parkinson's, schizophrenia, mental retardation, recently diagnosed with right lower extremity DVT on 11/22/2018. He is a resident at [facility] brought here via EMS for evaluation of weakness.  Patient is a poor historian.  From prior note from 4 days ago when patient was seen in the ED 9/28 and 11/23/2018. Patient has a known right lower extremity DVT and was  having trouble obtaining his Xarelto anticoagulant medication going unprotected for several days.  He also has generalized weakness and recurrent falls.  He was evaluated and it was felt that his difficulty walking is due to his right lower extremity DVT.  He received IV fluid and was able to tolerate p.o. and subsequently discharged home with social work and case management involvement to help with Xarelto.  Today I was able to talk to patient's caregiver at the facility via the phone.  Patient did show some mild improvement when he was discharged from the ER however within the past 2 days he has been not himself.  He is weak, unable to get up without assistance, unable to ambulate or care for himself.  He slumped over and is not eating which is unusual for him.  He does not specifically complain of anything but due to his progressive decline in his strength and mental status, staff felt patient will need to evaluate further and arranged  for [facility]. No report of infections, no report of increased WOB, but they are not equipped to check pulse oximetry.  ED Course: Patient hemodynamically stable. Lasb studies revealed leukocytosis to  16.7 with left shift, U/A w/ >50 WBC hpf, few bacteria. CXR NAD.   Review of Systems: As per HPI otherwise 10 point review of systems negative. Caveat - poor historian   Past Medical History:  Diagnosis Date   Cellulitis    Dementia ([place])    Edema    Hypertension    Hypoglycemia    Mental retardation    Parkinson's disease ([place])    Psychosis ([place])    Renal insufficiency     Past Surgical History:  Procedure Laterality Date   COLONOSCOPY N/A 03/12/2015   Procedure: COLONOSCOPY;  Surgeon: Danie Binder, MD;  Location[facility];  Service: Endoscopy;  Laterality: N/A;  1430 - moved to 1/16 @ 1:45 - office to notify   History per ALS.   reports that he has been smoking. He has never used smokeless tobacco. He reports that he does not drink alcohol or use drugs.  Allergies  Allergen Reactions   Penicillins     .Did it involve swelling of the face/tongue/throat, SOB, or low BP? Unknown Did it involve sudden or severe rash/hives, skin peeling, or any reaction on the inside of your mouth or nose? Unknown Did you need to seek medical attention at a hospital or doctor's office? Unknown When did it last happen?Unknown If all above answers are NO, may proceed with  cephalosporin use.     Family History  Problem Relation Age of Onset   Colon cancer Other        Unknown family history     Prior to Admission medications   Medication Sig Start Date End Date Taking? Authorizing Provider  acetaminophen (Q-PAP) 500 MG tablet Take 500 mg by mouth every 6 (six) hours as needed. For pain   Yes [provider]  busPIRone (BUSPAR) 15 MG tablet Take 15 mg by mouth 2 (two) times daily.   Yes [provider]  carbidopa-levodopa (SINEMET IR)  25-100 MG per tablet Take 2 tablets by mouth 4 (four) times daily.   Yes [provider]  clonazePAM (KLONOPIN) 0.5 MG tablet Take 0.25 mg by mouth daily as needed for anxiety.   Yes [provider]  cloZAPine (CLOZARIL) 25 MG tablet Take 75 mg by mouth at bedtime.    Yes [provider]  donepezil (ARICEPT) 10 MG tablet Take 10 mg by mouth daily.   Yes [provider]  magnesium oxide (MAG-OX) 400 MG tablet Take 400 mg by mouth 2 (two) times daily.   Yes [provider]  potassium chloride SA (K-DUR,KLOR-CON) 20 MEQ tablet Take 40 mEq by mouth 2 (two) times daily.   Yes [provider]  rasagiline (AZILECT) 0.5 MG TABS tablet Take 1 mg by mouth daily.   Yes [provider]  Rivaroxaban 15 & 20 MG TBPK Follow package directions: Take one 15mg  tablet by mouth twice a day. On day 22, switch to one 20mg  tablet once a day. Take with food. 11/22/18  Yes Caryl Ada K, PA-C  silver sulfADIAZINE (SILVADENE) 1 % cream Apply 1 application topically daily.   Yes [provider]  tamsulosin (FLOMAX) 0.4 MG CAPS capsule Take 0.4 mg by mouth daily.   Yes [provider]  torsemide (DEMADEX) 20 MG tablet Take 40 mg by mouth 2 (two) times daily.    Yes [provider]  polyethylene glycol powder (GAVILAX) 17 GM/SCOOP powder Take 17 g by mouth daily.    [provider]    Physical Exam: Vitals:   11/28/18 1416 11/28/18 1554 11/28/18 1638 11/28/18 1653  BP:  125/85  117/83  Pulse:  86  97  Resp:  16  16  Temp: 99.6 F (37.6 C)  (!) 101 F (38.3 C)   TempSrc: Rectal  Rectal   SpO2:  93%  100%  Weight:      Height:        Constitutional: NAD, calm, comfortable Vitals:   11/28/18 1416 11/28/18 1554 11/28/18 1638 11/28/18 1653  BP:  125/85  117/83  Pulse:  86  97  Resp:  16  16  Temp: 99.6 F (37.6 C)  (!) 101 F (38.3 C)   TempSrc: Rectal  Rectal   SpO2:  93%  100%  Weight:      Height:        General appearance: Gaunt elderly, tall man in no distress Eyes: PERRL, lids and conjunctivae normal ENMT: Mucous membranes are dry. Posterior pharynx clear of any exudate or lesions.Edentulous.  Neck: normal, supple, no masses, no thyromegaly Respiratory: clear to auscultation bilaterally, no wheezing, no crackles. Normal respiratory effort. No accessory muscle use.  Cardiovascular: Regular rate and rhythm on prolonged radial pulse observation and precordial auscultation, no murmurs / rubs / gallops. 1+ LE extremity edema. 2+ pedal pulse Right, 2+ femoral and popliteal pulse left, trace DP pulse left. No carotid  bruits.  Abdomen: no tenderness, no masses palpated. No hepatosplenomegaly. Bowel sounds positive.  Musculoskeletal: no clubbing / cyanosis. No joint deformity upper and lower extremities. Good ROM, no contractures. Decreased muscle tone. LE very cool to touch. Right distal LE very dark, calve slightly larger than left by appearance.  Skin: no rashes,ulcers. 2 cm deep abrasion right knee, abrasion noted on left knee. Sacrum clear. No induration Neurologic: CN 2-12 grossly intact. Sensation intact. Strength - MAE to command.   Psychiatric: Poor judgement, lacks insight. Minimal speech. Alert but could not assess orientation to place or context. Very flat affect.     Labs on Admission: I have personally reviewed following labs and imaging studies  CBC: Recent Labs  Lab 11/22/18 1427 11/23/18 1338 11/28/18 1312  WBC 7.4 9.9 16.7*  NEUTROABS 4.5 6.9 14.8*  HGB 12.1* 12.2* 12.9*  HCT 41.5 41.1 42.1  MCV 92.6 92.2 88.4  PLT 254 222 664   Basic Metabolic Panel: Recent Labs  Lab 11/22/18 1427 11/23/18 1338 11/28/18 1312  NA 140 139 140  K 3.6 3.2* 4.6  CL 99 100 102  CO2 32 31 25  GLUCOSE 161* 125* 104*  BUN 26* 25* 65*  CREATININE 1.72* 1.50* 3.13*  CALCIUM 8.9 8.7* 9.2  MG 2.6*  --   --    GFR: Estimated Creatinine Clearance: 24.8 mL/min (A) (by C-G formula based  on SCr of 3.13 mg/dL (H)). Liver Function Tests: Recent Labs  Lab 11/22/18 1427 11/23/18 1338 11/28/18 1312  AST 24 19 100*  ALT 13 13 7   ALKPHOS 59 56 84  BILITOT 0.9 1.0 1.4*  PROT 7.5 7.2 7.7  ALBUMIN 3.9 3.7 3.5   No results for input(s): LIPASE, AMYLASE in the last 168 hours. No results for input(s): AMMONIA in the last 168 hours. Coagulation Profile: Recent Labs  Lab 11/22/18 1427 11/28/18 1312  INR 1.0 1.2   Cardiac Enzymes: No results for input(s): CKTOTAL, CKMB, CKMBINDEX, TROPONINI in the last 168 hours. BNP (last 3 results) No results for input(s): PROBNP in the last 8760 hours. HbA1C: No results for input(s): HGBA1C in the last 72 hours. CBG: No results for input(s): GLUCAP in the last 168 hours. Lipid Profile: No results for input(s): CHOL, HDL, LDLCALC, TRIG, CHOLHDL, LDLDIRECT in the last 72 hours. Thyroid Function Tests: No results for input(s): TSH, T4TOTAL, FREET4, T3FREE, THYROIDAB in the last 72 hours. Anemia Panel: No results for input(s): VITAMINB12, FOLATE, FERRITIN, TIBC, IRON, RETICCTPCT in the last 72 hours. Urine analysis:    Component Value Date/Time   COLORURINE YELLOW 11/28/2018 1350   APPEARANCEUR CLOUDY (A) 11/28/2018 1350   LABSPEC 1.008 11/28/2018 1350   PHURINE 5.0 11/28/2018 1350   GLUCOSEU NEGATIVE 11/28/2018 1350   HGBUR LARGE (A) 11/28/2018 1350   BILIRUBINUR NEGATIVE 11/28/2018 1350   KETONESUR NEGATIVE 11/28/2018 1350   PROTEINUR 30 (A) 11/28/2018 1350   UROBILINOGEN 0.2 09/12/2011 1650   NITRITE NEGATIVE 11/28/2018 1350   LEUKOCYTESUR LARGE (A) 11/28/2018 1350    Radiological Exams on Admission: Dg Chest Portable 1 View  Result Date: 11/28/2018 CLINICAL DATA:  Chronic swelling in the legs. EXAM: PORTABLE CHEST 1 VIEW COMPARISON:  11/23/2018 FINDINGS: The heart size and mediastinal contours are within normal limits. Both lungs are clear. The visualized skeletal structures are unremarkable. IMPRESSION: No active disease.  Electronically Signed   By: Van Clines M.D.   On: 11/28/2018 14:04    EKG: Independently reviewed. 11/23/2018 NSR, #1 11/28/2018 NSR, lots of interference. #2  low voltage, Irregular with question of a. Fib with HR 85, incomplete LBBB  Assessment/Plan Active Problems:   Sepsis secondary to UTI ([place])   AKI (acute kidney injury) ([place])   DVT (deep venous thrombosis) ([place])   Schizophrenia ([place])   Dementia ([place])  (please populate well all problems here in Problem List. (For example, if patient is on BP meds at home and you resume or decide to hold them, it is a problem that needs to be her. Same for CAD, COPD, HLD and so on)   1. UTI with sepsis - patient with leukocytosis, positive UA, fever, fluctuating BP and profound weakness.No other source of infection identified. Plan  Regular admit  Cirpofloxacin 500 mg PO bid  F/u CBC  2. DVT - patient with DVT by LE venous u/s 9/28 right posterior tibial vein with thrombophlebitis right great saphenous vein. Was unable to afford Xarelto and went unprotected for several days. Leg remains tender. No report of respiratory symptoms but concern for possible PE. Plan D-dimer ordered  VQ scan ordered - patient with Cr 3.13  Pharmacy consulted for full dose Lovenox  For cost reasons consider warfarin for longer term anticoagulation at discharge.  3. AKI - patient with elevation in Cr from baseline, most likely prerenal azotemia. Patient has no h/o CHF Plan IVF at 75 cc/hr x 12 hrs  F/u Bmet in AM  4. Psych - will continue home meds  5. Parkinson's - will continue home meds.   6. Dispo - coming from [facility] - long term resident. PT/OT eval ordered re: need for [facility] when returns to [facility]  DVT prophylaxis: lovenox - full dose (Lovenox/Heparin/SCD's/anticoagulated/None (if comfort care) Code Status: full code (Full/Partial (specify details) Family Communication: spoke with [name] at [facility] facility  - listed as only contact.  (Specify name, relationship. Do  not write "discussed with patient". Specify tel # if discussed over the phone) Disposition Plan: [facility] with [facility] in 2-3 days (specify when and where you expect patient to be discharged) Consults called: none (with names) Admission status: inpatient (inpatient / obs / tele / medical floor / SDU)   Adella Hare MD [facility] Pager [phone]  If 7PM-7AM, please contact night-coverage [email or web address] Password [email or web address]  11/28/2018, 5:41 PM

## 2018-11-28 NOTE — ED Notes (Signed)
Date and time results received: 11/28/18 1516 (use smartphrase ".now" to insert current time)  Test: lactic acid  Critical Value: 2.6  Name of Provider Notified: Dr. Roderic Palau  Orders Received? Or Actions Taken?: n/a

## 2018-11-28 NOTE — ED Triage Notes (Signed)
Pt brought in from [facility] for difficulty walking for several weeks. Has chronic swelling in legs same as usual. Seen multiple times for same. Pt has MR, schizophrenia, dementia, and psychosis.

## 2018-11-28 NOTE — ED Provider Notes (Signed)
[facility] EMERGENCY DEPARTMENT Provider Note   CSN: [id number] Arrival date & time: 11/28/18  1216     History   Chief Complaint Chief Complaint  Patient presents with  . Weakness    HPI [name] is a 68 y.o. male.     The history is provided by a caregiver. The history is limited by a developmental delay. No language interpreter was used.  Weakness    68 year old male with history of dementia, Parkinson's, schizophrenia, mental retardation, recently diagnosed with right lower extremity DVT, is a resident at [facility] brought here via EMS for evaluation of weakness.  Patient is a poor historian.  From prior note from 4 days ago when patient was seen in the ED, patient has a known right lower extremity DVT which having trouble obtaining his Xarelto anticoagulant medication.  He also has generalized weakness and recurrent falls.  He was evaluated and it was felt that his difficulty walking is due to his right lower extremity DVT.  He received IV fluid and was able to tolerate p.o. and subsequently discharged home with social work and case management involvement to help with Xarelto.  I was able to talk to patient's caregiver at the facility via the phone.  Patient did show some mild improvement when he was discharged from the ER however within the past 2 days he has been not himself.  He is weak, unable to get up without assistance.  He slumped over and not eating.  He does not specifically complain of anything but due to his progressive decline in his strength and mental status, staff felt patient will need to evaluate further.  No report of any infectious symptoms.  Past Medical History:  Diagnosis Date  . Cellulitis   . Dementia ([place])   . Edema   . Hypertension   . Hypoglycemia   . Mental retardation   . Parkinson's disease ([place])   . Psychosis ([place])   . Renal insufficiency     Patient Active Problem List   Diagnosis Date Noted  . Cellulitis  10/13/2018  . Schizophrenia ([place]) 10/13/2018  . Parkinson disease ([place]) 10/13/2018  . Dementia ([place]) 10/13/2018  . Encounter for screening colonoscopy 01/30/2015    Past Surgical History:  Procedure Laterality Date  . COLONOSCOPY N/A 03/12/2015   Procedure: COLONOSCOPY;  Surgeon: Danie Binder, MD;  [facility];  Service: Endoscopy;  Laterality: N/A;  1430 - moved to 1/16 @ 1:45 - office to notify        Home Medications    Prior to Admission medications   Medication Sig Start Date End Date Taking? Authorizing Provider  acetaminophen (Q-PAP) 500 MG tablet Take 500 mg by mouth every 6 (six) hours as needed. For pain    [provider]  busPIRone (BUSPAR) 15 MG tablet Take 15 mg by mouth 2 (two) times daily.    [provider]  carbidopa-levodopa (SINEMET IR) 25-100 MG per tablet Take 2 tablets by mouth 4 (four) times daily.    [provider]  clonazePAM (KLONOPIN) 0.5 MG tablet Take 0.25 mg by mouth daily as needed for anxiety.    [provider]  cloZAPine (CLOZARIL) 25 MG tablet Take 75 mg by mouth at bedtime.     [provider]  donepezil (ARICEPT) 10 MG tablet Take 10 mg by mouth daily.    [provider]  magnesium oxide (MAG-OX) 400 MG tablet Take 400 mg by mouth 2 (two)  times daily.    [provider]  polyethylene glycol powder (GAVILAX) 17 GM/SCOOP powder Take 17 g by mouth daily.    [provider]  potassium chloride SA (K-DUR,KLOR-CON) 20 MEQ tablet Take 40 mEq by mouth 2 (two) times daily.    [provider]  rasagiline (AZILECT) 0.5 MG TABS tablet Take 1 mg by mouth daily.    [provider]  Rivaroxaban 15 & 20 MG TBPK Follow package directions: Take one 15mg  tablet by mouth twice a day. On day 22, switch to one 20mg  tablet once a day. Take with food. Patient not taking: Reported on 11/23/2018 11/22/18   Fransico Meadow, PA-C  silver sulfADIAZINE (SILVADENE) 1 % cream  Apply 1 application topically daily.    [provider]  tamsulosin (FLOMAX) 0.4 MG CAPS capsule Take 0.4 mg by mouth daily.    [provider]  torsemide (DEMADEX) 20 MG tablet Take 40 mg by mouth 2 (two) times daily.     [provider]    Family History Family History  Problem Relation Age of Onset  . Colon cancer Other        Unknown family history    Social History Social History   Tobacco Use  . Smoking status: Current Every Day Smoker  . Smokeless tobacco: Never Used  Substance Use Topics  . Alcohol use: No    Alcohol/week: 0.0 standard drinks  . Drug use: No     Allergies   Penicillins   Review of Systems Review of Systems  Unable to perform ROS: Patient nonverbal  Neurological: Positive for weakness.     Physical Exam Updated Vital Signs BP 106/71 (BP Location: Right Arm)   Pulse 82   Temp 99.6 F (37.6 C) (Rectal)   Resp 16   Ht 6' (1.829 m)   Wt 85.7 kg   SpO2 100%   BMI 25.62 kg/m   Physical Exam Vitals signs and nursing note reviewed.  Constitutional:      General: He is not in acute distress.    Appearance: He is well-developed.     Comments: Chronically ill-appearing male, appears to be in no acute discomfort.  Patient is strong of urine odor.  HENT:     Head: Atraumatic.     Comments: Mouth is dry, patient is edentulous. Eyes:     Conjunctiva/sclera: Conjunctivae normal.  Neck:     Musculoskeletal: Neck supple.  Cardiovascular:     Rate and Rhythm: Normal rate and regular rhythm.     Pulses: Normal pulses.     Heart sounds: Normal heart sounds.  Pulmonary:     Effort: Pulmonary effort is normal.     Breath sounds: Normal breath sounds.  Abdominal:     Palpations: Abdomen is soft.     Tenderness: There is no abdominal tenderness.  Musculoskeletal:     Comments: Right lower extremity with hyperpigmented skin changes throughout leg with 1+ pitting edema up to the knee.  Mild calf tenderness.  Dorsalis  pedis pulse palpable.  Skin:    Findings: No rash.  Neurological:     Mental Status: He is alert.     Motor: Weakness (Global weakness all 4 extremities with equal strength) present.     Comments: Nonverbal but able to follow commands.      ED Treatments / Results  Labs (all labs ordered are listed, but only abnormal results are displayed) Labs Reviewed  CBC WITH DIFFERENTIAL/PLATELET - Abnormal; Notable for the  following components:      Result Value   WBC 16.7 (*)    Hemoglobin 12.9 (*)    RDW 15.8 (*)    Neutro Abs 14.8 (*)    Abs Immature Granulocytes 0.33 (*)    All other components within normal limits  COMPREHENSIVE METABOLIC PANEL - Abnormal; Notable for the following components:   Glucose, Bld 104 (*)    BUN 65 (*)    Creatinine, Ser 3.13 (*)    AST 100 (*)    Total Bilirubin 1.4 (*)    GFR calc non Af Amer 19 (*)    GFR calc Af Amer 22 (*)    All other components within normal limits  URINALYSIS, ROUTINE W REFLEX MICROSCOPIC - Abnormal; Notable for the following components:   APPearance CLOUDY (*)    Hgb urine dipstick LARGE (*)    Protein, ur 30 (*)    Leukocytes,Ua LARGE (*)    WBC, UA >50 (*)    Bacteria, UA FEW (*)    All other components within normal limits  LACTIC ACID, PLASMA - Abnormal; Notable for the following components:   Lactic Acid, Venous 2.6 (*)    All other components within normal limits  SARS CORONAVIRUS 2 (TAT 6-24 HRS)  URINE CULTURE  CULTURE, BLOOD (ROUTINE X 2)  CULTURE, BLOOD (ROUTINE X 2)  LACTIC ACID, PLASMA  APTT  PROTIME-INR    EKG None  ED ECG REPORT   Date: 11/28/2018  Rate: 82  Rhythm: normal sinus rhythm  QRS Axis: normal  Intervals: PR shortened  ST/T Wave abnormalities: nonspecific ST changes  Conduction Disutrbances:none  Narrative Interpretation:   Old EKG Reviewed: unchanged  I have personally reviewed the EKG tracing and agree with the computerized printout as noted.   Radiology Dg Chest Portable 1  View  Result Date: 11/28/2018 CLINICAL DATA:  Chronic swelling in the legs. EXAM: PORTABLE CHEST 1 VIEW COMPARISON:  11/23/2018 FINDINGS: The heart size and mediastinal contours are within normal limits. Both lungs are clear. The visualized skeletal structures are unremarkable. IMPRESSION: No active disease. Electronically Signed   By: Van Clines M.D.   On: 11/28/2018 14:04    Procedures .Critical Care Performed by: Domenic Moras, PA-C Authorized by: Domenic Moras, PA-C   Critical care provider statement:    Critical care time (minutes):  45   Critical care was time spent personally by me on the following activities:  Discussions with consultants, evaluation of patient's response to treatment, examination of patient, ordering and performing treatments and interventions, ordering and review of laboratory studies, ordering and review of radiographic studies, pulse oximetry, re-evaluation of patient's condition, obtaining history from patient or surrogate and review of old charts   (including critical care time)  Medications Ordered in ED Medications  cefTRIAXone (ROCEPHIN) 1 g in sodium chloride 0.9 % 100 mL IVPB (has no administration in time range)  sodium chloride 0.9 % bolus 1,000 mL (has no administration in time range)  sodium chloride 0.9 % bolus 1,000 mL (0 mLs Intravenous Stopped 11/28/18 1512)     Initial Impression / Assessment and Plan / ED Course  I have reviewed the triage vital signs and the nursing notes.  Pertinent labs & imaging results that were available during my care of the patient were reviewed by me and considered in my medical decision making (see chart for details).        BP 106/71 (BP Location: Right Arm)   Pulse 82   Temp 99.6  F (37.6 C) (Rectal)   Resp 16   Ht 6' (1.829 m)   Wt 85.7 kg   SpO2 100%   BMI 25.62 kg/m    Final Clinical Impressions(s) / ED Diagnoses   Final diagnoses:  Lower urinary tract infectious disease  AKI (acute  kidney injury) ([facility])  Generalized weakness    ED Discharge Orders    None     1:23 PM Patient with significant history of mental retardation as well as Parkinson disease here for generalized weakness.  He has been seen in the ED several times within the past month for the same complaint last visit was approximately 4 days ago.  He has a known right lower extremity DVT currently on Xarelto.  Have discussed patient care with his caregiver at the facility and he was reported that patient is having trouble with ambulation as well as having decrease in appetite.  Work-up initiated, IV fluid given.  2:48 PM UA with finding concerning for urinary tract infection.  Urine culture sent.  Elevated white count of 16.7.  Evidence of AKI with a creatinine of 3.13 and BUN of 65 likely reflecting dehydration.  Chest x-ray unremarkable.  COVID-19 test sent.  Will consult for admission.  3:24 PM Elevated lactic acid of 2.6, likely combination dehydration vs. potential sepsis.  Temperature is 99.6 rectally, blood pressure is soft at 106/71.  IV fluid given for fluid resuscitation.  Sepsis reassessment done.  3:35 PM Appreciate consultation from [facility] Dr. Veverly Fells who agrees to see and admit pt for further care.   [name] was evaluated in Emergency Department on 11/28/2018 for the symptoms described in the history of present illness. He was evaluated in the context of the global COVID-19 pandemic, which necessitated consideration that the patient might be at risk for infection with the SARS-CoV-2 virus that causes COVID-19. Institutional protocols and algorithms that pertain to the evaluation of patients at risk for COVID-19 are in a state of rapid change based on information released by regulatory bodies including the CDC and federal and state organizations. These policies and algorithms were followed during the patient's care in the ED.    Domenic Moras, PA-C 11/28/18 1536    Milton Ferguson, MD 11/29/18 [phone]

## 2018-11-29 LAB — BLOOD CULTURE ID PANEL (REFLEXED)

## 2018-11-29 LAB — CBC
HCT: 39.8 % (ref 39.0–52.0)
Hemoglobin: 11.9 g/dL — ABNORMAL LOW (ref 13.0–17.0)
MCH: 26.9 pg (ref 26.0–34.0)
MCHC: 29.9 g/dL — ABNORMAL LOW (ref 30.0–36.0)
MCV: 89.8 fL (ref 80.0–100.0)
Platelets: 180 10*3/uL (ref 150–400)
RBC: 4.43 MIL/uL (ref 4.22–5.81)
RDW: 16 % — ABNORMAL HIGH (ref 11.5–15.5)
WBC: 12.9 10*3/uL — ABNORMAL HIGH (ref 4.0–10.5)
nRBC: 0.2 % (ref 0.0–0.2)

## 2018-11-29 LAB — BASIC METABOLIC PANEL
Anion gap: 13 (ref 5–15)
BUN: 56 mg/dL — ABNORMAL HIGH (ref 8–23)
CO2: 27 mmol/L (ref 22–32)
Calcium: 8.4 mg/dL — ABNORMAL LOW (ref 8.9–10.3)
Chloride: 104 mmol/L (ref 98–111)
Creatinine, Ser: 2.62 mg/dL — ABNORMAL HIGH (ref 0.61–1.24)
GFR calc Af Amer: 28 mL/min — ABNORMAL LOW (ref >60)
GFR calc non Af Amer: 24 mL/min — ABNORMAL LOW (ref >60)
Glucose, Bld: 112 mg/dL — ABNORMAL HIGH (ref 70–99)
Potassium: 3.5 mmol/L (ref 3.5–5.1)
Sodium: 144 mmol/L (ref 135–145)

## 2018-11-29 LAB — SARS CORONAVIRUS 2 (TAT 6-24 HRS): SARS Coronavirus 2: NEGATIVE

## 2018-11-29 MED ORDER — POLYETHYLENE GLYCOL 3350 17 G PO PACK
17.0000 g | PACK | Freq: Every day | ORAL | Status: DC
  Administered 2018-11-29 – 2018-12-04 (×4): 17 g via ORAL
  Filled 2018-11-29 (×4): qty 1

## 2018-11-29 MED ORDER — SODIUM CHLORIDE 0.9 % IV SOLN
1.0000 g | Freq: Two times a day (BID) | INTRAVENOUS | Status: DC
  Administered 2018-11-29 – 2018-12-01 (×5): 1 g via INTRAVENOUS
  Filled 2018-11-29 (×5): qty 1

## 2018-11-29 MED ORDER — CLONAZEPAM 0.25 MG PO TBDP
0.2500 mg | ORAL_TABLET | Freq: Every day | ORAL | Status: DC
  Administered 2018-11-29 – 2018-12-04 (×6): 0.25 mg via ORAL
  Filled 2018-11-29 (×6): qty 1

## 2018-11-29 MED ORDER — APIXABAN 5 MG PO TABS
5.0000 mg | ORAL_TABLET | Freq: Two times a day (BID) | ORAL | Status: DC
  Administered 2018-11-29 – 2018-11-30 (×3): 5 mg via ORAL
  Filled 2018-11-29 (×3): qty 1

## 2018-11-29 NOTE — Progress Notes (Signed)
CRITICAL VALUE ALERT  Critical Value:  Gram negative rods present in anaerobic bottle  Date & Time Notied: 11/29/18 at [place]  Provider Notified: Dwyane Dee   Orders Received/Actions taken: No new orders.   Will continue to monitor.

## 2018-11-29 NOTE — Plan of Care (Signed)
  Problem: Acute Rehab PT Goals(only PT should resolve) Goal: Pt Will Go Supine/Side To Sit Outcome: Progressing Flowsheets (Taken 11/29/2018 1437) Pt will go Supine/Side to Sit: with supervision Goal: Patient Will Transfer Sit To/From Stand Outcome: Progressing Flowsheets (Taken 11/29/2018 1437) Patient will transfer sit to/from stand: with min guard assist Goal: Pt Will Transfer Bed To Chair/Chair To Bed Outcome: Progressing Flowsheets (Taken 11/29/2018 1437) Pt will Transfer Bed to Chair/Chair to Bed: min guard assist Goal: Pt Will Ambulate Outcome: Progressing Flowsheets (Taken 11/29/2018 1437) Pt will Ambulate:  25 feet  with min guard assist Note: Hand held assist   2:38 PM, 11/29/18 Lonell Grandchild, MPT Physical Therapist with [facility] 336 [phone] office [phone] mobile phone

## 2018-11-29 NOTE — Progress Notes (Signed)
PHARMACY - PHYSICIAN COMMUNICATION CRITICAL VALUE ALERT - BLOOD CULTURE IDENTIFICATION (BCID)  [name] is an 68 y.o. male who presented to [facility] on 11/28/2018 with a chief complaint of weakness  Assessment:  BCID results show e. coli  Name of physician (or Provider) Contacted: Dr. Denton Brick  Current antibiotics: Cipro 500 mg PO daily  Changes to prescribed antibiotics recommended: Meropenem 1000 mg IV every 12 hours due to increased risk of ESBL at [facility] Recommendations accepted by provider  Results for orders placed or performed during the hospital encounter of 11/28/18  Blood Culture ID Panel (Reflexed) (Collected: 11/28/2018  3:50 PM)  Result Value Ref Range   Enterococcus species NOT DETECTED NOT DETECTED   Listeria monocytogenes NOT DETECTED NOT DETECTED   Staphylococcus species NOT DETECTED NOT DETECTED   Staphylococcus aureus (BCID) NOT DETECTED NOT DETECTED   Streptococcus species NOT DETECTED NOT DETECTED   Streptococcus agalactiae NOT DETECTED NOT DETECTED   Streptococcus pneumoniae NOT DETECTED NOT DETECTED   Streptococcus pyogenes NOT DETECTED NOT DETECTED   Acinetobacter baumannii NOT DETECTED NOT DETECTED   Enterobacteriaceae species DETECTED (A) NOT DETECTED   Enterobacter cloacae complex NOT DETECTED NOT DETECTED   Escherichia coli DETECTED (A) NOT DETECTED   Klebsiella oxytoca NOT DETECTED NOT DETECTED   Klebsiella pneumoniae NOT DETECTED NOT DETECTED   Proteus species NOT DETECTED NOT DETECTED   Serratia marcescens NOT DETECTED NOT DETECTED   Carbapenem resistance NOT DETECTED NOT DETECTED   Haemophilus influenzae NOT DETECTED NOT DETECTED   Neisseria meningitidis NOT DETECTED NOT DETECTED   Pseudomonas aeruginosa NOT DETECTED NOT DETECTED   Candida albicans NOT DETECTED NOT DETECTED   Candida glabrata NOT DETECTED NOT DETECTED   Candida krusei NOT DETECTED NOT DETECTED   Candida parapsilosis NOT DETECTED NOT DETECTED   Candida tropicalis  NOT DETECTED NOT DETECTED    Ramond Craver 11/29/2018  8:25 AM

## 2018-11-29 NOTE — Progress Notes (Signed)
Patient Demographics:    [name], is a 68 y.o. male, DOB - Dec 31, 1950, TMY:[id number]  Admit date - 11/28/2018   Admitting Physician Neena Rhymes, MD  Outpatient Primary MD for the patient is Rosita Fire, MD  LOS - 1   Chief Complaint  Patient presents with   Weakness        Subjective:    [name] today has no fevers, no emesis,  No chest pain, intermittently confused,  Assessment  & Plan :    Active Problems:   Schizophrenia ([place])   Dementia ([place])   Sepsis secondary to UTI [facility])   DVT (deep venous thrombosis) (H[facility])   AKI (acute kidney injury) ([place])   1) E. coli bacteremia and presumed E. coli UTI and sepsis--okay to continue IV meropenem until ESBL is ruled out,   2) DVT - patient with DVT by LE venous u/s 9/28 right posterior tibial vein with thrombophlebitis right great saphenous vein. Was unable to afford Xarelto and went unprotected for several days. Leg remains tender.  -V/Q negative, currently on Lovenox, social worker to help with cost of Eliquis otherwise patient will need to be discharged on Coumadin   3)AKI----acute kidney injury due to presumed UTI and dehydration    creatinine on admission=3.1  ,   baseline creatinine = 1.2 (10/14/2018)   , creatinine is now= 2.63     , renally adjust medications, avoid nephrotoxic agents / dehydration  / hypotension  4.  Dementia/depressive disorder-okay to continue buspirone, clonazepam, Clozaril, on Aricept  5. Parkinson's -stable, continue Sinemet  6. Dispo - coming from [place] - long term resident. PT eval appreciated recommends home health  Disposition/Need for in-Hospital Stay- patient unable to be discharged at this time due to requiring IV antibiotics for E. coli and gram-negative rod UTI and bacteremia and sepsis pending final ID and sensitivity*  Code Status : full  Family Communication:   NA (patient  is alert, awake and coherent)   Disposition Plan  : hh  Consults  :  na  DVT Prophylaxis  :  Lovenox transitioned to Eliquis  Lab Results  Component Value Date   PLT 180 11/29/2018    Inpatient Medications  Scheduled Meds:  apixaban  5 mg Oral BID   busPIRone  15 mg Oral BID   carbidopa-levodopa  2 tablet Oral QID   chlorhexidine  15 mL Mouth Rinse BID   clonazepam  0.25 mg Oral Daily   cloZAPine  75 mg Oral QHS   docusate sodium  100 mg Oral BID   donepezil  10 mg Oral Daily   mouth rinse  15 mL Mouth Rinse q12n4p   polyethylene glycol  17 g Oral Daily   potassium chloride SA  40 mEq Oral BID   rasagiline  1 mg Oral Daily   tamsulosin  0.4 mg Oral Daily   torsemide  40 mg Oral BID   Continuous Infusions:  dextrose 5 % and 0.45% NaCl 75 mL/hr at 11/29/18 0836   meropenem (MERREM) IV 200 mL/hr at 11/29/18 1600   PRN Meds:.acetaminophen    Anti-infectives (From admission, onward)   Start     Dose/Rate Route Frequency Ordered Stop   11/29/18 1000  meropenem (MERREM) 1 g  in sodium chloride 0.9 % 100 mL IVPB     1 g 200 mL/hr over 30 Minutes Intravenous Every 12 hours 11/29/18 0824     11/28/18 1830  ciprofloxacin (CIPRO) tablet 500 mg  Status:  Discontinued     500 mg Oral Daily-1800 11/28/18 1746 11/29/18 0824   11/28/18 1500  cefTRIAXone (ROCEPHIN) 1 g in sodium chloride 0.9 % 100 mL IVPB     1 g 200 mL/hr over 30 Minutes Intravenous  Once 11/28/18 1447 11/28/18 1639        Objective:   Vitals:   11/29/18 0514 11/29/18 0536 11/29/18 0600 11/29/18 1400  BP: 99/64 101/75  111/70  Pulse: 71 82  71  Resp: 17 18  18   Temp: 97.7 F (36.5 C)     TempSrc: Oral     SpO2: 99% (!) 81% 97% 94%  Weight:      Height:        Wt Readings from Last 3 Encounters:  11/28/18 85.7 kg  11/23/18 85.7 kg  11/22/18 85.7 kg     Intake/Output Summary (Last 24 hours) at 11/29/2018 1912 Last data filed at 11/29/2018 1700 Gross per 24 hour  Intake 2577.26  ml  Output 2500 ml  Net 77.26 ml     Physical Exam  Gen:- Awake Alert,  In no apparent distress  HEENT:- [place].AT, No sclera icterus Neck-Supple Neck,No JVD,.  Lungs-  CTAB , fair symmetrical air movement CV- S1, S2 normal, regular  Abd-  +ve B.Sounds, Abd Soft, No tenderness,    Extremity/Skin:-Bilateral lower extremity swelling right more than left, pedal pulses present Psych-affect is appropriate, oriented x3 (episodes of confusion and disorientation) Neuro-generalized weakness no new focal deficits, no tremors   Data Review:   Micro Results Recent Results (from the past 240 hour(s))  Urine Culture     Status: Abnormal (Preliminary result)   Collection Time: 11/28/18  1:12 PM   Specimen: Urine, Clean Catch  Result Value Ref Range Status   Specimen Description   Final    URINE, CLEAN CATCH Performed at [facility], [address], [place], [place] [address]    Special Requests   Final    NONE Performed at [facility], [address] [place], [place] [address]    Culture (A)  Final    >=100,000 COLONIES/mL GRAM NEGATIVE RODS IDENTIFICATION AND SUSCEPTIBILITIES TO FOLLOW CULTURE REINCUBATED FOR BETTER GROWTH Performed at [place] [address] [place], [place] [address]    Report Status PENDING  Incomplete  SARS CORONAVIRUS 2 (TAT 6-24 HRS) Nasopharyngeal Nasopharyngeal Swab     Status: None   Collection Time: 11/28/18  1:40 PM   Specimen: Nasopharyngeal Swab  Result Value Ref Range Status   SARS Coronavirus 2 NEGATIVE NEGATIVE Final    Comment: (NOTE) SARS-CoV-2 target nucleic acids are NOT DETECTED. The SARS-CoV-2 RNA is generally detectable in upper and lower respiratory specimens during the acute phase of infection. Negative results do not preclude SARS-CoV-2 infection, do not rule out co-infections with other pathogens, and should not be used as the sole basis for treatment or other patient management decisions. Negative results must be combined  with clinical observations, patient history, and epidemiological information. The expected result is Negative. Fact Sheet for Patients: SugarRoll.be Fact Sheet for Healthcare Providers: https://www.woods-mathews.com/ This test is not yet approved or cleared by the [place] FDA and  has been authorized for detection and/or diagnosis of SARS-CoV-2 by FDA under an Emergency Use Authorization (  EUA). This EUA will remain  in effect (meaning this test can be used) for the duration of the COVID-19 declaration under Section 56 4(b)(1) of the Act, 21 U.S.C. section 360bbb-3(b)(1), unless the authorization is terminated or revoked sooner. Performed at [facility], [place] [address], [place], [place] [address]   Blood Culture (routine x 2)     Status: None (Preliminary result)   Collection Time: 11/28/18  3:50 PM   Specimen: BLOOD LEFT HAND  Result Value Ref Range Status   Specimen Description   Final    BLOOD LEFT HAND Performed at [facility], [address], [place], [place] [address]    Special Requests   Final    BOTTLES DRAWN AEROBIC AND ANAEROBIC Blood Culture adequate volume Performed at [facility], [address] [place], [place] [address]    Culture  Setup Time   Final    GRAM NEGATIVE RODS STYPA,N @ 1941 ON 11/29/18 BY JUW GS DONE @ [facility] IN BOTH AEROBIC AND ANAEROBIC BOTTLES    Culture GRAM NEGATIVE RODS  Final   Report Status PENDING  Incomplete  Blood Culture ID Panel (Reflexed)     Status: Abnormal   Collection Time: 11/28/18  3:50 PM  Result Value Ref Range Status   Enterococcus species NOT DETECTED NOT DETECTED Final   Listeria monocytogenes NOT DETECTED NOT DETECTED Final   Staphylococcus species NOT DETECTED NOT DETECTED Final   Staphylococcus aureus (BCID) NOT DETECTED NOT DETECTED Final   Streptococcus species NOT DETECTED NOT DETECTED Final   Streptococcus agalactiae NOT DETECTED NOT DETECTED Final    Streptococcus pneumoniae NOT DETECTED NOT DETECTED Final   Streptococcus pyogenes NOT DETECTED NOT DETECTED Final   Acinetobacter baumannii NOT DETECTED NOT DETECTED Final   Enterobacteriaceae species DETECTED (A) NOT DETECTED Final    Comment: Enterobacteriaceae represent a large family of gram-negative bacteria, not a single organism. CRITICAL RESULT CALLED TO, READ BACK BY AND VERIFIED WITH: S. [place], AT [phone] 11/29/18 BY D. VANHOOK    Enterobacter cloacae complex NOT DETECTED NOT DETECTED Final   Escherichia coli DETECTED (A) NOT DETECTED Final    Comment: CRITICAL RESULT CALLED TO, READ BACK BY AND VERIFIED WITH: Gloris Manchester PHARMD, AT 1448 11/29/18 BY D. VANHOOK    Klebsiella oxytoca NOT DETECTED NOT DETECTED Final   Klebsiella pneumoniae NOT DETECTED NOT DETECTED Final   Proteus species NOT DETECTED NOT DETECTED Final   Serratia marcescens NOT DETECTED NOT DETECTED Final   Carbapenem resistance NOT DETECTED NOT DETECTED Final   Haemophilus influenzae NOT DETECTED NOT DETECTED Final   Neisseria meningitidis NOT DETECTED NOT DETECTED Final   Pseudomonas aeruginosa NOT DETECTED NOT DETECTED Final   Candida albicans NOT DETECTED NOT DETECTED Final   Candida glabrata NOT DETECTED NOT DETECTED Final   Candida krusei NOT DETECTED NOT DETECTED Final   Candida parapsilosis NOT DETECTED NOT DETECTED Final   Candida tropicalis NOT DETECTED NOT DETECTED Final    Comment: Performed at [facility], [place] [address], [place], [place] [address]  Blood Culture (routine x 2)     Status: None (Preliminary result)   Collection Time: 11/28/18  3:59 PM   Specimen: BLOOD RIGHT ARM  Result Value Ref Range Status   Specimen Description BLOOD RIGHT ARM  Final   Special Requests   Final    BOTTLES DRAWN AEROBIC AND ANAEROBIC Blood Culture adequate volume   Culture   Final    NO GROWTH < 24 HOURS Performed  at [facility], [address], [place], [place] [address]    Report Status PENDING   Incomplete    Radiology Reports Dg Chest 2 View  Result Date: 11/23/2018 CLINICAL DATA:  Weakness. EXAM: CHEST - 2 VIEW COMPARISON:  Portable chest 10/13/2018. FINDINGS: Mediastinum and hilar structures normal. Stable elevation left hemidiaphragm with mild left base atelectasis. No acute infiltrate. No pleural effusion or pneumothorax. IMPRESSION: Stable elevation left hemidiaphragm with mild atelectasis left lung base. No acute abnormality identified. Electronically Signed   By: Marcello Moores  Register   On: 11/23/2018 14:01   Ct Head Wo Contrast  Result Date: 11/23/2018 CLINICAL DATA:  Altered mental status EXAM: CT HEAD WITHOUT CONTRAST TECHNIQUE: Contiguous axial images were obtained from the base of the skull through the vertex without intravenous contrast. COMPARISON:  None. FINDINGS: Brain: No evidence of acute infarction, hemorrhage, hydrocephalus, extra-axial collection or mass lesion/mass effect. Scattered low-density changes within the periventricular and subcortical white matter compatible with chronic microvascular ischemic change. Mild diffuse cerebral volume loss. Vascular: Mild atherosclerotic calcifications involving the large vessels of the skull base. No unexpected hyperdense vessel. Skull: Normal. Negative for fracture or focal lesion. Sinuses/Orbits: No acute finding. Other: None. IMPRESSION: No acute intracranial findings. Electronically Signed   By: Davina Poke M.D.   On: 11/23/2018 16:13   Nm Pulmonary Perfusion  Result Date: 11/28/2018 CLINICAL DATA:  PE suspected EXAM: NUCLEAR MEDICINE PERFUSION LUNG SCAN TECHNIQUE: Perfusion images were obtained in multiple projections after intravenous injection of radiopharmaceutical. Ventilation scans intentionally deferred if perfusion scan and chest x-ray adequate for interpretation during COVID 19 epidemic. RADIOPHARMACEUTICALS:  1.6 mCi Tc-21m MAA IV COMPARISON:  Same day chest radiograph FINDINGS: Normal, homogeneous pulmonary perfusion  on diffusion only imaging. No suspicious perfusion defect. IMPRESSION: Normal pulmonary perfusion scan.  No evidence of pulmonary embolism. Electronically Signed   By: Eddie Candle M.D.   On: 11/28/2018 19:53   US Venous Img Lower Unilateral Right  Result Date: 11/22/2018 CLINICAL DATA:  Lower extremity edema and pain. EXAM: RIGHT LOWER EXTREMITY VENOUS DOPPLER ULTRASOUND TECHNIQUE: Gray-scale sonography with graded compression, as well as color Doppler and duplex ultrasound were performed to evaluate the lower extremity deep venous systems from the level of the common femoral vein and including the common femoral, femoral, profunda femoral, popliteal and calf veins including the posterior tibial, peroneal and gastrocnemius veins when visible. The superficial great saphenous vein was also interrogated. Spectral Doppler was utilized to evaluate flow at rest and with distal augmentation maneuvers in the common femoral, femoral and popliteal veins. COMPARISON:  September 13, 2011.  October 13, 2018. FINDINGS: Contralateral Common Femoral Vein: Respiratory phasicity is normal and symmetric with the symptomatic side. No evidence of thrombus. Normal compressibility. Common Femoral Vein: No evidence of thrombus. Normal compressibility, respiratory phasicity and response to augmentation. Saphenofemoral Junction: No evidence of thrombus. Normal compressibility and flow on color Doppler imaging. Profunda Femoral Vein: No evidence of thrombus. Normal compressibility and flow on color Doppler imaging. Femoral Vein: No evidence of thrombus. Normal compressibility, respiratory phasicity and response to augmentation. Popliteal Vein: No evidence of thrombus. Normal compressibility, respiratory phasicity and response to augmentation. Calf Veins: There is nonocclusive thrombus in the posterior tibial vein. Superficial Great Saphenous Vein: There is thrombus within the great saphenous vein. Other Findings:  Nonspecific lower extremity  edema is noted. IMPRESSION: 1. Study is positive for a nonocclusive DVT involving the posterior tibial vein on the right. 2. Superficial thrombophlebitis involving the distal great saphenous vein. 3. Lower  extremity edema. Electronically Signed   By: Constance Holster M.D.   On: 11/22/2018 16:42   Dg Chest Portable 1 View  Result Date: 11/28/2018 CLINICAL DATA:  Chronic swelling in the legs. EXAM: PORTABLE CHEST 1 VIEW COMPARISON:  11/23/2018 FINDINGS: The heart size and mediastinal contours are within normal limits. Both lungs are clear. The visualized skeletal structures are unremarkable. IMPRESSION: No active disease. Electronically Signed   By: Van Clines M.D.   On: 11/28/2018 14:04     CBC Recent Labs  Lab 11/23/18 1338 11/28/18 1312 11/29/18 0630  WBC 9.9 16.7* 12.9*  HGB 12.2* 12.9* 11.9*  HCT 41.1 42.1 39.8  PLT 222 179 180  MCV 92.2 88.4 89.8  MCH 27.4 27.1 26.9  MCHC 29.7* 30.6 29.9*  RDW 15.4 15.8* 16.0*  LYMPHSABS 2.3 0.7  --   MONOABS 0.6 0.9  --   EOSABS 0.0 0.0  --   BASOSABS 0.0 0.0  --     Chemistries  Recent Labs  Lab 11/23/18 1338 11/28/18 1312 11/29/18 0630  NA 139 140 144  K 3.2* 4.6 3.5  CL 100 102 104  CO2 31 25 27   GLUCOSE 125* 104* 112*  BUN 25* 65* 56*  CREATININE 1.50* 3.13* 2.62*  CALCIUM 8.7* 9.2 8.4*  AST 19 100*  --   ALT 13 7  --   ALKPHOS 56 84  --   BILITOT 1.0 1.4*  --    ------------------------------------------------------------------------------------------------------------------ No results for input(s): CHOL, HDL, LDLCALC, TRIG, CHOLHDL, LDLDIRECT in the last 72 hours.  No results found for: HGBA1C ------------------------------------------------------------------------------------------------------------------ No results for input(s): TSH, T4TOTAL, T3FREE, THYROIDAB in the last 72 hours.  Invalid input(s):  FREET3 ------------------------------------------------------------------------------------------------------------------ No results for input(s): VITAMINB12, FOLATE, FERRITIN, TIBC, IRON, RETICCTPCT in the last 72 hours.  Coagulation profile Recent Labs  Lab 11/28/18 1312  INR 1.2    Recent Labs    11/28/18 1312  DDIMER 7.21*    Cardiac Enzymes No results for input(s): CKMB, TROPONINI, MYOGLOBIN in the last 168 hours.  Invalid input(s): CK ------------------------------------------------------------------------------------------------------------------    Component Value Date/Time   BNP 14.0 11/22/2018 1427     Lezli Danek M.D on 11/29/2018 at 7:12 PM  Go to [email or web address] - for contact info  [facility] - Office  [phone]

## 2018-11-29 NOTE — Evaluation (Signed)
Physical Therapy Evaluation Patient Details Name: [name] MRN: [id number] DOB: 11-20-1950 Today's Date: 11/29/2018   History of Present Illness  68 year old male with history of dementia, Parkinson's, schizophrenia, mental retardation, recently diagnosed with right lower extremity DVT on 11/22/2018. He is a resident at [facility] brought here via EMS for evaluation of weakness.  Patient is a poor historian.  From prior note from 4 days ago when patient was seen in the ED 9/28 and 11/23/2018. Patient has a known right lower extremity DVT and was  having trouble obtaining his Xarelto anticoagulant medication going unprotected for several days.  He also has generalized weakness and recurrent falls.  He was evaluated and it was felt that his difficulty walking is due to his right lower extremity DVT.  He received IV fluid and was able to tolerate p.o. and subsequently discharged home with social work and case management involvement to help with Xarelto.  Today I was able to talk to patient's caregiver at the facility via the phone.  Patient did show some mild improvement when he was discharged from the ER however within the past 2 days he has been not himself.  He is weak, unable to get up without assistance, unable to ambulate or care for himself.  He slumped over and is not eating which is unusual for him.  He does not specifically complain of anything but due to his progressive decline in his strength and mental status, staff felt patient will need to evaluate further and arranged for transport to [facility]    Clinical Impression  Patient very difficulty to understand, but follows directions with verbal/tactile cueing, and able to express needs such as going to bathroom.  Patient demonstrates labored movement for standing, would not use RW for taking steps, had to lean on walls nearby objects for support, transferred to commode in bathroom and had to bring his small bag with personal  belongings with him everywhere. Patient tolerated sitting up in chair after therapy - NT notified.  Patient will benefit from continued physical therapy in hospital and recommended venue below to increase strength, balance, endurance for safe ADLs and gait.    Follow Up Recommendations Home health PT;Supervision for mobility/OOB;Supervision/Assistance - 24 hour    Equipment Recommendations  None recommended by PT    Recommendations for Other Services       Precautions / Restrictions Precautions Precautions: Fall Restrictions Weight Bearing Restrictions: No      Mobility  Bed Mobility Overal bed mobility: Needs Assistance Bed Mobility: Supine to Sit     Supine to sit: Min assist     General bed mobility comments: slow labored movement  Transfers Overall transfer level: Needs assistance Equipment used: 1 person hand held assist Transfers: Sit to/from Stand;Stand Pivot Transfers Sit to Stand: Min assist Stand pivot transfers: Min assist       General transfer comment: poor carryover for attempting use of RW, patient preferred not to use it  Ambulation/Gait Ambulation/Gait assistance: Min assist Gait Distance (Feet): 12 Feet Assistive device: None Gait Pattern/deviations: Decreased step length - right;Decreased stride length;Narrow base of support Gait velocity: decreased   General Gait Details: slow labored cadence with narrow base of support and short step/stride length frequent leaning on nearby objects for support, limited secondary to fatigue  Stairs            Wheelchair Mobility    Modified Rankin (Stroke Patients Only)       Balance Overall balance assessment: Needs  assistance Sitting-balance support: Feet supported;No upper extremity supported Sitting balance-Leahy Scale: Fair Sitting balance - Comments: fair/good for sitting up at bedside   Standing balance support: During functional activity;No upper extremity supported Standing  balance-Leahy Scale: Poor Standing balance comment: fair/poor with hand held assist                             Pertinent Vitals/Pain Pain Assessment: Faces Faces Pain Scale: No hurt    Home Living Family/patient expects to be discharged to:: Group home                      Prior Function Level of Independence: Needs assistance   Gait / Transfers Assistance Needed: household ambulator  ADL's / Homemaking Assistance Needed: assisted by group home staff        Hand Dominance        Extremity/Trunk Assessment   Upper Extremity Assessment Upper Extremity Assessment: Generalized weakness    Lower Extremity Assessment Lower Extremity Assessment: Generalized weakness    Cervical / Trunk Assessment Cervical / Trunk Assessment: Normal  Communication   Communication: Expressive difficulties  Cognition Arousal/Alertness: Awake/alert Behavior During Therapy: Impulsive Overall Cognitive Status: History of cognitive impairments - at baseline                                        General Comments      Exercises     Assessment/Plan    PT Assessment Patient needs continued PT services  PT Problem List Decreased strength;Decreased activity tolerance;Decreased balance;Decreased mobility       PT Treatment Interventions Balance training;Gait training;Functional mobility training;Therapeutic activities;Stair training;Therapeutic exercise;Patient/family education    PT Goals (Current goals can be found in the Care Plan section)  Acute Rehab PT Goals Patient Stated Goal: return home PT Goal Formulation: With patient Time For Goal Achievement: 12/06/18 Potential to Achieve Goals: Good    Frequency Min 3X/week   Barriers to discharge        Co-evaluation               AM-PAC PT "6 Clicks" Mobility  Outcome Measure Help needed turning from your back to your side while in a flat bed without using bedrails?: A Little Help  needed moving from lying on your back to sitting on the side of a flat bed without using bedrails?: A Little Help needed moving to and from a bed to a chair (including a wheelchair)?: A Little Help needed standing up from a chair using your arms (e.g., wheelchair or bedside chair)?: A Little Help needed to walk in hospital room?: A Lot Help needed climbing 3-5 steps with a railing? : A Lot 6 Click Score: 16    End of Session   Activity Tolerance: Patient tolerated treatment well;Patient limited by fatigue Patient left: in chair;with call bell/phone within reach;with chair alarm set Nurse Communication: Mobility status PT Visit Diagnosis: Unsteadiness on feet (R26.81);Other abnormalities of gait and mobility (R26.89);Muscle weakness (generalized) (M62.81)    Time: 1660-6301 PT Time Calculation (min) (ACUTE ONLY): 41 min   Charges:   PT Evaluation $PT Eval High Complexity: 1 High PT Treatments $Therapeutic Activity: 23-37 mins        2:33 PM, 11/29/18 Lonell Grandchild, MPT Physical Therapist with [facility] 336 [phone] office [phone] mobile phone

## 2018-11-29 NOTE — TOC Initial Note (Signed)
Transition of Care [facility]) - Initial/Assessment Note    Patient Details  Name: [name] MRN: [id number] Date of Birth: 11-Jun-1950  Transition of Care [facility]) CM/SW Contact:    Aylen Stradford, Chauncey Reading, RN Phone Number: 11/29/2018, 2:51 PM  Clinical Narrative:    Patient UTI. From [facility]. Call to [facility], left voicemail asking for return call, only name on chart. Patient only oriented to person.              Expected Discharge Plan: [facility](with home health) Barriers to Discharge: Continued Medical Work up    Expected Discharge Plan and Services Expected Discharge Plan: [facility](with home health)   Discharge Planning Services: CM Consult Post Acute Care Choice: [place] arrangements for the past 2 months: Group Home                   Prior Living Arrangements/Services Living arrangements for the past 2 months: Group Home                     Activities of Daily Living Home Assistive Devices/Equipment: None ADL Screening (condition at time of admission) Patient's cognitive ability adequate to safely complete daily activities?: Yes Is the patient deaf or have difficulty hearing?: No Does the patient have difficulty seeing, even when wearing glasses/contacts?: No Does the patient have difficulty concentrating, remembering, or making decisions?: Yes Patient able to express need for assistance with ADLs?: Yes Does the patient have difficulty dressing or bathing?: Yes Independently performs ADLs?: No Communication: Independent Dressing (OT): Needs assistance Grooming: Needs assistance Feeding: Independent Bathing: Needs assistance Toileting: Independent In/Out Bed: Needs assistance Walks in Home: Needs assistance Does the patient have difficulty walking or climbing stairs?: Yes Weakness of Legs: Both Weakness of Arms/Hands: None  Permission Sought/Granted                  Emotional Assessment               Admission diagnosis:  Lower urinary tract infectious disease [N39.0] Generalized weakness [R53.1] AKI (acute kidney injury) ([place]) [N17.9] Patient Active Problem List   Diagnosis Date Noted  . Sepsis secondary to UTI ([place]) 11/28/2018  . DVT (deep venous thrombosis) ([place]) 11/28/2018  . AKI (acute kidney injury) ([place]) 11/28/2018  . Cellulitis 10/13/2018  . Schizophrenia ([employer]) 10/13/2018  . Parkinson disease ([place]) 10/13/2018  . Dementia ([place]) 10/13/2018  . Encounter for screening colonoscopy 01/30/2015   PCP:  Rosita Fire, MD Pharmacy:   [facility], [address] [place] [address] Phone: [phone] Fax: [phone]     Social Determinants of Health (SDOH) Interventions    Readmission Risk Interventions No flowsheet data found.

## 2018-11-30 DIAGNOSIS — F0390 Unspecified dementia without behavioral disturbance: Secondary | ICD-10-CM

## 2018-11-30 LAB — URINE CULTURE: Culture: >=100000 — AB

## 2018-11-30 LAB — PROTIME-INR
INR: 1.4 — ABNORMAL HIGH (ref 0.8–1.2)
Prothrombin Time: 17 seconds — ABNORMAL HIGH (ref 11.4–15.2)

## 2018-11-30 MED ORDER — WARFARIN SODIUM 5 MG PO TABS
5.0000 mg | ORAL_TABLET | Freq: Once | ORAL | Status: AC
  Administered 2018-11-30: 15:00:00 5 mg via ORAL
  Filled 2018-11-30: qty 1

## 2018-11-30 MED ORDER — WARFARIN - PHARMACIST DOSING INPATIENT
Freq: Every day | Status: DC
  Administered 2018-11-30 – 2018-12-03 (×2)

## 2018-11-30 MED ORDER — ENOXAPARIN SODIUM 100 MG/ML ~~LOC~~ SOLN
90.0000 mg | SUBCUTANEOUS | Status: DC
  Administered 2018-11-30: 90 mg via SUBCUTANEOUS
  Filled 2018-11-30: qty 1

## 2018-11-30 NOTE — TOC Progression Note (Signed)
Transition of Care [facility]) - Progression Note    Patient Details  Name: UBALDO DAYWALT MRN: [id number] Date of Birth: 07-20-1950  Transition of [facility]) CM/SW Contact  Lanette Ell, Chauncey Reading, RN Phone Number: 11/30/2018, 1:47 PM  Clinical Narrative:   Discussed with [name]. Patient no longer has Medicaid. Mrs. Huel Cote kept patient as a client as he did not have a lot of expenses. However, patient has recently been started on Xarelto. Patient can not afford Xarelto, he has already used a 30 day free coupon.   Discussed with attending, patient will need to DC on coumadin. We will order [facility] RN and [facility] elects [facility].   Will arrange appointment coumadin clinic.     Expected Discharge Plan: [facility](with home health) Barriers to Discharge: Continued Medical Work up  Expected Discharge Plan and Services Expected Discharge Plan: [facility](with home health)   Discharge Planning Services: CM Consult Post Acute Care Choice: [place] arrangements for the past 2 months: Group Home                       Social Determinants of Health (SDOH) Interventions    Readmission Risk Interventions No flowsheet data found.

## 2018-11-30 NOTE — Progress Notes (Signed)
Patient Demographics:    [name], is a 68 y.o. male, DOB - 1950/06/29, JOA:[id number]  Admit date - 11/28/2018   Admitting Physician Neena Rhymes, MD  Outpatient Primary MD for the patient is Rosita Fire, MD  LOS - 2   Chief Complaint  Patient presents with   Weakness        Subjective:    [name] today has no fevers, no emesis,  No chest pain, no agitation, attempted confusion disorientation persist  Assessment  & Plan :    Active Problems:   Schizophrenia ([place])   Dementia ([place])   Sepsis secondary to UTI ([place])   DVT (deep venous thrombosis) ([facility])   AKI (acute kidney injury) ([place])   1) E. coli bacteremia and presumed E. coli UTI and sepsis--okay to continue IV meropenem until ESBL is ruled out-  2) DVT - patient with DVT by LE venous u/s 11/22/18  right posterior tibial vein with thrombophlebitis right great saphenous vein. Was unable to afford Xarelto and went unprotected for several days. Leg remains tender.  -V/Q negative, currently on Lovenox, social worker to help with cost of Eliquis otherwise patient will need to be discharged on Coumadin -Due to cost concerns transition to Coumadin with Lovenox bridge  3)AKI----acute kidney injury due to presumed UTI and dehydration    creatinine on admission=3.1  ,   baseline creatinine = 1.2 (10/14/2018)   , creatinine is now= 2.62     , renally adjust medications, avoid nephrotoxic agents / dehydration  / hypotension  4.  Dementia/depressive disorder-okay to continue buspirone, clonazepam, Clozaril, on Aricept  5. Parkinson's -stable, continue Sinemet  6. Dispo - coming from [place] - long term resident. PT eval appreciated recommends home health  Disposition/Need for in-Hospital Stay- patient unable to be discharged at this time due to requiring IV antibiotics for E. coli and gram-negative rod UTI and bacteremia and  sepsis pending final ID and sensitivity*  Code Status : full  Family Communication:   NA (patient is alert, awake and coherent)   Disposition Plan  : hh  Consults  :  na  DVT Prophylaxis  :  Lovenox    Lab Results  Component Value Date   PLT 180 11/29/2018    Inpatient Medications  Scheduled Meds:  busPIRone  15 mg Oral BID   carbidopa-levodopa  2 tablet Oral QID   chlorhexidine  15 mL Mouth Rinse BID   clonazepam  0.25 mg Oral Daily   cloZAPine  75 mg Oral QHS   docusate sodium  100 mg Oral BID   donepezil  10 mg Oral Daily   enoxaparin (LOVENOX) injection  90 mg Subcutaneous Q24H   mouth rinse  15 mL Mouth Rinse q12n4p   polyethylene glycol  17 g Oral Daily   potassium chloride SA  40 mEq Oral BID   rasagiline  1 mg Oral Daily   tamsulosin  0.4 mg Oral Daily   torsemide  40 mg Oral BID   Warfarin - Pharmacist Dosing Inpatient   Does not apply q1800   Continuous Infusions:  dextrose 5 % and 0.45% NaCl 75 mL/hr at 11/30/18 1059   meropenem (MERREM) IV 1 g (11/30/18 0907)   PRN Meds:.acetaminophen  Anti-infectives (From admission, onward)   Start     Dose/Rate Route Frequency Ordered Stop   11/29/18 1000  meropenem (MERREM) 1 g in sodium chloride 0.9 % 100 mL IVPB     1 g 200 mL/hr over 30 Minutes Intravenous Every 12 hours 11/29/18 0824     11/28/18 1830  ciprofloxacin (CIPRO) tablet 500 mg  Status:  Discontinued     500 mg Oral Daily-1800 11/28/18 1746 11/29/18 0824   11/28/18 1500  cefTRIAXone (ROCEPHIN) 1 g in sodium chloride 0.9 % 100 mL IVPB     1 g 200 mL/hr over 30 Minutes Intravenous  Once 11/28/18 1447 11/28/18 1639        Objective:   Vitals:   11/29/18 1400 11/29/18 2128 11/30/18 0542 11/30/18 1438  BP: 111/70 117/65 125/81 119/72  Pulse: 71 66 79 71  Resp: 18 20 18 18   Temp:  98.9 F (37.2 C) 98 F (36.7 C) 98.2 F (36.8 C)  TempSrc:  Oral Oral Oral  SpO2: 94% 100% 100% 100%  Weight:      Height:        Wt  Readings from Last 3 Encounters:  11/28/18 85.7 kg  11/23/18 85.7 kg  11/22/18 85.7 kg     Intake/Output Summary (Last 24 hours) at 11/30/2018 1813 Last data filed at 11/30/2018 1746 Gross per 24 hour  Intake 2374.65 ml  Output 1900 ml  Net 474.65 ml     Physical Exam  Gen:- Awake Alert,  In no apparent distress  HEENT:- [place].AT, No sclera icterus Neck-Supple Neck,No JVD,.  Lungs-  CTAB , fair symmetrical air movement CV- S1, S2 normal, regular  Abd-  +ve B.Sounds, Abd Soft, No tenderness,    Extremity/Skin:-Bilateral lower extremity swelling right more than left, pedal pulses present Psych-affect is appropriate, oriented x3 (episodes of confusion and disorientation) Neuro-generalized weakness no new focal deficits, no tremors   Data Review:   Micro Results Recent Results (from the past 240 hour(s))  Urine Culture     Status: Abnormal   Collection Time: 11/28/18  1:12 PM   Specimen: Urine, Clean Catch  Result Value Ref Range Status   Specimen Description   Final    URINE, CLEAN CATCH Performed at [facility], [address], [place], [place] [address]    Special Requests   Final    NONE Performed at [facility], [address] [place], [place] [address]    Culture >=100,000 COLONIES/mL ESCHERICHIA COLI (A)  Final   Report Status 11/30/2018 FINAL  Final   Organism ID, Bacteria ESCHERICHIA COLI (A)  Final      Susceptibility   Escherichia coli - MIC*    AMPICILLIN >=32 RESISTANT Resistant     CEFAZOLIN <=4 SENSITIVE Sensitive     CEFTRIAXONE <=1 SENSITIVE Sensitive     CIPROFLOXACIN <=0.25 SENSITIVE Sensitive     GENTAMICIN <=1 SENSITIVE Sensitive     IMIPENEM <=0.25 SENSITIVE Sensitive     NITROFURANTOIN <=16 SENSITIVE Sensitive     TRIMETH/SULFA <=20 SENSITIVE Sensitive     AMPICILLIN/SULBACTAM 4 SENSITIVE Sensitive     PIP/TAZO <=4 SENSITIVE Sensitive     Extended ESBL NEGATIVE Sensitive     * >=100,000 COLONIES/mL ESCHERICHIA COLI  SARS CORONAVIRUS 2 (TAT  6-24 HRS) Nasopharyngeal Nasopharyngeal Swab     Status: None   Collection Time: 11/28/18  1:40 PM   Specimen: Nasopharyngeal Swab  Result Value Ref Range Status   SARS Coronavirus 2 NEGATIVE NEGATIVE Final  Comment: (NOTE) SARS-CoV-2 target nucleic acids are NOT DETECTED. The SARS-CoV-2 RNA is generally detectable in upper and lower respiratory specimens during the acute phase of infection. Negative results do not preclude SARS-CoV-2 infection, do not rule out co-infections with other pathogens, and should not be used as the sole basis for treatment or other patient management decisions. Negative results must be combined with clinical observations, patient history, and epidemiological information. The expected result is Negative. Fact Sheet for Patients: SugarRoll.be Fact Sheet for Healthcare Providers: https://www.woods-mathews.com/ This test is not yet approved or cleared by the [place] FDA and  has been authorized for detection and/or diagnosis of SARS-CoV-2 by FDA under an Emergency Use Authorization (EUA). This EUA will remain  in effect (meaning this test can be used) for the duration of the COVID-19 declaration under Section 56 4(b)(1) of the Act, 21 U.S.C. section 360bbb-3(b)(1), unless the authorization is terminated or revoked sooner. Performed at [facility], [place] [address] [place], [place] [address]   Blood Culture (routine x 2)     Status: None (Preliminary result)   Collection Time: 11/28/18  3:50 PM   Specimen: BLOOD LEFT HAND  Result Value Ref Range Status   Specimen Description   Final    BLOOD LEFT HAND Performed at [facility], [address] [place], [place] [address]    Special Requests   Final    BOTTLES DRAWN AEROBIC AND ANAEROBIC Blood Culture adequate volume Performed at [facility], [address], [place], [place] [address]    Culture  Setup Time   Final    GRAM NEGATIVE RODS STYPA,N @  0428 ON 11/29/18 BY JUW GS DONE @ [facility] IN BOTH AEROBIC AND ANAEROBIC BOTTLES    Culture   Final    GRAM NEGATIVE RODS IDENTIFICATION AND SUSCEPTIBILITIES TO FOLLOW Performed at [facility], [place] [address] [place], [place] [address]    Report Status PENDING  Incomplete  Blood Culture ID Panel (Reflexed)     Status: Abnormal   Collection Time: 11/28/18  3:50 PM  Result Value Ref Range Status   Enterococcus species NOT DETECTED NOT DETECTED Final   Listeria monocytogenes NOT DETECTED NOT DETECTED Final   Staphylococcus species NOT DETECTED NOT DETECTED Final   Staphylococcus aureus (BCID) NOT DETECTED NOT DETECTED Final   Streptococcus species NOT DETECTED NOT DETECTED Final   Streptococcus agalactiae NOT DETECTED NOT DETECTED Final   Streptococcus pneumoniae NOT DETECTED NOT DETECTED Final   Streptococcus pyogenes NOT DETECTED NOT DETECTED Final   Acinetobacter baumannii NOT DETECTED NOT DETECTED Final   Enterobacteriaceae species DETECTED (A) NOT DETECTED Final    Comment: Enterobacteriaceae represent a large family of gram-negative bacteria, not a single organism. CRITICAL RESULT CALLED TO, READ BACK BY AND VERIFIED WITH: S. Hamilton, AT [phone] 11/29/18 BY D. VANHOOK    Enterobacter cloacae complex NOT DETECTED NOT DETECTED Final   Escherichia coli DETECTED (A) NOT DETECTED Final    Comment: CRITICAL RESULT CALLED TO, READ BACK BY AND VERIFIED WITH: Gloris Manchester PHARMD, AT 0086 11/29/18 BY D. VANHOOK    Klebsiella oxytoca NOT DETECTED NOT DETECTED Final   Klebsiella pneumoniae NOT DETECTED NOT DETECTED Final   Proteus species NOT DETECTED NOT DETECTED Final   Serratia marcescens NOT DETECTED NOT DETECTED Final   Carbapenem resistance NOT DETECTED NOT DETECTED Final   Haemophilus influenzae NOT DETECTED NOT DETECTED Final   Neisseria meningitidis NOT DETECTED NOT DETECTED Final   Pseudomonas aeruginosa NOT DETECTED NOT  DETECTED Final   Candida albicans NOT DETECTED NOT  DETECTED Final   Candida glabrata NOT DETECTED NOT DETECTED Final   Candida krusei NOT DETECTED NOT DETECTED Final   Candida parapsilosis NOT DETECTED NOT DETECTED Final   Candida tropicalis NOT DETECTED NOT DETECTED Final    Comment: Performed at [facility], [place] [address], [place], [place] [address]  Blood Culture (routine x 2)     Status: None (Preliminary result)   Collection Time: 11/28/18  3:59 PM   Specimen: BLOOD RIGHT ARM  Result Value Ref Range Status   Specimen Description BLOOD RIGHT ARM  Final   Special Requests   Final    BOTTLES DRAWN AEROBIC AND ANAEROBIC Blood Culture adequate volume   Culture   Final    NO GROWTH 2 DAYS Performed at [facility], [address] [place], [place] [address]    Report Status PENDING  Incomplete    Radiology Reports Dg Chest 2 View  Result Date: 11/23/2018 CLINICAL DATA:  Weakness. EXAM: CHEST - 2 VIEW COMPARISON:  Portable chest 10/13/2018. FINDINGS: Mediastinum and hilar structures normal. Stable elevation left hemidiaphragm with mild left base atelectasis. No acute infiltrate. No pleural effusion or pneumothorax. IMPRESSION: Stable elevation left hemidiaphragm with mild atelectasis left lung base. No acute abnormality identified. Electronically Signed   By: Marcello Moores  Register   On: 11/23/2018 14:01   Ct Head Wo Contrast  Result Date: 11/23/2018 CLINICAL DATA:  Altered mental status EXAM: CT HEAD WITHOUT CONTRAST TECHNIQUE: Contiguous axial images were obtained from the base of the skull through the vertex without intravenous contrast. COMPARISON:  None. FINDINGS: Brain: No evidence of acute infarction, hemorrhage, hydrocephalus, extra-axial collection or mass lesion/mass effect. Scattered low-density changes within the periventricular and subcortical white matter compatible with chronic microvascular ischemic change. Mild diffuse cerebral volume loss. Vascular: Mild atherosclerotic calcifications involving the large vessels of the  skull base. No unexpected hyperdense vessel. Skull: Normal. Negative for fracture or focal lesion. Sinuses/Orbits: No acute finding. Other: None. IMPRESSION: No acute intracranial findings. Electronically Signed   By: Davina Poke M.D.   On: 11/23/2018 16:13   Nm Pulmonary Perfusion  Result Date: 11/28/2018 CLINICAL DATA:  PE suspected EXAM: NUCLEAR MEDICINE PERFUSION LUNG SCAN TECHNIQUE: Perfusion images were obtained in multiple projections after intravenous injection of radiopharmaceutical. Ventilation scans intentionally deferred if perfusion scan and chest x-ray adequate for interpretation during COVID 19 epidemic. RADIOPHARMACEUTICALS:  1.6 mCi Tc-57m MAA IV COMPARISON:  Same day chest radiograph FINDINGS: Normal, homogeneous pulmonary perfusion on diffusion only imaging. No suspicious perfusion defect. IMPRESSION: Normal pulmonary perfusion scan.  No evidence of pulmonary embolism. Electronically Signed   By: Eddie Candle M.D.   On: 11/28/2018 19:53   US Venous Img Lower Unilateral Right  Result Date: 11/22/2018 CLINICAL DATA:  Lower extremity edema and pain. EXAM: RIGHT LOWER EXTREMITY VENOUS DOPPLER ULTRASOUND TECHNIQUE: Gray-scale sonography with graded compression, as well as color Doppler and duplex ultrasound were performed to evaluate the lower extremity deep venous systems from the level of the common femoral vein and including the common femoral, femoral, profunda femoral, popliteal and calf veins including the posterior tibial, peroneal and gastrocnemius veins when visible. The superficial great saphenous vein was also interrogated. Spectral Doppler was utilized to evaluate flow at rest and with distal augmentation maneuvers in the common femoral, femoral and popliteal veins. COMPARISON:  September 13, 2011.  October 13, 2018. FINDINGS: Contralateral Common Femoral Vein: Respiratory phasicity is normal and symmetric with the symptomatic  side. No evidence of thrombus. Normal compressibility.  Common Femoral Vein: No evidence of thrombus. Normal compressibility, respiratory phasicity and response to augmentation. Saphenofemoral Junction: No evidence of thrombus. Normal compressibility and flow on color Doppler imaging. Profunda Femoral Vein: No evidence of thrombus. Normal compressibility and flow on color Doppler imaging. Femoral Vein: No evidence of thrombus. Normal compressibility, respiratory phasicity and response to augmentation. Popliteal Vein: No evidence of thrombus. Normal compressibility, respiratory phasicity and response to augmentation. Calf Veins: There is nonocclusive thrombus in the posterior tibial vein. Superficial Great Saphenous Vein: There is thrombus within the great saphenous vein. Other Findings:  Nonspecific lower extremity edema is noted. IMPRESSION: 1. Study is positive for a nonocclusive DVT involving the posterior tibial vein on the right. 2. Superficial thrombophlebitis involving the distal great saphenous vein. 3. Lower extremity edema. Electronically Signed   By: Constance Holster M.D.   On: 11/22/2018 16:42   Dg Chest Portable 1 View  Result Date: 11/28/2018 CLINICAL DATA:  Chronic swelling in the legs. EXAM: PORTABLE CHEST 1 VIEW COMPARISON:  11/23/2018 FINDINGS: The heart size and mediastinal contours are within normal limits. Both lungs are clear. The visualized skeletal structures are unremarkable. IMPRESSION: No active disease. Electronically Signed   By: Van Clines M.D.   On: 11/28/2018 14:04     CBC Recent Labs  Lab 11/28/18 1312 11/29/18 0630  WBC 16.7* 12.9*  HGB 12.9* 11.9*  HCT 42.1 39.8  PLT 179 180  MCV 88.4 89.8  MCH 27.1 26.9  MCHC 30.6 29.9*  RDW 15.8* 16.0*  LYMPHSABS 0.7  --   MONOABS 0.9  --   EOSABS 0.0  --   BASOSABS 0.0  --     Chemistries  Recent Labs  Lab 11/28/18 1312 11/29/18 0630  NA 140 144  K 4.6 3.5  CL 102 104  CO2 25 27  GLUCOSE 104* 112*  BUN 65* 56*  CREATININE 3.13* 2.62*  CALCIUM 9.2 8.4*    AST 100*  --   ALT 7  --   ALKPHOS 84  --   BILITOT 1.4*  --    ------------------------------------------------------------------------------------------------------------------ No results for input(s): CHOL, HDL, LDLCALC, TRIG, CHOLHDL, LDLDIRECT in the last 72 hours.  No results found for: HGBA1C ------------------------------------------------------------------------------------------------------------------ No results for input(s): TSH, T4TOTAL, T3FREE, THYROIDAB in the last 72 hours.  Invalid input(s): FREET3 ------------------------------------------------------------------------------------------------------------------ No results for input(s): VITAMINB12, FOLATE, FERRITIN, TIBC, IRON, RETICCTPCT in the last 72 hours.  Coagulation profile Recent Labs  Lab 11/28/18 1312 11/30/18 1426  INR 1.2 1.4*    Recent Labs    11/28/18 1312  DDIMER 7.21*    Cardiac Enzymes No results for input(s): CKMB, TROPONINI, MYOGLOBIN in the last 168 hours.  Invalid input(s): CK ------------------------------------------------------------------------------------------------------------------    Component Value Date/Time   BNP 14.0 11/22/2018 1427   Darrold Bezek M.D on 11/30/2018 at 6:13 PM  Go to [email or web address] - for contact info  [facility] - Office  [phone]

## 2018-11-30 NOTE — Evaluation (Signed)
Occupational Therapy Evaluation Patient Details Name: [name] MRN: [id number] DOB: [birth year] Today's Date: 11/30/2018    History of Present Illness 68 year old male with history of dementia, Parkinson's, schizophrenia, mental retardation, recently diagnosed with right lower extremity DVT on 11/22/2018. He is a resident at [facility] brought here via EMS for evaluation of weakness.  Patient is a poor historian.  From prior note from 4 days ago when patient was seen in the ED 9/28 and 11/23/2018. Patient has a known right lower extremity DVT and was  having trouble obtaining his Xarelto anticoagulant medication going unprotected for several days.  He also has generalized weakness and recurrent falls.  He was evaluated and it was felt that his difficulty walking is due to his right lower extremity DVT.  He received IV fluid and was able to tolerate p.o. and subsequently discharged home with social work and case management involvement to help with Xarelto.  Today I was able to talk to patient's caregiver at the facility via the phone.  Patient did show some mild improvement when he was discharged from the ER however within the past 2 days he has been not himself.  He is weak, unable to get up without assistance, unable to ambulate or care for himself.  He slumped over and is not eating which is unusual for him.  He does not specifically complain of anything but due to his progressive decline in his strength and mental status, staff felt patient will need to evaluate further and arranged for transport to [facility]   Clinical Impression   Pt up in recliner upon therapy arrival with lunch tray finished near him. Patient found soiled and appeared to have needed the bathroom and did not make it there in time. Therapist began to assist patient with toilet hygiene and gown change. Nursing arrived shortly after and therapist reported telemetry low battery, removed leads, and IV management  needs. Patient was cleaned up and provided with additional toileting while ambulating to and from toilet using RW and [place]. He required assistance for all bathing and dressing due to cognitive deficits. Patient was able to follow simple cues for direction while using RW. He did wash his face and hands when presented with washcloth although required additional assistance for thoroughness. Due to patient's baseline cognitive deficits he is not a candidate for OT services and will require 24/7 assistance to complete basic ADL tasks such as bathing, dressing, and grooming. If staff at group home are able to supply this assist, patient will be safe to return to residence. No follow OT services recommended at this time.     Follow Up Recommendations  No OT follow up    Equipment Recommendations  None recommended by OT       Precautions / Restrictions Precautions Precautions: Fall Restrictions Weight Bearing Restrictions: No      Mobility    Transfers Overall transfer level: Needs assistance Equipment used: Rolling walker (2 wheeled) Transfers: Sit to/from [facility] Sit to Stand: Min assist Stand pivot transfers: Min assist       General transfer comment: Pt was initially against using RW although did comply when therapist cued him. Decreased safety awareness present when ambulating to bathroom and attempting to sit while IV was about to be pulled from his arm.    Balance Overall balance assessment: Needs assistance Sitting-balance support: Feet supported;No upper extremity supported Sitting balance-Leahy Scale: Poor     Standing balance support: During functional activity;Bilateral upper  extremity supported Standing balance-Leahy Scale: Poor      ADL either performed or assessed with clinical judgement   ADL Overall ADL's : At baseline;Needs assistance/impaired Eating/Feeding: Set up;Sitting   Grooming: Wash/dry face;Wash/dry hands;Set  up;Supervision/safety;Sitting   Upper Body Bathing: Minimal assistance;Sitting   Lower Body Bathing: Minimal assistance;Sit to/from stand   Upper Body Dressing : Minimal assistance;Sitting   Lower Body Dressing: Minimal assistance;Sit to/from stand   Toilet Transfer: Minimal assistance;Regular Toilet;Grab bars;RW;Ambulation           Functional mobility during ADLs: Minimal assistance;Cueing for sequencing;Cueing for safety;Rolling walker       Vision Baseline Vision/History: (unknown) Patient Visual Report: (unable to communicate) Additional Comments: No apparent visual deficits demonstrated during functional task.            Pertinent Vitals/Pain Pain Assessment: Faces Faces Pain Scale: No hurt     Hand Dominance (unknown. Unable to demonstrate during functional task)   Extremity/Trunk Assessment Upper Extremity Assessment Upper Extremity Assessment: Overall WFL for tasks assessed   Lower Extremity Assessment Lower Extremity Assessment: Defer to PT evaluation       Communication Communication Communication: Expressive difficulties   Cognition   Behavior During Therapy: Restless Overall Cognitive Status: History of cognitive impairments - at baseline                 Home Living Family/patient expects to be discharged to:: Group home([facility])       Prior Functioning/Environment Level of Independence: Needs assistance  Gait / Transfers Assistance Needed: household ambulator ADL's / Homemaking Assistance Needed: assisted by group home staff            OT Problem List: Decreased safety awareness       AM-PAC OT "6 Clicks" Daily Activity     Outcome Measure Help from another person eating meals?: A Little Help from another person taking care of personal grooming?: A Little Help from another person toileting, which includes using toliet, bedpan, or urinal?: A Lot Help from another person bathing (including washing, rinsing, drying)?:  A Lot Help from another person to put on and taking off regular upper body clothing?: A Lot Help from another person to put on and taking off regular lower body clothing?: A Lot 6 Click Score: 14   End of Session Equipment Utilized During Treatment: Gait belt;Rolling walker Nurse Communication: Mobility status  Activity Tolerance: Patient tolerated treatment well Patient left: in chair;with call bell/phone within reach;with chair alarm set;with nursing/sitter in room  OT Visit Diagnosis: Muscle weakness (generalized) (M62.81)                Time: 1330-1400 OT Time Calculation (min): 30 min Charges:  OT General Charges $OT Visit: 1 Visit OT Evaluation $OT Eval Low Complexity: 1 Low  Ailene Ravel, OTR/L,CBIS  [phone]   Desman Polak, Clarene Duke 11/30/2018, 2:11 PM

## 2018-11-30 NOTE — Progress Notes (Signed)
ANTICOAGULATION CONSULT NOTE - Initial Consult  Pharmacy Consult for  Warfarin dosing Indication: VTE treatment  Allergies  Allergen Reactions  . Penicillins     .Did it involve swelling of the face/tongue/throat, SOB, or low BP? Unknown Did it involve sudden or severe rash/hives, skin peeling, or any reaction on the inside of your mouth or nose? Unknown Did you need to seek medical attention at a hospital or doctor's office? Unknown When did it last happen?Unknown If all above answers are "NO", may proceed with cephalosporin use.     Patient Measurements: Height: 6' (182.9 cm) Weight: 188 lb 15 oz (85.7 kg) IBW/kg (Calculated) : 77.6 Heparin Dosing Weight: HEPARIN DW (KG): 85.7  Vital Signs: Temp: 98 F (36.7 C) (10/06 0542) Temp Source: Oral (10/06 0542) BP: 125/81 (10/06 0542) Pulse Rate: 79 (10/06 0542)  Labs: Recent Labs    11/28/18 1312 11/29/18 0630  HGB 12.9* 11.9*  HCT 42.1 39.8  PLT 179 180  APTT 37*  --   LABPROT 15.3*  --   INR 1.2  --   CREATININE 3.13* 2.62*    Estimated Creatinine Clearance: 29.6 mL/min (A) (by C-G formula based on SCr of 2.62 mg/dL (H)).   Medical History: Past Medical History:  Diagnosis Date  . Cellulitis   . Dementia ([place])   . Edema   . Hypertension   . Hypoglycemia   . Mental retardation   . Parkinson's disease ([place])   . Psychosis ([place])   . Renal insufficiency       Assessment: Pharmacy consulted to dose warfarin for this 68 yo male patient currently on apixaban 5mg  bid for VTE treatment.  Patient is  unable to afford apixaban therapy as an outpatient, so warfarin will be started with Lovenox bridge until INR is  therapeutic.   Goal of Therapy:  INR 2-3 Monitor platelets by anticoagulation protocol: Yes   Plan:  Give warfarin 5mg  po x1 dose tonight Start Lovenox 90mg  sub-q daily for CrClest~ 29 mL/min CBC ordered qM-W-F Daily PT/INR Pharmacy to monitor renal fx and signs/symptoms of  bleeding.   Despina Pole, Pharm. D. Clinical Pharmacist 11/30/2018 2:21 PM

## 2018-12-01 LAB — CBC
HCT: 37.4 % — ABNORMAL LOW (ref 39.0–52.0)
Hemoglobin: 11.3 g/dL — ABNORMAL LOW (ref 13.0–17.0)
MCH: 27.4 pg (ref 26.0–34.0)
MCHC: 30.2 g/dL (ref 30.0–36.0)
MCV: 90.6 fL (ref 80.0–100.0)
Platelets: 168 10*3/uL (ref 150–400)
RBC: 4.13 MIL/uL — ABNORMAL LOW (ref 4.22–5.81)
RDW: 16.1 % — ABNORMAL HIGH (ref 11.5–15.5)
WBC: 10.7 10*3/uL — ABNORMAL HIGH (ref 4.0–10.5)
nRBC: 0 % (ref 0.0–0.2)

## 2018-12-01 LAB — BASIC METABOLIC PANEL
Anion gap: 11 (ref 5–15)
BUN: 32 mg/dL — ABNORMAL HIGH (ref 8–23)
CO2: 32 mmol/L (ref 22–32)
Calcium: 8.3 mg/dL — ABNORMAL LOW (ref 8.9–10.3)
Chloride: 101 mmol/L (ref 98–111)
Creatinine, Ser: 1.76 mg/dL — ABNORMAL HIGH (ref 0.61–1.24)
GFR calc Af Amer: 45 mL/min — ABNORMAL LOW (ref >60)
GFR calc non Af Amer: 39 mL/min — ABNORMAL LOW (ref >60)
Glucose, Bld: 117 mg/dL — ABNORMAL HIGH (ref 70–99)
Potassium: 3.9 mmol/L (ref 3.5–5.1)
Sodium: 144 mmol/L (ref 135–145)

## 2018-12-01 LAB — CULTURE, BLOOD (ROUTINE X 2): Special Requests: ADEQUATE

## 2018-12-01 LAB — PROTIME-INR
INR: 1.4 — ABNORMAL HIGH (ref 0.8–1.2)
Prothrombin Time: 16.9 seconds — ABNORMAL HIGH (ref 11.4–15.2)

## 2018-12-01 MED ORDER — CEPHALEXIN 500 MG PO CAPS: 500.0000 mg | ORAL_CAPSULE | Freq: Two times a day (BID) | ORAL | Status: DC

## 2018-12-01 MED ORDER — ENOXAPARIN SODIUM 100 MG/ML ~~LOC~~ SOLN
90.0000 mg | Freq: Two times a day (BID) | SUBCUTANEOUS | Status: DC
  Administered 2018-12-01 – 2018-12-04 (×7): 90 mg via SUBCUTANEOUS
  Filled 2018-12-01 (×7): qty 1

## 2018-12-01 MED ORDER — SODIUM CHLORIDE 0.9 % IV SOLN
1.0000 g | Freq: Once | INTRAVENOUS | Status: AC
  Administered 2018-12-01: 10:00:00 1 g via INTRAVENOUS
  Filled 2018-12-01: qty 10

## 2018-12-01 MED ORDER — WARFARIN SODIUM 5 MG PO TABS
10.0000 mg | ORAL_TABLET | Freq: Once | ORAL | Status: AC
  Administered 2018-12-01: 10 mg via ORAL
  Filled 2018-12-01: qty 2

## 2018-12-01 MED ORDER — SODIUM CHLORIDE 0.9 % IV SOLN
1.0000 g | Freq: Once | INTRAVENOUS | Status: AC
  Administered 2018-12-01: 16:00:00 1 g via INTRAVENOUS
  Filled 2018-12-01: qty 10

## 2018-12-01 NOTE — Progress Notes (Signed)
Has been calm today. Walked with PT in hallway and sat in chair for several hours without trying to get up alone  Cooperative with other care and taking medication

## 2018-12-01 NOTE — Progress Notes (Signed)
ANTICOAGULATION CONSULT NOTE - Initial Consult  Pharmacy Consult for  Warfarin dosing Indication: VTE treatment  Allergies  Allergen Reactions  . Penicillins     .Did it involve swelling of the face/tongue/throat, SOB, or low BP? Unknown Did it involve sudden or severe rash/hives, skin peeling, or any reaction on the inside of your mouth or nose? Unknown Did you need to seek medical attention at a hospital or doctor's office? Unknown When did it last happen?Unknown If all above answers are "NO", may proceed with cephalosporin use.     Patient Measurements: Height: 6' (182.9 cm) Weight: 188 lb 15 oz (85.7 kg) IBW/kg (Calculated) : 77.6 Heparin Dosing Weight: HEPARIN DW (KG): 85.7  Vital Signs: Temp: 98.7 F (37.1 C) (10/07 0455) Temp Source: Oral (10/07 0455) BP: 107/61 (10/07 0455) Pulse Rate: 69 (10/07 0455)  Labs: Recent Labs    11/28/18 1312 11/29/18 0630 11/30/18 1426 12/01/18 0506  HGB 12.9* 11.9*  --  11.3*  HCT 42.1 39.8  --  37.4*  PLT 179 180  --  168  APTT 37*  --   --   --   LABPROT 15.3*  --  17.0* 16.9*  INR 1.2  --  1.4* 1.4*  CREATININE 3.13* 2.62*  --  1.76*    Estimated Creatinine Clearance: 44.1 mL/min (A) (by C-G formula based on SCr of 1.76 mg/dL (H)).   Medical History: Past Medical History:  Diagnosis Date  . Cellulitis   . Dementia ([place])   . Edema   . Hypertension   . Hypoglycemia   . Mental retardation   . Parkinson's disease ([place])   . Psychosis ([place])   . Renal insufficiency       Assessment: Pharmacy consulted to dose warfarin for this 68 yo male patient currently on apixaban 5mg  bid for VTE treatment.  Patient is  unable to afford apixaban therapy as an outpatient, so warfarin will be started with Lovenox bridge until INR is  therapeutic.   Goal of Therapy:  INR 2-3 Monitor platelets by anticoagulation protocol: Yes   Plan:  Give warfarin  10mg  po x1 dose tonight Increase  Lovenox to 90mg  sub-q q12h for CrClest~  44 mL/min CBC ordered qM-W-F Daily PT/INR Pharmacy to monitor renal fx and signs/symptoms of bleeding.   Despina Pole, Pharm. D. Clinical Pharmacist 12/01/2018 9:47 AM

## 2018-12-01 NOTE — Progress Notes (Signed)
Patient Demographics:    [name], is a 68 y.o. male, DOB - 09-11-1950, YQI:[id number]  Admit date - 11/28/2018   Admitting Physician Neena Rhymes, MD  Outpatient Primary MD for the patient is Rosita Fire, MD  LOS - 3   Chief Complaint  Patient presents with   Weakness        Subjective:    [name] today has no fevers, no emesis,  No chest pain,   oral intake is fair episodes of confusion persist  Assessment  & Plan :    Active Problems:   Schizophrenia ([place])   Dementia ([place])   Sepsis secondary to UTI [facility])   DVT (deep venous thrombosis) (H[facility])   AKI (acute kidney injury) ([place])   1) E. coli bacteremia and p E. coli UTI and sepsis--- treated with IV meropenem initially with concerns about possible ESBL infection, however urine and blood cultures from 11/28/2018 with mostly pansensitive E. coli, IV Rocephin 2 g x 1 dose on 12/01/2018 then switched to p.o. Keflex starting 12/02/2018   2) DVT - patient with DVT by LE venous u/s 11/22/18  right posterior tibial vein with thrombophlebitis right great saphenous vein. Was unable to afford Xarelto and went unprotected for several days.  -V/Q negative,  -Due to cost concerns transition to Coumadin with Lovenox bridge  3)AKI----acute kidney injury due to presumed UTI and dehydration    creatinine on admission=3.1  ,   baseline creatinine = 1.2 (10/14/2018)   , creatinine is now= 1.76    , renally adjust medications, avoid nephrotoxic agents / dehydration  / hypotension -Renal function has improved with hydration  4.  Dementia/depressive disorder-okay to continue buspirone, clonazepam, Clozaril, on Aricept  5. Parkinson's -stable, continue Sinemet  6. Dispo - coming from [place] - long term resident. PT eval appreciated recommends home health  Disposition/Need for in-Hospital Stay- patient unable to be discharged at this time due  to requiring IV antibiotics for E. coli  bacteremia and sepsis  -Plan to discharge back to group home when INR therapeutic currently on Lovenox bridge  Code Status : full  Family Communication:   NA (patient is alert, awake and coherent)   Disposition Plan  : hh at group home  Consults  :  na  DVT Prophylaxis  :  Lovenox --bridging for Coumadin  Lab Results  Component Value Date   PLT 168 12/01/2018    Inpatient Medications  Scheduled Meds:  busPIRone  15 mg Oral BID   carbidopa-levodopa  2 tablet Oral QID   [START ON 12/02/2018] cephALEXin  500 mg Oral Q12H   chlorhexidine  15 mL Mouth Rinse BID   clonazepam  0.25 mg Oral Daily   cloZAPine  75 mg Oral QHS   docusate sodium  100 mg Oral BID   donepezil  10 mg Oral Daily   enoxaparin (LOVENOX) injection  90 mg Subcutaneous Q12H   mouth rinse  15 mL Mouth Rinse q12n4p   polyethylene glycol  17 g Oral Daily   potassium chloride SA  40 mEq Oral BID   rasagiline  1 mg Oral Daily   tamsulosin  0.4 mg Oral Daily   torsemide  40 mg Oral BID   warfarin  10  mg Oral Once   Warfarin - Pharmacist Dosing Inpatient   Does not apply q1800   Continuous Infusions:  dextrose 5 % and 0.45% NaCl 75 mL/hr at 12/01/18 0408   meropenem (MERREM) IV 1 g (12/01/18 0934)   PRN Meds:.acetaminophen    Anti-infectives (From admission, onward)   Start     Dose/Rate Route Frequency Ordered Stop   12/02/18 1000  cephALEXin (KEFLEX) capsule 500 mg     500 mg Oral Every 12 hours 12/01/18 0802     12/01/18 1400  cefTRIAXone (ROCEPHIN) 1 g in sodium chloride 0.9 % 100 mL IVPB     1 g 200 mL/hr over 30 Minutes Intravenous  Once 12/01/18 1357     12/01/18 0815  cefTRIAXone (ROCEPHIN) 1 g in sodium chloride 0.9 % 100 mL IVPB     1 g 200 mL/hr over 30 Minutes Intravenous  Once 12/01/18 0801 12/01/18 1049   11/29/18 1000  meropenem (MERREM) 1 g in sodium chloride 0.9 % 100 mL IVPB  Status:  Discontinued     1 g 200 mL/hr over 30  Minutes Intravenous Every 12 hours 11/29/18 0824 12/01/18 1355   11/28/18 1830  ciprofloxacin (CIPRO) tablet 500 mg  Status:  Discontinued     500 mg Oral Daily-1800 11/28/18 1746 11/29/18 0824   11/28/18 1500  cefTRIAXone (ROCEPHIN) 1 g in sodium chloride 0.9 % 100 mL IVPB     1 g 200 mL/hr over 30 Minutes Intravenous  Once 11/28/18 1447 11/28/18 1639        Objective:   Vitals:   11/30/18 1438 11/30/18 2157 12/01/18 0455 12/01/18 1247  BP: 119/72 125/74 107/61 118/69  Pulse: 71 73 69 71  Resp: 18 20 17 17   Temp: 98.2 F (36.8 C) 98.3 F (36.8 C) 98.7 F (37.1 C) 98.5 F (36.9 C)  TempSrc: Oral Oral Oral Oral  SpO2: 100% 97% 100% 100%  Weight:      Height:        Wt Readings from Last 3 Encounters:  11/28/18 85.7 kg  11/23/18 85.7 kg  11/22/18 85.7 kg     Intake/Output Summary (Last 24 hours) at 12/01/2018 1354 Last data filed at 12/01/2018 1002 Gross per 24 hour  Intake 2550.63 ml  Output 500 ml  Net 2050.63 ml     Physical Exam  Gen:- Awake Alert,  In no apparent distress  HEENT:- [place].AT, No sclera icterus Neck-Supple Neck,No JVD,.  Lungs-  CTAB , fair symmetrical air movement CV- S1, S2 normal, regular  Abd-  +ve B.Sounds, Abd Soft, No tenderness, no CVA area tenderness Extremity/Skin:-Bilateral lower extremity swelling right more than left, pedal pulses present Psych-affect is appropriate, oriented x3 (episodes of confusion and disorientation) Neuro-generalized weakness no new focal deficits, no tremors   Data Review:   Micro Results Recent Results (from the past 240 hour(s))  Urine Culture     Status: Abnormal   Collection Time: 11/28/18  1:12 PM   Specimen: Urine, Clean Catch  Result Value Ref Range Status   Specimen Description   Final    URINE, CLEAN CATCH Performed at [facility], [address], [place], [place] [address]    Special Requests   Final    NONE Performed at [facility], [address], [place],  [address]    Culture  >=100,000 COLONIES/mL ESCHERICHIA COLI (A)  Final   Report Status 11/30/2018 FINAL  Final   Organism ID, Bacteria ESCHERICHIA COLI (A)  Final  Susceptibility   Escherichia coli - MIC*    AMPICILLIN >=32 RESISTANT Resistant     CEFAZOLIN <=4 SENSITIVE Sensitive     CEFTRIAXONE <=1 SENSITIVE Sensitive     CIPROFLOXACIN <=0.25 SENSITIVE Sensitive     GENTAMICIN <=1 SENSITIVE Sensitive     IMIPENEM <=0.25 SENSITIVE Sensitive     NITROFURANTOIN <=16 SENSITIVE Sensitive     TRIMETH/SULFA <=20 SENSITIVE Sensitive     AMPICILLIN/SULBACTAM 4 SENSITIVE Sensitive     PIP/TAZO <=4 SENSITIVE Sensitive     Extended ESBL NEGATIVE Sensitive     * >=100,000 COLONIES/mL ESCHERICHIA COLI  SARS CORONAVIRUS 2 (TAT 6-24 HRS) Nasopharyngeal Nasopharyngeal Swab     Status: None   Collection Time: 11/28/18  1:40 PM   Specimen: Nasopharyngeal Swab  Result Value Ref Range Status   SARS Coronavirus 2 NEGATIVE NEGATIVE Final    Comment: (NOTE) SARS-CoV-2 target nucleic acids are NOT DETECTED. The SARS-CoV-2 RNA is generally detectable in upper and lower respiratory specimens during the acute phase of infection. Negative results do not preclude SARS-CoV-2 infection, do not rule out co-infections with other pathogens, and should not be used as the sole basis for treatment or other patient management decisions. Negative results must be combined with clinical observations, patient history, and epidemiological information. The expected result is Negative. Fact Sheet for Patients: SugarRoll.be Fact Sheet for Healthcare Providers: https://www.woods-mathews.com/ This test is not yet approved or cleared by the [place] FDA and  has been authorized for detection and/or diagnosis of SARS-CoV-2 by FDA under an Emergency Use Authorization (EUA). This EUA will remain  in effect (meaning this test can be used) for the duration of the COVID-19 declaration under Section  56 4(b)(1) of the Act, 21 U.S.C. section 360bbb-3(b)(1), unless the authorization is terminated or revoked sooner. Performed at [facility], [place] [address], [place], [place] [address]   Blood Culture (routine x 2)     Status: Abnormal   Collection Time: 11/28/18  3:50 PM   Specimen: BLOOD LEFT HAND  Result Value Ref Range Status   Specimen Description   Final    BLOOD LEFT HAND Performed at [facility], [address], [place], [place] [address]    Special Requests   Final    BOTTLES DRAWN AEROBIC AND ANAEROBIC Blood Culture adequate volume Performed at [facility], [address] [place], [place] [address]    Culture  Setup Time   Final    GRAM NEGATIVE RODS STYPA,N @ 0428 ON 11/29/18 BY JUW GS DONE @ [facility] IN BOTH AEROBIC AND ANAEROBIC BOTTLES    Culture ESCHERICHIA COLI (A)  Final   Report Status 12/01/2018 FINAL  Final   Organism ID, Bacteria ESCHERICHIA COLI  Final      Susceptibility   Escherichia coli - MIC*    AMPICILLIN >=32 RESISTANT Resistant     CEFAZOLIN <=4 SENSITIVE Sensitive     CEFEPIME <=1 SENSITIVE Sensitive     CEFTAZIDIME <=1 SENSITIVE Sensitive     CEFTRIAXONE <=1 SENSITIVE Sensitive     CIPROFLOXACIN <=0.25 SENSITIVE Sensitive     GENTAMICIN <=1 SENSITIVE Sensitive     IMIPENEM <=0.25 SENSITIVE Sensitive     TRIMETH/SULFA <=20 SENSITIVE Sensitive     AMPICILLIN/SULBACTAM 4 SENSITIVE Sensitive     PIP/TAZO <=4 SENSITIVE Sensitive     Extended ESBL NEGATIVE Sensitive     * ESCHERICHIA COLI  Blood Culture ID Panel (Reflexed)     Status: Abnormal   Collection Time: 11/28/18  3:50 PM  Result Value Ref Range Status   Enterococcus species NOT DETECTED NOT DETECTED Final   Listeria monocytogenes NOT DETECTED NOT DETECTED Final   Staphylococcus species NOT DETECTED NOT DETECTED Final   Staphylococcus aureus (BCID) NOT DETECTED NOT DETECTED Final   Streptococcus species NOT DETECTED NOT DETECTED Final   Streptococcus agalactiae NOT DETECTED NOT  DETECTED Final   Streptococcus pneumoniae NOT DETECTED NOT DETECTED Final   Streptococcus pyogenes NOT DETECTED NOT DETECTED Final   Acinetobacter baumannii NOT DETECTED NOT DETECTED Final   Enterobacteriaceae species DETECTED (A) NOT DETECTED Final    Comment: Enterobacteriaceae represent a large family of gram-negative bacteria, not a single organism. CRITICAL RESULT CALLED TO, READ BACK BY AND VERIFIED WITH: S. [place], AT [phone] 11/29/18 BY D. VANHOOK    Enterobacter cloacae complex NOT DETECTED NOT DETECTED Final   Escherichia coli DETECTED (A) NOT DETECTED Final    Comment: CRITICAL RESULT CALLED TO, READ BACK BY AND VERIFIED WITH: Gloris Manchester PHARMD, AT [phone] 11/29/18 BY D. VANHOOK    Klebsiella oxytoca NOT DETECTED NOT DETECTED Final   Klebsiella pneumoniae NOT DETECTED NOT DETECTED Final   Proteus species NOT DETECTED NOT DETECTED Final   Serratia marcescens NOT DETECTED NOT DETECTED Final   Carbapenem resistance NOT DETECTED NOT DETECTED Final   Haemophilus influenzae NOT DETECTED NOT DETECTED Final   Neisseria meningitidis NOT DETECTED NOT DETECTED Final   Pseudomonas aeruginosa NOT DETECTED NOT DETECTED Final   Candida albicans NOT DETECTED NOT DETECTED Final   Candida glabrata NOT DETECTED NOT DETECTED Final   Candida krusei NOT DETECTED NOT DETECTED Final   Candida parapsilosis NOT DETECTED NOT DETECTED Final   Candida tropicalis NOT DETECTED NOT DETECTED Final    Comment: Performed at [facility], [place] [address], [place], [place] [address]  Blood Culture (routine x 2)     Status: None (Preliminary result)   Collection Time: 11/28/18  3:59 PM   Specimen: BLOOD RIGHT ARM  Result Value Ref Range Status   Specimen Description BLOOD RIGHT ARM  Final   Special Requests   Final    BOTTLES DRAWN AEROBIC AND ANAEROBIC Blood Culture adequate volume   Culture   Final    NO GROWTH 3 DAYS Performed at [facility], [address], [place], [place] [address]    Report  Status PENDING  Incomplete    Radiology Reports Dg Chest 2 View  Result Date: 11/23/2018 CLINICAL DATA:  Weakness. EXAM: CHEST - 2 VIEW COMPARISON:  Portable chest 10/13/2018. FINDINGS: Mediastinum and hilar structures normal. Stable elevation left hemidiaphragm with mild left base atelectasis. No acute infiltrate. No pleural effusion or pneumothorax. IMPRESSION: Stable elevation left hemidiaphragm with mild atelectasis left lung base. No acute abnormality identified. Electronically Signed   By: Marcello Moores  Register   On: 11/23/2018 14:01   Ct Head Wo Contrast  Result Date: 11/23/2018 CLINICAL DATA:  Altered mental status EXAM: CT HEAD WITHOUT CONTRAST TECHNIQUE: Contiguous axial images were obtained from the base of the skull through the vertex without intravenous contrast. COMPARISON:  None. FINDINGS: Brain: No evidence of acute infarction, hemorrhage, hydrocephalus, extra-axial collection or mass lesion/mass effect. Scattered low-density changes within the periventricular and subcortical white matter compatible with chronic microvascular ischemic change. Mild diffuse cerebral volume loss. Vascular: Mild atherosclerotic calcifications involving the large vessels of the skull base. No unexpected hyperdense vessel. Skull: Normal. Negative for fracture or focal lesion. Sinuses/Orbits: No acute finding. Other: None. IMPRESSION: No acute intracranial findings. Electronically  Signed   By: Davina Poke M.D.   On: 11/23/2018 16:13   Nm Pulmonary Perfusion  Result Date: 11/28/2018 CLINICAL DATA:  PE suspected EXAM: NUCLEAR MEDICINE PERFUSION LUNG SCAN TECHNIQUE: Perfusion images were obtained in multiple projections after intravenous injection of radiopharmaceutical. Ventilation scans intentionally deferred if perfusion scan and chest x-ray adequate for interpretation during COVID 19 epidemic. RADIOPHARMACEUTICALS:  1.6 mCi Tc-54m MAA IV COMPARISON:  Same day chest radiograph FINDINGS: Normal, homogeneous  pulmonary perfusion on diffusion only imaging. No suspicious perfusion defect. IMPRESSION: Normal pulmonary perfusion scan.  No evidence of pulmonary embolism. Electronically Signed   By: Eddie Candle M.D.   On: 11/28/2018 19:53   US Venous Img Lower Unilateral Right  Result Date: 11/22/2018 CLINICAL DATA:  Lower extremity edema and pain. EXAM: RIGHT LOWER EXTREMITY VENOUS DOPPLER ULTRASOUND TECHNIQUE: Gray-scale sonography with graded compression, as well as color Doppler and duplex ultrasound were performed to evaluate the lower extremity deep venous systems from the level of the common femoral vein and including the common femoral, femoral, profunda femoral, popliteal and calf veins including the posterior tibial, peroneal and gastrocnemius veins when visible. The superficial great saphenous vein was also interrogated. Spectral Doppler was utilized to evaluate flow at rest and with distal augmentation maneuvers in the common femoral, femoral and popliteal veins. COMPARISON:  September 13, 2011.  October 13, 2018. FINDINGS: Contralateral Common Femoral Vein: Respiratory phasicity is normal and symmetric with the symptomatic side. No evidence of thrombus. Normal compressibility. Common Femoral Vein: No evidence of thrombus. Normal compressibility, respiratory phasicity and response to augmentation. Saphenofemoral Junction: No evidence of thrombus. Normal compressibility and flow on color Doppler imaging. Profunda Femoral Vein: No evidence of thrombus. Normal compressibility and flow on color Doppler imaging. Femoral Vein: No evidence of thrombus. Normal compressibility, respiratory phasicity and response to augmentation. Popliteal Vein: No evidence of thrombus. Normal compressibility, respiratory phasicity and response to augmentation. Calf Veins: There is nonocclusive thrombus in the posterior tibial vein. Superficial Great Saphenous Vein: There is thrombus within the great saphenous vein. Other Findings:   Nonspecific lower extremity edema is noted. IMPRESSION: 1. Study is positive for a nonocclusive DVT involving the posterior tibial vein on the right. 2. Superficial thrombophlebitis involving the distal great saphenous vein. 3. Lower extremity edema. Electronically Signed   By: Constance Holster M.D.   On: 11/22/2018 16:42   Dg Chest Portable 1 View  Result Date: 11/28/2018 CLINICAL DATA:  Chronic swelling in the legs. EXAM: PORTABLE CHEST 1 VIEW COMPARISON:  11/23/2018 FINDINGS: The heart size and mediastinal contours are within normal limits. Both lungs are clear. The visualized skeletal structures are unremarkable. IMPRESSION: No active disease. Electronically Signed   By: Van Clines M.D.   On: 11/28/2018 14:04     CBC Recent Labs  Lab 11/28/18 1312 11/29/18 0630 12/01/18 0506  WBC 16.7* 12.9* 10.7*  HGB 12.9* 11.9* 11.3*  HCT 42.1 39.8 37.4*  PLT 179 180 168  MCV 88.4 89.8 90.6  MCH 27.1 26.9 27.4  MCHC 30.6 29.9* 30.2  RDW 15.8* 16.0* 16.1*  LYMPHSABS 0.7  --   --   MONOABS 0.9  --   --   EOSABS 0.0  --   --   BASOSABS 0.0  --   --     Chemistries  Recent Labs  Lab 11/28/18 1312 11/29/18 0630 12/01/18 0506  NA 140 144 144  K 4.6 3.5 3.9  CL 102 104 101  CO2 25 27 32  GLUCOSE 104*  112* 117*  BUN 65* 56* 32*  CREATININE 3.13* 2.62* 1.76*  CALCIUM 9.2 8.4* 8.3*  AST 100*  --   --   ALT 7  --   --   ALKPHOS 84  --   --   BILITOT 1.4*  --   --    ------------------------------------------------------------------------------------------------------------------ No results for input(s): CHOL, HDL, LDLCALC, TRIG, CHOLHDL, LDLDIRECT in the last 72 hours.  No results found for: HGBA1C ------------------------------------------------------------------------------------------------------------------ No results for input(s): TSH, T4TOTAL, T3FREE, THYROIDAB in the last 72 hours.  Invalid input(s):  FREET3 ------------------------------------------------------------------------------------------------------------------ No results for input(s): VITAMINB12, FOLATE, FERRITIN, TIBC, IRON, RETICCTPCT in the last 72 hours.  Coagulation profile Recent Labs  Lab 11/28/18 1312 11/30/18 1426 12/01/18 0506  INR 1.2 1.4* 1.4*    No results for input(s): DDIMER in the last 72 hours.  Cardiac Enzymes No results for input(s): CKMB, TROPONINI, MYOGLOBIN in the last 168 hours.  Invalid input(s): CK ------------------------------------------------------------------------------------------------------------------    Component Value Date/Time   BNP 14.0 11/22/2018 1427   Jair Lindblad M.D on 12/01/2018 at 1:54 PM  Go to [email or web address] - for contact info  [facility] - Office  [phone]

## 2018-12-01 NOTE — Care Management Important Message (Signed)
Important Message  Patient Details  Name: [name] MRN: [id number] Date of Birth: [birth year]   Medicare Important Message Given:  Yes     Tommy Medal 12/01/2018, 1:32 PM

## 2018-12-01 NOTE — Progress Notes (Signed)
Physical Therapy Treatment Patient Details Name: [name] MRN: [id number] DOB: Jul 15, 1950 Today's Date: 12/01/2018    History of Present Illness 68 year old male with history of dementia, Parkinson's, schizophrenia, mental retardation, recently diagnosed with right lower extremity DVT on 11/22/2018. He is a resident at [facility] brought here via EMS for evaluation of weakness.  Patient is a poor historian.  From prior note from 4 days ago when patient was seen in the ED 9/28 and 11/23/2018. Patient has a known right lower extremity DVT and was  having trouble obtaining his Xarelto anticoagulant medication going unprotected for several days.  He also has generalized weakness and recurrent falls.  He was evaluated and it was felt that his difficulty walking is due to his right lower extremity DVT.  He received IV fluid and was able to tolerate p.o. and subsequently discharged home with social work and case management involvement to help with Xarelto.  Today I was able to talk to patient's caregiver at the facility via the phone.  Patient did show some mild improvement when he was discharged from the ER however within the past 2 days he has been not himself.  He is weak, unable to get up without assistance, unable to ambulate or care for himself.  He slumped over and is not eating which is unusual for him.  He does not specifically complain of anything but due to his progressive decline in his strength and mental status, staff felt patient will need to evaluate further and arranged for transport to [facility]    PT Comments    Pt supine and willing to participate with therapy today.  Noted bowel movement in bed, NT aware and pt/bed cleaned.  Pt with mod I bed mobility, able to complete I with cueing for mechanics and hand placement to assist, very slow.  Pt able to ambulate further distance, slow labored cadence with RW, no LOB episodes.  Pt did insist on carrying black bag during gait.   EOS pt left in chair with call bell within reach, chair alarm set and feet reclined.  RN aware of status.    Follow Up Recommendations  Home health PT;Supervision for mobility/OOB;Supervision/Assistance - 24 hour     Equipment Recommendations  None recommended by PT    Recommendations for Other Services       Precautions / Restrictions Precautions Precautions: Fall Restrictions Weight Bearing Restrictions: No    Mobility  Bed Mobility Overal bed mobility: Modified Independent Bed Mobility: Supine to Sit           General bed mobility comments: slow labored movement  Transfers Overall transfer level: Needs assistance Equipment used: Rolling walker (2 wheeled) Transfers: Sit to/from Stand Sit to Stand: Min assist         General transfer comment: Pt agreeable to use RW  Ambulation/Gait Ambulation/Gait assistance: Min assist Gait Distance (Feet): 35 Feet Assistive device: Rolling walker (2 wheeled) Gait Pattern/deviations: Decreased step length - right;Decreased stride length;Narrow base of support Gait velocity: decreased   General Gait Details: slow laborder shuffled cadence with NBOS and short stride length.  Stood properly within [employer] (Stroke Patients Only)       Balance  Cognition Arousal/Alertness: Awake/alert Behavior During Therapy: WFL for tasks assessed/performed Overall Cognitive Status: History of cognitive impairments - at baseline                                 General Comments: Difficult to understand speech, able to point and respond to assist with message.  Attached to brown bag.      Exercises      General Comments        Pertinent Vitals/Pain Pain Assessment: No/denies pain    Home Living                      Prior Function            PT Goals (current goals can now  be found in the care plan section)      Frequency    Min 3X/week      PT Plan Current plan remains appropriate    Co-evaluation              AM-PAC PT "6 Clicks" Mobility   Outcome Measure  Help needed turning from your back to your side while in a flat bed without using bedrails?: A Little Help needed moving from lying on your back to sitting on the side of a flat bed without using bedrails?: A Little Help needed moving to and from a bed to a chair (including a wheelchair)?: A Little Help needed standing up from a chair using your arms (e.g., wheelchair or bedside chair)?: A Little Help needed to walk in hospital room?: A Lot Help needed climbing 3-5 steps with a railing? : A Lot 6 Click Score: 16    End of Session Equipment Utilized During Treatment: Gait belt Activity Tolerance: Patient tolerated treatment well;Patient limited by fatigue Patient left: in chair;with call bell/phone within reach;with chair alarm set Nurse Communication: Mobility status PT Visit Diagnosis: Unsteadiness on feet (R26.81);Other abnormalities of gait and mobility (R26.89);Muscle weakness (generalized) (M62.81)     Time: 1420-1450 PT Time Calculation (min) (ACUTE ONLY): 30 min  Charges:  $Therapeutic Activity: 23-37 mins                     [address] LPTA; CBIS [phone]   Aldona Lento 12/01/2018, 4:11 PM

## 2018-12-02 LAB — RENAL FUNCTION PANEL
Albumin: 2.6 g/dL — ABNORMAL LOW (ref 3.5–5.0)
Anion gap: 10 (ref 5–15)
BUN: 25 mg/dL — ABNORMAL HIGH (ref 8–23)
CO2: 32 mmol/L (ref 22–32)
Calcium: 8.4 mg/dL — ABNORMAL LOW (ref 8.9–10.3)
Chloride: 102 mmol/L (ref 98–111)
Creatinine, Ser: 1.63 mg/dL — ABNORMAL HIGH (ref 0.61–1.24)
GFR calc Af Amer: 49 mL/min — ABNORMAL LOW (ref >60)
GFR calc non Af Amer: 43 mL/min — ABNORMAL LOW (ref >60)
Glucose, Bld: 105 mg/dL — ABNORMAL HIGH (ref 70–99)
Phosphorus: 2.6 mg/dL (ref 2.5–4.6)
Potassium: 4 mmol/L (ref 3.5–5.1)
Sodium: 144 mmol/L (ref 135–145)

## 2018-12-02 LAB — PROTIME-INR
INR: 1.4 — ABNORMAL HIGH (ref 0.8–1.2)
Prothrombin Time: 16.6 seconds — ABNORMAL HIGH (ref 11.4–15.2)

## 2018-12-02 MED ORDER — CEPHALEXIN 500 MG PO CAPS
500.0000 mg | ORAL_CAPSULE | Freq: Three times a day (TID) | ORAL | Status: DC
  Administered 2018-12-02 – 2018-12-04 (×7): 500 mg via ORAL
  Filled 2018-12-02 (×7): qty 1

## 2018-12-02 MED ORDER — WARFARIN SODIUM 5 MG PO TABS
10.0000 mg | ORAL_TABLET | Freq: Once | ORAL | Status: AC
  Administered 2018-12-02: 17:00:00 10 mg via ORAL
  Filled 2018-12-02: qty 2

## 2018-12-02 NOTE — Progress Notes (Signed)
ANTICOAGULATION CONSULT NOTE -   Pharmacy Consult for  Warfarin dosing Indication: VTE treatment  Allergies  Allergen Reactions  . Penicillins     .Did it involve swelling of the face/tongue/throat, SOB, or low BP? Unknown Did it involve sudden or severe rash/hives, skin peeling, or any reaction on the inside of your mouth or nose? Unknown Did you need to seek medical attention at a hospital or doctor's office? Unknown When did it last happen?Unknown If all above answers are "NO", may proceed with cephalosporin use.     Patient Measurements: Height: 6' (182.9 cm) Weight: 188 lb 15 oz (85.7 kg) IBW/kg (Calculated) : 77.6 Heparin Dosing Weight: HEPARIN DW (KG): 85.7  Vital Signs: Temp: 98.3 F (36.8 C) (10/08 0505) BP: 99/79 (10/08 0505) Pulse Rate: 71 (10/08 0505)  Labs: Recent Labs    11/30/18 1426 12/01/18 0506 12/02/18 0742 12/02/18 0800  HGB  --  11.3*  --   --   HCT  --  37.4*  --   --   PLT  --  168  --   --   LABPROT 17.0* 16.9* 16.6*  --   INR 1.4* 1.4* 1.4*  --   CREATININE  --  1.76*  --  1.63*    Estimated Creatinine Clearance: 47.6 mL/min (A) (by C-G formula based on SCr of 1.63 mg/dL (H)).   Medical History: Past Medical History:  Diagnosis Date  . Cellulitis   . Dementia ([place])   . Edema   . Hypertension   . Hypoglycemia   . Mental retardation   . Parkinson's disease ([place])   . Psychosis ([place])   . Renal insufficiency       Assessment: Pharmacy consulted to dose warfarin for this 68 yo male patient currently on apixaban 5mg  bid for VTE treatment.  Patient is  unable to afford apixaban therapy as an outpatient, so warfarin will be started with Lovenox bridge until INR is  Therapeutic.  INR 1.4   Goal of Therapy:  INR 2-3 Monitor platelets by anticoagulation protocol: Yes   Plan:  Give warfarin  10mg  po x1 dose tonight Lovenox to 90mg  sub-q q12h  CBC ordered qM-W-F Daily PT/INR Pharmacy to monitor renal fx and signs/symptoms of  bleeding.   Despina Pole, Pharm. D. Clinical Pharmacist 12/02/2018 10:36 AM

## 2018-12-02 NOTE — Plan of Care (Signed)
  Problem: Urinary Elimination: ?Goal: Signs and symptoms of infection will decrease ?Outcome: Progressing ?  ?Problem: Education: ?Goal: Knowledge of General Education information will improve ?Description: Including pain rating scale, medication(s)/side effects and non-pharmacologic comfort measures ?Outcome: Progressing ?  ?

## 2018-12-02 NOTE — Progress Notes (Signed)
Patient Demographics:    [name], is a 68 y.o. male, DOB - 08-23-1950, KDX:[id number]  Admit date - 11/28/2018   Admitting Physician Neena Rhymes, MD  Outpatient Primary MD for the patient is Rosita Fire, MD  LOS - 4   Chief Complaint  Patient presents with   Weakness        Subjective:    [name] today has no fevers, no emesis,  No chest pain,   no new concerns  Assessment  & Plan :    Active Problems:   Schizophrenia ([place])   Dementia ([place])   Sepsis secondary to UTI ([place])   DVT (deep venous thrombosis) ([facility])   AKI (acute kidney injury) ([place])   1) E. coli bacteremia and p E. coli UTI and sepsis--- -no fevers,  -WBC down to 10.7 from 16.7  treated with IV meropenem initially with concerns about possible ESBL infection, however urine and blood cultures from 11/28/2018 with mostly pansensitive E. coli, IV Rocephin 2 g x 1 dose on 12/01/2018 then switched to p.o. Keflex starting 12/02/2018 to complete at least 10 days of antibiotic therapy   2) DVT - patient with DVT by LE venous u/s 11/22/18  right posterior tibial vein with thrombophlebitis right great saphenous vein. Was unable to afford Xarelto and went unprotected for several days.  -V/Q negative,  -Due to cost concerns transition to Coumadin with Lovenox bridge -HH RN may be able to provide Lovenox injections for patient at his -group home post discharge  3)AKI----acute kidney injury due to presumed UTI and dehydration    creatinine on admission=3.1  ,   baseline creatinine = 1.2 (10/14/2018)   , creatinine is now= 1.63    , renally adjust medications, avoid nephrotoxic agents / dehydration  / hypotension -Renal function has improved with hydration  4.  Dementia/depressive disorder-stable, c/n buspirone, clonazepam, Clozaril, on Aricept  5. Parkinson's -stable, continue Sinemet  6. Dispo -   - long term  resident at the group home PT eval appreciated recommends home health -HH RN and [facility] PT with [facility] was discharged  Disposition/Need for in-Hospital Stay- patient unable to be discharged at this time due to requiring IV antibiotics for E. coli  bacteremia and sepsis  -Plan to discharge back to group home when INR therapeutic currently on Lovenox bridge -HH RN and HH PT with [facility] at group home  Code Status : full  Family Communication:   Left message [name] [phone]  Disposition Plan  : hh at group home  Consults  :  na  DVT Prophylaxis  :  Lovenox --bridging for Coumadin  Lab Results  Component Value Date   PLT 168 12/01/2018    Inpatient Medications  Scheduled Meds:  busPIRone  15 mg Oral BID   carbidopa-levodopa  2 tablet Oral QID   cephALEXin  500 mg Oral TID   chlorhexidine  15 mL Mouth Rinse BID   clonazepam  0.25 mg Oral Daily   cloZAPine  75 mg Oral QHS   docusate sodium  100 mg Oral BID   donepezil  10 mg Oral Daily   enoxaparin (LOVENOX) injection  90 mg Subcutaneous Q12H   mouth rinse  15 mL Mouth Rinse q12n4p  polyethylene glycol  17 g Oral Daily   potassium chloride SA  40 mEq Oral BID   rasagiline  1 mg Oral Daily   tamsulosin  0.4 mg Oral Daily   torsemide  40 mg Oral BID   Warfarin - Pharmacist Dosing Inpatient   Does not apply q1800   Continuous Infusions:  dextrose 5 % and 0.45% NaCl 75 mL/hr at 12/02/18 0912   PRN Meds:.acetaminophen    Anti-infectives (From admission, onward)   Start     Dose/Rate Route Frequency Ordered Stop   12/02/18 1000  cephALEXin (KEFLEX) capsule 500 mg  Status:  Discontinued     500 mg Oral Every 12 hours 12/01/18 0802 12/02/18 0737   12/02/18 1000  cephALEXin (KEFLEX) capsule 500 mg     500 mg Oral 3 times daily 12/02/18 0737     12/01/18 1400  cefTRIAXone (ROCEPHIN) 1 g in sodium chloride 0.9 % 100 mL IVPB     1 g 200 mL/hr over 30 Minutes Intravenous  Once 12/01/18 1357 12/01/18 1709    12/01/18 0815  cefTRIAXone (ROCEPHIN) 1 g in sodium chloride 0.9 % 100 mL IVPB     1 g 200 mL/hr over 30 Minutes Intravenous  Once 12/01/18 0801 12/01/18 1049   11/29/18 1000  meropenem (MERREM) 1 g in sodium chloride 0.9 % 100 mL IVPB  Status:  Discontinued     1 g 200 mL/hr over 30 Minutes Intravenous Every 12 hours 11/29/18 0824 12/01/18 1355   11/28/18 1830  ciprofloxacin (CIPRO) tablet 500 mg  Status:  Discontinued     500 mg Oral Daily-1800 11/28/18 1746 11/29/18 0824   11/28/18 1500  cefTRIAXone (ROCEPHIN) 1 g in sodium chloride 0.9 % 100 mL IVPB     1 g 200 mL/hr over 30 Minutes Intravenous  Once 11/28/18 1447 11/28/18 1639        Objective:   Vitals:   12/01/18 2112 12/02/18 0505 12/02/18 1329 12/02/18 1400  BP: (!) 93/58 99/79 102/60 111/65  Pulse: 68 71 71 74  Resp: 17 16 17 16   Temp: 98.5 F (36.9 C) 98.3 F (36.8 C) 98.1 F (36.7 C) 98.4 F (36.9 C)  TempSrc:    Oral  SpO2: 99% 100% 99%   Weight:      Height:        Wt Readings from Last 3 Encounters:  11/28/18 85.7 kg  11/23/18 85.7 kg  11/22/18 85.7 kg     Intake/Output Summary (Last 24 hours) at 12/02/2018 1731 Last data filed at 12/02/2018 1609 Gross per 24 hour  Intake 3089.73 ml  Output 100 ml  Net 2989.73 ml     Physical Exam  Gen:- Awake Alert,  In no apparent distress  HEENT:- [place].AT, No sclera icterus Neck-Supple Neck,No JVD,.  Lungs-  CTAB , fair symmetrical air movement CV- S1, S2 normal, regular  Abd-  +ve B.Sounds, Abd Soft, No tenderness, no CVA area tenderness Extremity/Skin:-Bilateral lower extremity swelling right more than left, pedal pulses present Psych-affect is appropriate, oriented x3 (episodes of confusion and disorientation) Neuro-generalized weakness no new focal deficits, no tremors   Data Review:   Micro Results Recent Results (from the past 240 hour(s))  Urine Culture     Status: Abnormal   Collection Time: 11/28/18  1:12 PM   Specimen: Urine, Clean Catch    Result Value Ref Range Status   Specimen Description   Final    URINE, CLEAN CATCH Performed at [facility], [place]  [address], [place], [place] [address]    Special Requests   Final    NONE Performed at [facility], [address] [place], [place] [address]    Culture >=100,000 COLONIES/mL ESCHERICHIA COLI (A)  Final   Report Status 11/30/2018 FINAL  Final   Organism ID, Bacteria ESCHERICHIA COLI (A)  Final      Susceptibility   Escherichia coli - MIC*    AMPICILLIN >=32 RESISTANT Resistant     CEFAZOLIN <=4 SENSITIVE Sensitive     CEFTRIAXONE <=1 SENSITIVE Sensitive     CIPROFLOXACIN <=0.25 SENSITIVE Sensitive     GENTAMICIN <=1 SENSITIVE Sensitive     IMIPENEM <=0.25 SENSITIVE Sensitive     NITROFURANTOIN <=16 SENSITIVE Sensitive     TRIMETH/SULFA <=20 SENSITIVE Sensitive     AMPICILLIN/SULBACTAM 4 SENSITIVE Sensitive     PIP/TAZO <=4 SENSITIVE Sensitive     Extended ESBL NEGATIVE Sensitive     * >=100,000 COLONIES/mL ESCHERICHIA COLI  SARS CORONAVIRUS 2 (TAT 6-24 HRS) Nasopharyngeal Nasopharyngeal Swab     Status: None   Collection Time: 11/28/18  1:40 PM   Specimen: Nasopharyngeal Swab  Result Value Ref Range Status   SARS Coronavirus 2 NEGATIVE NEGATIVE Final    Comment: (NOTE) SARS-CoV-2 target nucleic acids are NOT DETECTED. The SARS-CoV-2 RNA is generally detectable in upper and lower respiratory specimens during the acute phase of infection. Negative results do not preclude SARS-CoV-2 infection, do not rule out co-infections with other pathogens, and should not be used as the sole basis for treatment or other patient management decisions. Negative results must be combined with clinical observations, patient history, and epidemiological information. The expected result is Negative. Fact Sheet for Patients: SugarRoll.be Fact Sheet for Healthcare Providers: https://www.woods-mathews.com/ This test is not yet approved or  cleared by the [place] FDA and  has been authorized for detection and/or diagnosis of SARS-CoV-2 by FDA under an Emergency Use Authorization (EUA). This EUA will remain  in effect (meaning this test can be used) for the duration of the COVID-19 declaration under Section 56 4(b)(1) of the Act, 21 U.S.C. section 360bbb-3(b)(1), unless the authorization is terminated or revoked sooner. Performed at [facility], [place] [address] [place], [place] [address]   Blood Culture (routine x 2)     Status: Abnormal   Collection Time: 11/28/18  3:50 PM   Specimen: BLOOD LEFT HAND  Result Value Ref Range Status   Specimen Description   Final    BLOOD LEFT HAND Performed at [facility], [address], [place], [place] [address]    Special Requests   Final    BOTTLES DRAWN AEROBIC AND ANAEROBIC Blood Culture adequate volume Performed at [facility], [address], [place], [place] [address]    Culture  Setup Time   Final    GRAM NEGATIVE RODS STYPA,N @ 0428 ON 11/29/18 BY JUW GS DONE @ [facility] IN BOTH AEROBIC AND ANAEROBIC BOTTLES    Culture ESCHERICHIA COLI (A)  Final   Report Status 12/01/2018 FINAL  Final   Organism ID, Bacteria ESCHERICHIA COLI  Final      Susceptibility   Escherichia coli - MIC*    AMPICILLIN >=32 RESISTANT Resistant     CEFAZOLIN <=4 SENSITIVE Sensitive     CEFEPIME <=1 SENSITIVE Sensitive     CEFTAZIDIME <=1 SENSITIVE Sensitive     CEFTRIAXONE <=1 SENSITIVE Sensitive     CIPROFLOXACIN <=0.25 SENSITIVE Sensitive     GENTAMICIN <=1 SENSITIVE Sensitive  IMIPENEM <=0.25 SENSITIVE Sensitive     TRIMETH/SULFA <=20 SENSITIVE Sensitive     AMPICILLIN/SULBACTAM 4 SENSITIVE Sensitive     PIP/TAZO <=4 SENSITIVE Sensitive     Extended ESBL NEGATIVE Sensitive     * ESCHERICHIA COLI  Blood Culture ID Panel (Reflexed)     Status: Abnormal   Collection Time: 11/28/18  3:50 PM  Result Value Ref Range Status   Enterococcus species NOT DETECTED NOT DETECTED Final    Listeria monocytogenes NOT DETECTED NOT DETECTED Final   Staphylococcus species NOT DETECTED NOT DETECTED Final   Staphylococcus aureus (BCID) NOT DETECTED NOT DETECTED Final   Streptococcus species NOT DETECTED NOT DETECTED Final   Streptococcus agalactiae NOT DETECTED NOT DETECTED Final   Streptococcus pneumoniae NOT DETECTED NOT DETECTED Final   Streptococcus pyogenes NOT DETECTED NOT DETECTED Final   Acinetobacter baumannii NOT DETECTED NOT DETECTED Final   Enterobacteriaceae species DETECTED (A) NOT DETECTED Final    Comment: Enterobacteriaceae represent a large family of gram-negative bacteria, not a single organism. CRITICAL RESULT CALLED TO, READ BACK BY AND VERIFIED WITH: S. [place], AT [phone] 11/29/18 BY D. VANHOOK    Enterobacter cloacae complex NOT DETECTED NOT DETECTED Final   Escherichia coli DETECTED (A) NOT DETECTED Final    Comment: CRITICAL RESULT CALLED TO, READ BACK BY AND VERIFIED WITH: Gloris [place] PHARMD, AT [phone] 11/29/18 BY D. VANHOOK    Klebsiella oxytoca NOT DETECTED NOT DETECTED Final   Klebsiella pneumoniae NOT DETECTED NOT DETECTED Final   Proteus species NOT DETECTED NOT DETECTED Final   Serratia marcescens NOT DETECTED NOT DETECTED Final   Carbapenem resistance NOT DETECTED NOT DETECTED Final   Haemophilus influenzae NOT DETECTED NOT DETECTED Final   Neisseria meningitidis NOT DETECTED NOT DETECTED Final   Pseudomonas aeruginosa NOT DETECTED NOT DETECTED Final   Candida albicans NOT DETECTED NOT DETECTED Final   Candida glabrata NOT DETECTED NOT DETECTED Final   Candida krusei NOT DETECTED NOT DETECTED Final   Candida parapsilosis NOT DETECTED NOT DETECTED Final   Candida tropicalis NOT DETECTED NOT DETECTED Final    Comment: Performed at [facility], [place] [address], [place], [place] [address]  Blood Culture (routine x 2)     Status: None (Preliminary result)   Collection Time: 11/28/18  3:59 PM   Specimen: BLOOD RIGHT ARM  Result Value Ref Range  Status   Specimen Description BLOOD RIGHT ARM  Final   Special Requests   Final    BOTTLES DRAWN AEROBIC AND ANAEROBIC Blood Culture adequate volume   Culture   Final    NO GROWTH 4 DAYS Performed at [facility], [address], [place],  [address]    Report Status PENDING  Incomplete    Radiology Reports Dg Chest 2 View  Result Date: 11/23/2018 CLINICAL DATA:  Weakness. EXAM: CHEST - 2 VIEW COMPARISON:  Portable chest 10/13/2018. FINDINGS: Mediastinum and hilar structures normal. Stable elevation left hemidiaphragm with mild left base atelectasis. No acute infiltrate. No pleural effusion or pneumothorax. IMPRESSION: Stable elevation left hemidiaphragm with mild atelectasis left lung base. No acute abnormality identified. Electronically Signed   By: Marcello Moores  Register   On: 11/23/2018 14:01   Ct Head Wo Contrast  Result Date: 11/23/2018 CLINICAL DATA:  Altered mental status EXAM: CT HEAD WITHOUT CONTRAST TECHNIQUE: Contiguous axial images were obtained from the base of the skull through the vertex without intravenous contrast. COMPARISON:  None. FINDINGS: Brain: No evidence of acute infarction, hemorrhage, hydrocephalus, extra-axial collection  or mass lesion/mass effect. Scattered low-density changes within the periventricular and subcortical white matter compatible with chronic microvascular ischemic change. Mild diffuse cerebral volume loss. Vascular: Mild atherosclerotic calcifications involving the large vessels of the skull base. No unexpected hyperdense vessel. Skull: Normal. Negative for fracture or focal lesion. Sinuses/Orbits: No acute finding. Other: None. IMPRESSION: No acute intracranial findings. Electronically Signed   By: Davina Poke M.D.   On: 11/23/2018 16:13   Nm Pulmonary Perfusion  Result Date: 11/28/2018 CLINICAL DATA:  PE suspected EXAM: NUCLEAR MEDICINE PERFUSION LUNG SCAN TECHNIQUE: Perfusion images were obtained in multiple projections after intravenous  injection of radiopharmaceutical. Ventilation scans intentionally deferred if perfusion scan and chest x-ray adequate for interpretation during COVID 19 epidemic. RADIOPHARMACEUTICALS:  1.6 mCi Tc-69m MAA IV COMPARISON:  Same day chest radiograph FINDINGS: Normal, homogeneous pulmonary perfusion on diffusion only imaging. No suspicious perfusion defect. IMPRESSION: Normal pulmonary perfusion scan.  No evidence of pulmonary embolism. Electronically Signed   By: Eddie Candle M.D.   On: 11/28/2018 19:53   US Venous Img Lower Unilateral Right  Result Date: 11/22/2018 CLINICAL DATA:  Lower extremity edema and pain. EXAM: RIGHT LOWER EXTREMITY VENOUS DOPPLER ULTRASOUND TECHNIQUE: Gray-scale sonography with graded compression, as well as color Doppler and duplex ultrasound were performed to evaluate the lower extremity deep venous systems from the level of the common femoral vein and including the common femoral, femoral, profunda femoral, popliteal and calf veins including the posterior tibial, peroneal and gastrocnemius veins when visible. The superficial great saphenous vein was also interrogated. Spectral Doppler was utilized to evaluate flow at rest and with distal augmentation maneuvers in the common femoral, femoral and popliteal veins. COMPARISON:  September 13, 2011.  October 13, 2018. FINDINGS: Contralateral Common Femoral Vein: Respiratory phasicity is normal and symmetric with the symptomatic side. No evidence of thrombus. Normal compressibility. Common Femoral Vein: No evidence of thrombus. Normal compressibility, respiratory phasicity and response to augmentation. Saphenofemoral Junction: No evidence of thrombus. Normal compressibility and flow on color Doppler imaging. Profunda Femoral Vein: No evidence of thrombus. Normal compressibility and flow on color Doppler imaging. Femoral Vein: No evidence of thrombus. Normal compressibility, respiratory phasicity and response to augmentation. Popliteal Vein: No  evidence of thrombus. Normal compressibility, respiratory phasicity and response to augmentation. Calf Veins: There is nonocclusive thrombus in the posterior tibial vein. Superficial Great Saphenous Vein: There is thrombus within the great saphenous vein. Other Findings:  Nonspecific lower extremity edema is noted. IMPRESSION: 1. Study is positive for a nonocclusive DVT involving the posterior tibial vein on the right. 2. Superficial thrombophlebitis involving the distal great saphenous vein. 3. Lower extremity edema. Electronically Signed   By: Constance Holster M.D.   On: 11/22/2018 16:42   Dg Chest Portable 1 View  Result Date: 11/28/2018 CLINICAL DATA:  Chronic swelling in the legs. EXAM: PORTABLE CHEST 1 VIEW COMPARISON:  11/23/2018 FINDINGS: The heart size and mediastinal contours are within normal limits. Both lungs are clear. The visualized skeletal structures are unremarkable. IMPRESSION: No active disease. Electronically Signed   By: Van Clines M.D.   On: 11/28/2018 14:04     CBC Recent Labs  Lab 11/28/18 1312 11/29/18 0630 12/01/18 0506  WBC 16.7* 12.9* 10.7*  HGB 12.9* 11.9* 11.3*  HCT 42.1 39.8 37.4*  PLT 179 180 168  MCV 88.4 89.8 90.6  MCH 27.1 26.9 27.4  MCHC 30.6 29.9* 30.2  RDW 15.8* 16.0* 16.1*  LYMPHSABS 0.7  --   --   MONOABS 0.9  --   --  EOSABS 0.0  --   --   BASOSABS 0.0  --   --     Chemistries  Recent Labs  Lab 11/28/18 1312 11/29/18 0630 12/01/18 0506 12/02/18 0800  NA 140 144 144 144  K 4.6 3.5 3.9 4.0  CL 102 104 101 102  CO2 25 27 32 32  GLUCOSE 104* 112* 117* 105*  BUN 65* 56* 32* 25*  CREATININE 3.13* 2.62* 1.76* 1.63*  CALCIUM 9.2 8.4* 8.3* 8.4*  AST 100*  --   --   --   ALT 7  --   --   --   ALKPHOS 84  --   --   --   BILITOT 1.4*  --   --   --    ------------------------------------------------------------------------------------------------------------------ No results for input(s): CHOL, HDL, LDLCALC, TRIG, CHOLHDL,  LDLDIRECT in the last 72 hours.  No results found for: HGBA1C ------------------------------------------------------------------------------------------------------------------ No results for input(s): TSH, T4TOTAL, T3FREE, THYROIDAB in the last 72 hours.  Invalid input(s): FREET3 ------------------------------------------------------------------------------------------------------------------ No results for input(s): VITAMINB12, FOLATE, FERRITIN, TIBC, IRON, RETICCTPCT in the last 72 hours.  Coagulation profile Recent Labs  Lab 11/28/18 1312 11/30/18 1426 12/01/18 0506 12/02/18 0742  INR 1.2 1.4* 1.4* 1.4*    No results for input(s): DDIMER in the last 72 hours.  Cardiac Enzymes No results for input(s): CKMB, TROPONINI, MYOGLOBIN in the last 168 hours.  Invalid input(s): CK ------------------------------------------------------------------------------------------------------------------    Component Value Date/Time   BNP 14.0 11/22/2018 1427   Delio Slates M.D on 12/02/2018 at 5:31 PM  Go to [email or web address] - for contact info  [facility] - Office  [phone]

## 2018-12-02 NOTE — TOC Progression Note (Addendum)
Transition of Care [facility]) - Progression Note    Patient Details  Name: [name] MRN: [id number] Date of Birth: [birth year]  Transition of [facility] CM/SW Contact  Maddyx Vallie, Chauncey Reading, RN Phone Number: 12/02/2018, 11:42 AM  Clinical Narrative:   Updated  [name] on progress and plan for disposition. Plan will be for [facility] RN and [facility] PT with [facility].  Waiting on INR to get to an appropriate level for DC, as  [facility] RN will be able to administer Lovenox injections at home , but only for 2-3 days in a row.  Group home can not administer these injections.  Will need INR and BMET checks ordered for [facility] RN.  Will need f/u appt with Dr. Legrand Rams scheduled once ready for DC.   Beverly aware, Barbaraann Rondo [facility] colleague will follow up with her tomorrow if patient is discharged.     Expected Discharge Plan: [facility](with home health) Barriers to Discharge: Continued Medical Work up  Expected Discharge Plan and Services Expected Discharge Plan: [facility] Home(with home health)   Discharge Planning Services: CM Consult Post Acute Care Choice: [place] arrangements for the past 2 months: Group Home                                       Social Determinants of Health (SDOH) Interventions    Readmission Risk Interventions No flowsheet data found.

## 2018-12-03 LAB — PROTIME-INR
INR: 1.8 — ABNORMAL HIGH (ref 0.8–1.2)
Prothrombin Time: 20.7 seconds — ABNORMAL HIGH (ref 11.4–15.2)

## 2018-12-03 LAB — CBC
HCT: 36.3 % — ABNORMAL LOW (ref 39.0–52.0)
Hemoglobin: 10.9 g/dL — ABNORMAL LOW (ref 13.0–17.0)
MCH: 27.1 pg (ref 26.0–34.0)
MCHC: 30 g/dL (ref 30.0–36.0)
MCV: 90.3 fL (ref 80.0–100.0)
Platelets: 192 10*3/uL (ref 150–400)
RBC: 4.02 MIL/uL — ABNORMAL LOW (ref 4.22–5.81)
RDW: 15.8 % — ABNORMAL HIGH (ref 11.5–15.5)
WBC: 10.5 10*3/uL (ref 4.0–10.5)
nRBC: 0 % (ref 0.0–0.2)

## 2018-12-03 LAB — CULTURE, BLOOD (ROUTINE X 2)
Culture: NO GROWTH
Special Requests: ADEQUATE

## 2018-12-03 LAB — BASIC METABOLIC PANEL
Anion gap: 6 (ref 5–15)
BUN: 21 mg/dL (ref 8–23)
CO2: 34 mmol/L — ABNORMAL HIGH (ref 22–32)
Calcium: 8.1 mg/dL — ABNORMAL LOW (ref 8.9–10.3)
Chloride: 97 mmol/L — ABNORMAL LOW (ref 98–111)
Creatinine, Ser: 1.6 mg/dL — ABNORMAL HIGH (ref 0.61–1.24)
GFR calc Af Amer: 51 mL/min — ABNORMAL LOW (ref >60)
GFR calc non Af Amer: 44 mL/min — ABNORMAL LOW (ref >60)
Glucose, Bld: 99 mg/dL (ref 70–99)
Potassium: 3.7 mmol/L (ref 3.5–5.1)
Sodium: 137 mmol/L (ref 135–145)

## 2018-12-03 MED ORDER — CLOZAPINE 50 MG PO TABS: 50.0000 mg | ORAL_TABLET | Freq: Every day | ORAL | 0 refills | Status: AC

## 2018-12-03 MED ORDER — WARFARIN SODIUM 4 MG PO TABS: 4.0000 mg | ORAL_TABLET | ORAL | 0 refills | Status: DC

## 2018-12-03 MED ORDER — BUSPIRONE HCL 15 MG PO TABS: 15.0000 mg | ORAL_TABLET | Freq: Two times a day (BID) | ORAL | 2 refills | Status: AC

## 2018-12-03 MED ORDER — POTASSIUM CHLORIDE CRYS ER 20 MEQ PO TBCR: 20.0000 meq | EXTENDED_RELEASE_TABLET | Freq: Every day | ORAL | 0 refills | Status: DC

## 2018-12-03 MED ORDER — DONEPEZIL HCL 10 MG PO TABS: 10.0000 mg | ORAL_TABLET | Freq: Every day | ORAL | 2 refills | Status: DC

## 2018-12-03 MED ORDER — ACETAMINOPHEN 500 MG PO TABS: 500.0000 mg | ORAL_TABLET | Freq: Four times a day (QID) | ORAL | 0 refills | Status: AC | PRN

## 2018-12-03 MED ORDER — TAMSULOSIN HCL 0.4 MG PO CAPS: 0.4000 mg | ORAL_CAPSULE | Freq: Every day | ORAL | 3 refills | Status: DC

## 2018-12-03 MED ORDER — SENNOSIDES-DOCUSATE SODIUM 8.6-50 MG PO TABS: 2.0000 | ORAL_TABLET | Freq: Once | ORAL | Status: DC

## 2018-12-03 MED ORDER — SENNOSIDES-DOCUSATE SODIUM 8.6-50 MG PO TABS: 2.0000 | ORAL_TABLET | Freq: Every day | ORAL | 2 refills | Status: DC

## 2018-12-03 MED ORDER — WARFARIN SODIUM 7.5 MG PO TABS
7.5000 mg | ORAL_TABLET | Freq: Once | ORAL | Status: AC
  Administered 2018-12-03: 7.5 mg via ORAL
  Filled 2018-12-03: qty 1

## 2018-12-03 MED ORDER — CARBIDOPA-LEVODOPA 25-100 MG PO TABS: 2.0000 | ORAL_TABLET | Freq: Four times a day (QID) | ORAL | 2 refills | Status: AC

## 2018-12-03 MED ORDER — TORSEMIDE 20 MG PO TABS: 20.0000 mg | ORAL_TABLET | Freq: Every day | ORAL | 1 refills | Status: DC

## 2018-12-03 MED ORDER — MAGNESIUM OXIDE 400 MG PO TABS: 400.0000 mg | ORAL_TABLET | Freq: Two times a day (BID) | ORAL | 2 refills | Status: AC

## 2018-12-03 MED ORDER — CLONAZEPAM 0.5 MG PO TABS: 0.2500 mg | ORAL_TABLET | Freq: Every day | ORAL | 0 refills | Status: DC | PRN

## 2018-12-03 MED ORDER — RASAGILINE MESYLATE 1 MG PO TABS: 1.0000 mg | ORAL_TABLET | Freq: Every day | ORAL | 2 refills | Status: AC

## 2018-12-03 MED ORDER — CEPHALEXIN 500 MG PO CAPS: 500.0000 mg | ORAL_CAPSULE | Freq: Three times a day (TID) | ORAL | 0 refills | Status: AC

## 2018-12-03 MED ORDER — POLYETHYLENE GLYCOL 3350 17 GM/SCOOP PO POWD: 17.0000 g | Freq: Every day | ORAL | 0 refills | Status: DC

## 2018-12-03 NOTE — Progress Notes (Signed)
ANTICOAGULATION CONSULT NOTE -   Pharmacy Consult for  Warfarin dosing Indication: VTE treatment  Allergies  Allergen Reactions  . Penicillins     .Did it involve swelling of the face/tongue/throat, SOB, or low BP? Unknown Did it involve sudden or severe rash/hives, skin peeling, or any reaction on the inside of your mouth or nose? Unknown Did you need to seek medical attention at a hospital or doctor's office? Unknown When did it last happen?Unknown If all above answers are "NO", may proceed with cephalosporin use.     Patient Measurements: Height: 6' (182.9 cm) Weight: 188 lb 15 oz (85.7 kg) IBW/kg (Calculated) : 77.6 Heparin Dosing Weight: HEPARIN DW (KG): 85.7  Vital Signs: Temp: 97.8 F (36.6 C) (10/09 0537) Temp Source: Oral (10/09 0537) BP: 116/70 (10/09 0537) Pulse Rate: 65 (10/09 0537)  Labs: Recent Labs    12/01/18 0506 12/02/18 0742 12/02/18 0800 12/03/18 0507  HGB 11.3*  --   --  10.9*  HCT 37.4*  --   --  36.3*  PLT 168  --   --  192  LABPROT 16.9* 16.6*  --  20.7*  INR 1.4* 1.4*  --  1.8*  CREATININE 1.76*  --  1.63* 1.60*    Estimated Creatinine Clearance: 48.5 mL/min (A) (by C-G formula based on SCr of 1.6 mg/dL (H)).   Medical History: Past Medical History:  Diagnosis Date  . Cellulitis   . Dementia ([place])   . Edema   . Hypertension   . Hypoglycemia   . Mental retardation   . Parkinson's disease ([place])   . Psychosis ([place])   . Renal insufficiency       Assessment: Pharmacy consulted to dose warfarin for this 68 yo male patient currently on apixaban 5mg  bid for VTE treatment.  Patient is  unable to afford apixaban therapy as an outpatient, so warfarin will be started with Lovenox bridge until INR is  Therapeutic.  INR 1.8   Goal of Therapy:  INR 2-3 Monitor platelets by anticoagulation protocol: Yes   Plan Give warfarin 7.5 mg po x1 dose tonight Lovenox to 90mg  sub-q q12h  CBC ordered qM-W-F Daily PT/INR Pharmacy to  monitor renal fx and signs/symptoms of bleeding.  Margot Ables, PharmD Clinical Pharmacist 12/03/2018 10:51 AM

## 2018-12-03 NOTE — Progress Notes (Signed)
Patient Demographics:    [name], is a 68 y.o. male, DOB - 10-Nov-1950, ZOX:[id number]  Admit date - 11/28/2018   Admitting Physician Neena Rhymes, MD  Outpatient Primary MD for the patient is Rosita Fire, MD  LOS - 5   Chief Complaint  Patient presents with   Weakness        Subjective:    [name] today has no fevers, no emesis,  No chest pain,   no new concerns, oral intake is fair, voiding well  Assessment  & Plan :    Active Problems:   Schizophrenia ([place])   Dementia ([place])   Sepsis secondary to UTI ([place])   DVT (deep venous thrombosis) ([facility])   AKI (acute kidney injury) ([place])   1) E. coli bacteremia and p E. coli UTI and sepsis--- -Remains afebrile, -WBC down to 10.5 from 16.7  treated with IV meropenem initially with concerns about possible ESBL infection, however urine and blood cultures from 11/28/2018 with mostly pansensitive E. coli, IV Rocephin 2 g x 1 dose on 12/01/2018 then switched to p.o. Keflex starting 12/02/2018 to complete at least 10 days of antibiotic therapy   2) DVT - patient with DVT by LE venous u/s 11/22/18  right posterior tibial vein with thrombophlebitis right great saphenous vein. Was unable to afford Xarelto and went unprotected for several days.  -V/Q negative,  -Due to cost concerns transition to Coumadin with Lovenox bridge -HH RN may be able to monitor CBC INR at-group home post discharge -INR is 1.8,  3)AKI----acute kidney injury due to presumed UTI and dehydration    creatinine on admission=3.1  ,   baseline creatinine = 1.2 (10/14/2018)   , creatinine is now= 1.60    , renally adjust medications, avoid nephrotoxic agents / dehydration  / hypotension -Renal function has improved with hydration  4.  Dementia/depressive disorder-stable, c/n buspirone, clonazepam, Clozaril, on Aricept  5. Parkinson's -stable, continue Sinemet  6.  Dispo -   - long term resident at the group home PT eval appreciated recommends home health -[facility] RN and [facility] PT with [facility] was discharged  Disposition/Need for in-Hospital Stay- patient unable to be discharged at this time due to requiring IV antibiotics for E. coli  bacteremia and sepsis  -Plan to discharge back to group home when INR therapeutic currently on Lovenox bridge -HH RN and [facility] PT with [facility] at group home -Possible discharge back to group home with home health services on 12/04/2018 if INR is therapeutic  Code Status : full  Family Communication:    [name] [phone]  Disposition Plan  : hh at group home--Possible discharge back to group home with home health services on 12/04/2018 if INR is therapeutic  Consults  :  na  DVT Prophylaxis  :  Lovenox --bridging for Coumadin  Lab Results  Component Value Date   PLT 192 12/03/2018    Inpatient Medications  Scheduled Meds:  busPIRone  15 mg Oral BID   carbidopa-levodopa  2 tablet Oral QID   cephALEXin  500 mg Oral TID   chlorhexidine  15 mL Mouth Rinse BID   clonazepam  0.25 mg Oral Daily   cloZAPine  75 mg Oral QHS   donepezil  10  mg Oral Daily   enoxaparin (LOVENOX) injection  90 mg Subcutaneous Q12H   mouth rinse  15 mL Mouth Rinse q12n4p   polyethylene glycol  17 g Oral Daily   potassium chloride SA  40 mEq Oral BID   rasagiline  1 mg Oral Daily   senna-docusate  2 tablet Oral Once   tamsulosin  0.4 mg Oral Daily   torsemide  40 mg Oral BID   warfarin  7.5 mg Oral Once   Warfarin - Pharmacist Dosing Inpatient   Does not apply q1800   Continuous Infusions:  dextrose 5 % and 0.45% NaCl 20 mL/hr at 12/03/18 0811   PRN Meds:.acetaminophen    Anti-infectives (From admission, onward)   Start     Dose/Rate Route Frequency Ordered Stop   12/03/18 0000  cephALEXin (KEFLEX) 500 MG capsule     500 mg Oral 3 times daily 12/03/18 1213 12/10/18 2359   12/02/18 1000  cephALEXin (KEFLEX) capsule  500 mg  Status:  Discontinued     500 mg Oral Every 12 hours 12/01/18 0802 12/02/18 0737   12/02/18 1000  cephALEXin (KEFLEX) capsule 500 mg     500 mg Oral 3 times daily 12/02/18 0737     12/01/18 1400  cefTRIAXone (ROCEPHIN) 1 g in sodium chloride 0.9 % 100 mL IVPB     1 g 200 mL/hr over 30 Minutes Intravenous  Once 12/01/18 1357 12/01/18 1709   12/01/18 0815  cefTRIAXone (ROCEPHIN) 1 g in sodium chloride 0.9 % 100 mL IVPB     1 g 200 mL/hr over 30 Minutes Intravenous  Once 12/01/18 0801 12/01/18 1049   11/29/18 1000  meropenem (MERREM) 1 g in sodium chloride 0.9 % 100 mL IVPB  Status:  Discontinued     1 g 200 mL/hr over 30 Minutes Intravenous Every 12 hours 11/29/18 0824 12/01/18 1355   11/28/18 1830  ciprofloxacin (CIPRO) tablet 500 mg  Status:  Discontinued     500 mg Oral Daily-1800 11/28/18 1746 11/29/18 0824   11/28/18 1500  cefTRIAXone (ROCEPHIN) 1 g in sodium chloride 0.9 % 100 mL IVPB     1 g 200 mL/hr over 30 Minutes Intravenous  Once 11/28/18 1447 11/28/18 1639        Objective:   Vitals:   12/02/18 1329 12/02/18 1400 12/02/18 2145 12/03/18 0537  BP: 102/60 111/65 (!) 82/59 116/70  Pulse: 71 74 (!) 59 65  Resp: 17 16 16 16   Temp: 98.1 F (36.7 C) 98.4 F (36.9 C) 98.1 F (36.7 C) 97.8 F (36.6 C)  TempSrc:  Oral Oral Oral  SpO2: 99%  100% 100%  Weight:      Height:        Wt Readings from Last 3 Encounters:  11/28/18 85.7 kg  11/23/18 85.7 kg  11/22/18 85.7 kg     Intake/Output Summary (Last 24 hours) at 12/03/2018 1225 Last data filed at 12/03/2018 0900 Gross per 24 hour  Intake 1849 ml  Output 900 ml  Net 949 ml     Physical Exam  Gen:- Awake Alert, no acute distress , resting comfortably HEENT:- [place].AT, No sclera icterus Neck-Supple Neck,No JVD,.  Lungs-  CTAB , good air movement bilaterally  CV- S1, S2 normal, regular Abd-  +ve B.Sounds, Abd Soft, No tenderness,    Extremity/Skin:- No  edema,   good pulses Psych-affect is appropriate,  intermittent episodes of confusion and disorientation Neuro-generalized weakness, no new focal deficits, occasional tremors  Data Review:   Micro Results Recent Results (from the past 240 hour(s))  Urine Culture     Status: Abnormal   Collection Time: 11/28/18  1:12 PM   Specimen: Urine, Clean Catch  Result Value Ref Range Status   Specimen Description   Final    URINE, CLEAN CATCH Performed at [facility], [address], [place], [place] [address]    Special Requests   Final    NONE Performed at [facility], [address] [place], [place] [address]    Culture >=100,000 COLONIES/mL ESCHERICHIA COLI (A)  Final   Report Status 11/30/2018 FINAL  Final   Organism ID, Bacteria ESCHERICHIA COLI (A)  Final      Susceptibility   Escherichia coli - MIC*    AMPICILLIN >=32 RESISTANT Resistant     CEFAZOLIN <=4 SENSITIVE Sensitive     CEFTRIAXONE <=1 SENSITIVE Sensitive     CIPROFLOXACIN <=0.25 SENSITIVE Sensitive     GENTAMICIN <=1 SENSITIVE Sensitive     IMIPENEM <=0.25 SENSITIVE Sensitive     NITROFURANTOIN <=16 SENSITIVE Sensitive     TRIMETH/SULFA <=20 SENSITIVE Sensitive     AMPICILLIN/SULBACTAM 4 SENSITIVE Sensitive     PIP/TAZO <=4 SENSITIVE Sensitive     Extended ESBL NEGATIVE Sensitive     * >=100,000 COLONIES/mL ESCHERICHIA COLI  SARS CORONAVIRUS 2 (TAT 6-24 HRS) Nasopharyngeal Nasopharyngeal Swab     Status: None   Collection Time: 11/28/18  1:40 PM   Specimen: Nasopharyngeal Swab  Result Value Ref Range Status   SARS Coronavirus 2 NEGATIVE NEGATIVE Final    Comment: (NOTE) SARS-CoV-2 target nucleic acids are NOT DETECTED. The SARS-CoV-2 RNA is generally detectable in upper and lower respiratory specimens during the acute phase of infection. Negative results do not preclude SARS-CoV-2 infection, do not rule out co-infections with other pathogens, and should not be used as the sole basis for treatment or other patient management decisions. Negative results must  be combined with clinical observations, patient history, and epidemiological information. The expected result is Negative. Fact Sheet for Patients: SugarRoll.be Fact Sheet for Healthcare Providers: https://www.woods-mathews.com/ This test is not yet approved or cleared by the [place] FDA and  has been authorized for detection and/or diagnosis of SARS-CoV-2 by FDA under an Emergency Use Authorization (EUA). This EUA will remain  in effect (meaning this test can be used) for the duration of the COVID-19 declaration under Section 56 4(b)(1) of the Act, 21 U.S.C. section 360bbb-3(b)(1), unless the authorization is terminated or revoked sooner. Performed at [facility], [place] [address] [place], [place] [address]   Blood Culture (routine x 2)     Status: Abnormal   Collection Time: 11/28/18  3:50 PM   Specimen: BLOOD LEFT HAND  Result Value Ref Range Status   Specimen Description   Final    BLOOD LEFT HAND Performed at [facility], [address] [place], [place] [address]    Special Requests   Final    BOTTLES DRAWN AEROBIC AND ANAEROBIC Blood Culture adequate volume Performed at [facility], [address], [place], [place] [address]    Culture  Setup Time   Final    GRAM NEGATIVE RODS STYPA,N @ 0428 ON 11/29/18 BY JUW GS DONE @ [facility] IN BOTH AEROBIC AND ANAEROBIC BOTTLES    Culture ESCHERICHIA COLI (A)  Final   Report Status 12/01/2018 FINAL  Final   Organism ID, Bacteria ESCHERICHIA COLI  Final      Susceptibility   Escherichia  coli - MIC*    AMPICILLIN >=32 RESISTANT Resistant     CEFAZOLIN <=4 SENSITIVE Sensitive     CEFEPIME <=1 SENSITIVE Sensitive     CEFTAZIDIME <=1 SENSITIVE Sensitive     CEFTRIAXONE <=1 SENSITIVE Sensitive     CIPROFLOXACIN <=0.25 SENSITIVE Sensitive     GENTAMICIN <=1 SENSITIVE Sensitive     IMIPENEM <=0.25 SENSITIVE Sensitive     TRIMETH/SULFA <=20 SENSITIVE Sensitive     AMPICILLIN/SULBACTAM  4 SENSITIVE Sensitive     PIP/TAZO <=4 SENSITIVE Sensitive     Extended ESBL NEGATIVE Sensitive     * ESCHERICHIA COLI  Blood Culture ID Panel (Reflexed)     Status: Abnormal   Collection Time: 11/28/18  3:50 PM  Result Value Ref Range Status   Enterococcus species NOT DETECTED NOT DETECTED Final   Listeria monocytogenes NOT DETECTED NOT DETECTED Final   Staphylococcus species NOT DETECTED NOT DETECTED Final   Staphylococcus aureus (BCID) NOT DETECTED NOT DETECTED Final   Streptococcus species NOT DETECTED NOT DETECTED Final   Streptococcus agalactiae NOT DETECTED NOT DETECTED Final   Streptococcus pneumoniae NOT DETECTED NOT DETECTED Final   Streptococcus pyogenes NOT DETECTED NOT DETECTED Final   Acinetobacter baumannii NOT DETECTED NOT DETECTED Final   Enterobacteriaceae species DETECTED (A) NOT DETECTED Final    Comment: Enterobacteriaceae represent a large family of gram-negative bacteria, not a single organism. CRITICAL RESULT CALLED TO, READ BACK BY AND VERIFIED WITH: S. [place], AT [phone] 11/29/18 BY D. VANHOOK    Enterobacter cloacae complex NOT DETECTED NOT DETECTED Final   Escherichia coli DETECTED (A) NOT DETECTED Final    Comment: CRITICAL RESULT CALLED TO, READ BACK BY AND VERIFIED WITH: Gloris Manchester PHARMD, AT [phone] 11/29/18 BY D. VANHOOK    Klebsiella oxytoca NOT DETECTED NOT DETECTED Final   Klebsiella pneumoniae NOT DETECTED NOT DETECTED Final   Proteus species NOT DETECTED NOT DETECTED Final   Serratia marcescens NOT DETECTED NOT DETECTED Final   Carbapenem resistance NOT DETECTED NOT DETECTED Final   Haemophilus influenzae NOT DETECTED NOT DETECTED Final   Neisseria meningitidis NOT DETECTED NOT DETECTED Final   Pseudomonas aeruginosa NOT DETECTED NOT DETECTED Final   Candida albicans NOT DETECTED NOT DETECTED Final   Candida glabrata NOT DETECTED NOT DETECTED Final   Candida krusei NOT DETECTED NOT DETECTED Final   Candida parapsilosis NOT DETECTED NOT DETECTED  Final   Candida tropicalis NOT DETECTED NOT DETECTED Final    Comment: Performed at [facility], [place] [address] [place], [place] [address]  Blood Culture (routine x 2)     Status: None   Collection Time: 11/28/18  3:59 PM   Specimen: BLOOD RIGHT ARM  Result Value Ref Range Status   Specimen Description BLOOD RIGHT ARM  Final   Special Requests   Final    BOTTLES DRAWN AEROBIC AND ANAEROBIC Blood Culture adequate volume   Culture   Final    NO GROWTH 5 DAYS Performed at [facility], [address], [place], [place] [address]    Report Status 12/03/2018 FINAL  Final    Radiology Reports Dg Chest 2 View  Result Date: 11/23/2018 CLINICAL DATA:  Weakness. EXAM: CHEST - 2 VIEW COMPARISON:  Portable chest 10/13/2018. FINDINGS: Mediastinum and hilar structures normal. Stable elevation left hemidiaphragm with mild left base atelectasis. No acute infiltrate. No pleural effusion or pneumothorax. IMPRESSION: Stable elevation left hemidiaphragm with mild atelectasis left lung base. No acute abnormality identified. Electronically Signed   By:  [place]   On: 11/23/2018 14:01   Ct Head Wo Contrast  Result Date: 11/23/2018 CLINICAL DATA:  Altered mental status EXAM: CT HEAD WITHOUT CONTRAST TECHNIQUE: Contiguous axial images were obtained from the base of the skull through the vertex without intravenous contrast. COMPARISON:  None. FINDINGS: Brain: No evidence of acute infarction, hemorrhage, hydrocephalus, extra-axial collection or mass lesion/mass effect. Scattered low-density changes within the periventricular and subcortical white matter compatible with chronic microvascular ischemic change. Mild diffuse cerebral volume loss. Vascular: Mild atherosclerotic calcifications involving the large vessels of the skull base. No unexpected hyperdense vessel. Skull: Normal. Negative for fracture or focal lesion. Sinuses/Orbits: No acute finding. Other: None. IMPRESSION: No acute intracranial  findings. Electronically Signed   By: Davina Poke M.D.   On: 11/23/2018 16:13   Nm Pulmonary Perfusion  Result Date: 11/28/2018 CLINICAL DATA:  PE suspected EXAM: NUCLEAR MEDICINE PERFUSION LUNG SCAN TECHNIQUE: Perfusion images were obtained in multiple projections after intravenous injection of radiopharmaceutical. Ventilation scans intentionally deferred if perfusion scan and chest x-ray adequate for interpretation during COVID 19 epidemic. RADIOPHARMACEUTICALS:  1.6 mCi Tc-51m MAA IV COMPARISON:  Same day chest radiograph FINDINGS: Normal, homogeneous pulmonary perfusion on diffusion only imaging. No suspicious perfusion defect. IMPRESSION: Normal pulmonary perfusion scan.  No evidence of pulmonary embolism. Electronically Signed   By: Eddie Candle M.D.   On: 11/28/2018 19:53   US Venous Img Lower Unilateral Right  Result Date: 11/22/2018 CLINICAL DATA:  Lower extremity edema and pain. EXAM: RIGHT LOWER EXTREMITY VENOUS DOPPLER ULTRASOUND TECHNIQUE: Gray-scale sonography with graded compression, as well as color Doppler and duplex ultrasound were performed to evaluate the lower extremity deep venous systems from the level of the common femoral vein and including the common femoral, femoral, profunda femoral, popliteal and calf veins including the posterior tibial, peroneal and gastrocnemius veins when visible. The superficial great saphenous vein was also interrogated. Spectral Doppler was utilized to evaluate flow at rest and with distal augmentation maneuvers in the common femoral, femoral and popliteal veins. COMPARISON:  September 13, 2011.  October 13, 2018. FINDINGS: Contralateral Common Femoral Vein: Respiratory phasicity is normal and symmetric with the symptomatic side. No evidence of thrombus. Normal compressibility. Common Femoral Vein: No evidence of thrombus. Normal compressibility, respiratory phasicity and response to augmentation. Saphenofemoral Junction: No evidence of thrombus. Normal  compressibility and flow on color Doppler imaging. Profunda Femoral Vein: No evidence of thrombus. Normal compressibility and flow on color Doppler imaging. Femoral Vein: No evidence of thrombus. Normal compressibility, respiratory phasicity and response to augmentation. Popliteal Vein: No evidence of thrombus. Normal compressibility, respiratory phasicity and response to augmentation. Calf Veins: There is nonocclusive thrombus in the posterior tibial vein. Superficial Great Saphenous Vein: There is thrombus within the great saphenous vein. Other Findings:  Nonspecific lower extremity edema is noted. IMPRESSION: 1. Study is positive for a nonocclusive DVT involving the posterior tibial vein on the right. 2. Superficial thrombophlebitis involving the distal great saphenous vein. 3. Lower extremity edema. Electronically Signed   By: Constance Holster M.D.   On: 11/22/2018 16:42   Dg Chest Portable 1 View  Result Date: 11/28/2018 CLINICAL DATA:  Chronic swelling in the legs. EXAM: PORTABLE CHEST 1 VIEW COMPARISON:  11/23/2018 FINDINGS: The heart size and mediastinal contours are within normal limits. Both lungs are clear. The visualized skeletal structures are unremarkable. IMPRESSION: No active disease. Electronically Signed   By: Van Clines M.D.   On: 11/28/2018 14:04     CBC Recent Labs  Lab 11/28/18 1312 11/29/18 0630 12/01/18 0506 12/03/18 0507  WBC 16.7* 12.9* 10.7* 10.5  HGB 12.9* 11.9* 11.3* 10.9*  HCT 42.1 39.8 37.4* 36.3*  PLT 179 180 168 192  MCV 88.4 89.8 90.6 90.3  MCH 27.1 26.9 27.4 27.1  MCHC 30.6 29.9* 30.2 30.0  RDW 15.8* 16.0* 16.1* 15.8*  LYMPHSABS 0.7  --   --   --   MONOABS 0.9  --   --   --   EOSABS 0.0  --   --   --   BASOSABS 0.0  --   --   --     Chemistries  Recent Labs  Lab 11/28/18 1312 11/29/18 0630 12/01/18 0506 12/02/18 0800 12/03/18 0507  NA 140 144 144 144 137  K 4.6 3.5 3.9 4.0 3.7  CL 102 104 101 102 97*  CO2 25 27 32 32 34*  GLUCOSE  104* 112* 117* 105* 99  BUN 65* 56* 32* 25* 21  CREATININE 3.13* 2.62* 1.76* 1.63* 1.60*  CALCIUM 9.2 8.4* 8.3* 8.4* 8.1*  AST 100*  --   --   --   --   ALT 7  --   --   --   --   ALKPHOS 84  --   --   --   --   BILITOT 1.4*  --   --   --   --    ------------------------------------------------------------------------------------------------------------------ No results for input(s): CHOL, HDL, LDLCALC, TRIG, CHOLHDL, LDLDIRECT in the last 72 hours.  No results found for: HGBA1C ------------------------------------------------------------------------------------------------------------------ No results for input(s): TSH, T4TOTAL, T3FREE, THYROIDAB in the last 72 hours.  Invalid input(s): FREET3 ------------------------------------------------------------------------------------------------------------------ No results for input(s): VITAMINB12, FOLATE, FERRITIN, TIBC, IRON, RETICCTPCT in the last 72 hours.  Coagulation profile Recent Labs  Lab 11/28/18 1312 11/30/18 1426 12/01/18 0506 12/02/18 0742 12/03/18 0507  INR 1.2 1.4* 1.4* 1.4* 1.8*    No results for input(s): DDIMER in the last 72 hours.  Cardiac Enzymes No results for input(s): CKMB, TROPONINI, MYOGLOBIN in the last 168 hours.  Invalid input(s): CK ------------------------------------------------------------------------------------------------------------------    Component Value Date/Time   BNP 14.0 11/22/2018 1427   Eunice Winecoff M.D on 12/03/2018 at 12:25 PM  Go to [email or web address] - for contact info  [facility] - Office  [phone]

## 2018-12-03 NOTE — Discharge Summary (Addendum)
[name], is a 68 y.o. male  DOB [birth year]  MRN [id number].  Admission date:  11/28/2018  Admitting Physician  Neena Rhymes, MD  Discharge Date:  12/04/2018   Primary MD  Rosita Fire, MD  Recommendations for primary care physician for things to follow:    --Bacteremia, generalized weakness and debility, anticoagulation   1)Take Coumadin/warfarin 4 mg every evening--starting Sunday 12/05/2018 until repeat INR on Monday, 12/06/2018 at which time Coumadin dose should be adjusted 2) repeat PT/INR on Monday, 12/06/2018 3) then please repeat another PT/INR along with CBC and BMP on Friday, 12/10/2018 4) follow-up appointment with PCP Dr. Legrand Rams within a week of discharge 5) since you are taking the blood thinner Coumadin please avoid Avoid ibuprofen/Advil/Aleve/Motrin/Goody Powders/Naproxen/BC powders/Meloxicam/Diclofenac/Indomethacin and other Nonsteroidal anti-inflammatory medications as these will make you more likely to bleed and can cause stomach ulcers, can also cause Kidney problems.   Admission Diagnosis  Lower urinary tract infectious disease [N39.0] Generalized weakness [R53.1] AKI (acute kidney injury) ([place]) [N17.9]   Discharge Diagnosis  Lower urinary tract infectious disease [N39.0] Generalized weakness [R53.1] AKI (acute kidney injury) ([place]) [N17.9]    Active Problems:   Schizophrenia ([place])   Dementia ([place])   Sepsis secondary to UTI ([place])   DVT (deep venous thrombosis) ([place])   AKI (acute kidney injury) ([place])      Past Medical History:  Diagnosis Date   Cellulitis    Dementia ([place])    Edema    Hypertension    Hypoglycemia    Mental retardation    Parkinson's disease ([place])    Psychosis ([place])    Renal insufficiency     Past Surgical History:  Procedure Laterality Date   COLONOSCOPY N/A 03/12/2015   Procedure: COLONOSCOPY;  Surgeon: Danie Binder,  MD;  [facility];  Service: Endoscopy;  Laterality: N/A;  1430 - moved to 1/16 @ 1:45 - office to notify     HPI  from the history and physical done on the day of admission:    - Chief Complaint: Increased weakness and non-abmulatory, loss of appetite, failure to thrive  HPI:  The history is provided bya caregiver. The history is limited bya developmental delay.No language interpreter was used.  68 year old male with history of dementia, Parkinson's, schizophrenia, mental retardation, recently diagnosed with right lower extremity DVT on 11/22/2018. 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 Patient is a poor historian.From prior note from 4 days ago when patient was seen in the ED 9/28 and 11/23/2018. Patient has a known right lower extremity DVT and was  having trouble obtaining his Xarelto anticoagulant medication going unprotected for several days. He also has generalized weakness and recurrent falls. He was evaluated and it was felt that his difficulty walking is due to his right lower extremity DVT. He received IV fluid and was able to tolerate p.o. and subsequently discharged home with social work and case management involvement to help with Xarelto. Today I was able  to talk to patient's caregiver at the facility via the phone. Patient did show some mild improvement when he was discharged from the ER however within the past 2 days he has been not himself. He is weak, unable to get up without assistance, unable to ambulate or care for himself. He slumped over and is not eating which is unusual for him. He does not specifically complain of anything but due to his progressive decline in his strength and mental status, staff felt patient will need to evaluate further and arranged for transport to [facility]. No report of infections, no report of increased WOB, but they are not equipped to check pulse oximetry.  ED Course:  Patient hemodynamically stable. Lasb studies revealed leukocytosis to  16.7 with left shift, U/A w/ >50 WBC hpf, few bacteria. CXR NAD.      Hospital Course:    1)) E. coli Bacteremia and  E. coli UTI and Sepsis--- -no fevers,  -WBC down to 10.5 from 16.7  treated with IV meropenem initially with concerns about possible ESBL infection, however urine and blood cultures from 11/28/2018 with mostly pansensitive E. coli, IV Rocephin 2 g x 1 dose on 12/01/2018 then switched to p.o. Keflex 500mg   3 times daily for another 7 days post discharge   2) DVT - patient with DVT by LE venous u/s 11/22/18  right posterior tibial vein with thrombophlebitis right great saphenous vein. Was unable to afford Xarelto and went unprotected for several days.  -V/Q negative,  -Due to cost concerns transition to Coumadin with Lovenox bridge -HH RN  to help with blood draws/INR and CBC monitoring -We will discharge on Coumadin/warfarin 4 mg every evening--first dose of oral Coumadin will be Sunday 12/05/2018 until repeat INR on Monday, 12/06/2018 at which time Coumadin dose should be adjusted -Repeat PT/INR on Monday, 12/06/2018 -then please repeat another PT/INR along with CBC and BMP on Friday, 12/10/2018 -- follow-up appointment with PCP Dr. Legrand Rams within a week of discharge   3)AKI----acute kidney injury due to presumed UTI and dehydration    creatinine on admission=3.1  ,   baseline creatinine = 1.2 (10/14/2018)   , creatinine is now= 1.60    , renally adjust medications, avoid nephrotoxic agents / dehydration  / hypotension -Renal function has improved with hydration -Torsemide has been adjusted, repeat BMP on Friday, 12/10/2018  4.  Dementia/depressive disorder-stable, c/n buspirone, clonazepam, Clozaril, on Aricept  5. Parkinson's -stable, continue Sinemet and Azilect  6. Disposition -   - long term resident at the group home PT eval appreciated recommends home health -[facility] PT with [facility] was  discharged  Disposition--back to group home [facility] with [place] at group home  Code Status : full  Family Communication:   Discussed with [name] [phone] (group home administrator)  Disposition Plan  : hh at group home  Consults  :  na  Discharge Condition: stable  Follow UP--Dr. Legrand Rams within a week  Diet and Activity recommendation:  As advised  Discharge Instructions    Discharge Instructions    Call MD for:  difficulty breathing, headache or visual disturbances   Complete by: As directed    Call MD for:  persistant dizziness or light-headedness   Complete by: As directed    Call MD for:  persistant nausea and vomiting   Complete by: As directed    Call MD for:  severe uncontrolled pain   Complete by: As directed    Call  MD for:  temperature >100.4   Complete by: As directed    Diet - low sodium heart healthy   Complete by: As directed    Discharge instructions   Complete by: As directed    1)Take Coumadin/warfarin 4 mg every evening--starting Sunday 12/05/2018 until repeat INR on Monday, 12/06/2018 at which time Coumadin dose should be adjusted 2) repeat PT/INR on Monday, 12/06/2018 3) then please repeat another PT/INR along with CBC and BMP on Friday, 12/10/2018 4) follow-up appointment with PCP Dr. Legrand Rams within a week of discharge 5) since you are taking the blood thinner Coumadin please avoid Avoid ibuprofen/Advil/Aleve/Motrin/Goody Powders/Naproxen/BC powders/Meloxicam/Diclofenac/Indomethacin and other Nonsteroidal anti-inflammatory medications as these will make you more likely to bleed and can cause stomach ulcers, can also cause Kidney problems.   Face-to-face encounter (required for Medicare/Medicaid patients)   Complete by: As directed    I Aleen Marston certify that this patient is under my care and that I, or a nurse practitioner or physician's assistant working with me, had a face-to-face encounter that meets the physician  face-to-face encounter requirements with this patient on 12/04/2018. The encounter with the patient was in whole, or in part for the following medical condition(s) which is the primary reason for home health care (List medical condition):  PT eval appreciated recommends home health -[facility] RN and [facility] with [facility] was discharged   The encounter with the patient was in whole, or in part, for the following medical condition, which is the primary reason for home health care: DVT and UTI   I certify that, based on my findings, the following services are medically necessary home health services:  Nursing Physical therapy     Reason for Medically Necessary Home Health Services: Skilled Nursing- Change/Decline in Patient Status   My clinical findings support the need for the above services: Unable to leave home safely without assistance and/or assistive device   Further, I certify that my clinical findings support that this patient is homebound due to: Unable to leave home safely without assistance   Home Health   Complete by: As directed    PT eval appreciated recommends home health -HH RN and [facility] PT with [facility] was discharged   To provide the following care/treatments:  PT RN     Walk with assistance   Complete by: As directed         Discharge Medications     Allergies as of 12/04/2018      Reactions   Penicillins    .Did it involve swelling of the face/tongue/throat, SOB, or low BP? Unknown Did it involve sudden or severe rash/hives, skin peeling, or any reaction on the inside of your mouth or nose? Unknown Did you need to seek medical attention at a hospital or doctor's office? Unknown When did it last happen?Unknown If all above answers are NO, may proceed with cephalosporin use.      Medication List    STOP taking these medications   Rivaroxaban 15 & 20 MG Tbpk     TAKE these medications   acetaminophen 500 MG tablet Commonly known as: Q-PAP Take 1 tablet (500 mg total) by  mouth every 6 (six) hours as needed for mild pain or fever. What changed:   reasons to take this  additional instructions   busPIRone 15 MG tablet Commonly known as: BUSPAR Take 1 tablet (15 mg total) by mouth 2 (two) times daily.   carbidopa-levodopa 25-100 MG tablet Commonly known as: SINEMET IR Take 2 tablets  by mouth 4 (four) times daily.   cephALEXin 500 MG capsule Commonly known as: KEFLEX Take 1 capsule (500 mg total) by mouth 3 (three) times daily for 7 days.   clonazePAM 0.5 MG tablet Commonly known as: KLONOPIN Take 0.5 tablets (0.25 mg total) by mouth daily as needed for anxiety.   clozapine 50 MG tablet Commonly known as: CLOZARIL Take 1 tablet (50 mg total) by mouth at bedtime. What changed:   medication strength  how much to take   donepezil 10 MG tablet Commonly known as: ARICEPT Take 1 tablet (10 mg total) by mouth daily.   magnesium oxide 400 MG tablet Commonly known as: MAG-OX Take 1 tablet (400 mg total) by mouth 2 (two) times daily.   polyethylene glycol powder 17 GM/SCOOP powder Commonly known as: GaviLAX Take 17 g by mouth daily.   potassium chloride SA 20 MEQ tablet Commonly known as: KLOR-CON Take 1 tablet (20 mEq total) by mouth daily. What changed:   how much to take  when to take this   rasagiline 1 MG Tabs tablet Commonly known as: AZILECT Take 1 tablet (1 mg total) by mouth daily. What changed: medication strength   senna-docusate 8.6-50 MG tablet Commonly known as: Senokot-S Take 2 tablets by mouth at bedtime.   silver sulfADIAZINE 1 % cream Commonly known as: SILVADENE Apply 1 application topically daily.   tamsulosin 0.4 MG Caps capsule Commonly known as: FLOMAX Take 1 capsule (0.4 mg total) by mouth daily.   torsemide 20 MG tablet Commonly known as: DEMADEX Take 1 tablet (20 mg total) by mouth daily. What changed:   how much to take  when to take this   warfarin 4 MG tablet Commonly known as:  COUMADIN Take 1 tablet (4 mg total) by mouth See admin instructions. Take 4 mg every evening--on to repeat INR on Monday, 12/06/2018 at which time Coumadin dose should be adjusted       Major procedures and Radiology Reports - PLEASE review detailed and final reports for all details, in brief -   Dg Chest 2 View  Result Date: 11/23/2018 CLINICAL DATA:  Weakness. EXAM: CHEST - 2 VIEW COMPARISON:  Portable chest 10/13/2018. FINDINGS: Mediastinum and hilar structures normal. Stable elevation left hemidiaphragm with mild left base atelectasis. No acute infiltrate. No pleural effusion or pneumothorax. IMPRESSION: Stable elevation left hemidiaphragm with mild atelectasis left lung base. No acute abnormality identified. Electronically Signed   By: Marcello Moores  Register   On: 11/23/2018 14:01   Ct Head Wo Contrast  Result Date: 11/23/2018 CLINICAL DATA:  Altered mental status EXAM: CT HEAD WITHOUT CONTRAST TECHNIQUE: Contiguous axial images were obtained from the base of the skull through the vertex without intravenous contrast. COMPARISON:  None. FINDINGS: Brain: No evidence of acute infarction, hemorrhage, hydrocephalus, extra-axial collection or mass lesion/mass effect. Scattered low-density changes within the periventricular and subcortical white matter compatible with chronic microvascular ischemic change. Mild diffuse cerebral volume loss. Vascular: Mild atherosclerotic calcifications involving the large vessels of the skull base. No unexpected hyperdense vessel. Skull: Normal. Negative for fracture or focal lesion. Sinuses/Orbits: No acute finding. Other: None. IMPRESSION: No acute intracranial findings. Electronically Signed   By: Davina Poke M.D.   On: 11/23/2018 16:13   Nm Pulmonary Perfusion  Result Date: 11/28/2018 CLINICAL DATA:  PE suspected EXAM: NUCLEAR MEDICINE PERFUSION LUNG SCAN TECHNIQUE: Perfusion images were obtained in multiple projections after intravenous injection of  radiopharmaceutical. Ventilation scans intentionally deferred if perfusion scan and chest x-ray adequate  for interpretation during COVID 19 epidemic. RADIOPHARMACEUTICALS:  1.6 mCi Tc-24m MAA IV COMPARISON:  Same day chest radiograph FINDINGS: Normal, homogeneous pulmonary perfusion on diffusion only imaging. No suspicious perfusion defect. IMPRESSION: Normal pulmonary perfusion scan.  No evidence of pulmonary embolism. Electronically Signed   By: Eddie Candle M.D.   On: 11/28/2018 19:53   US Venous Img Lower Unilateral Right  Result Date: 11/22/2018 CLINICAL DATA:  Lower extremity edema and pain. EXAM: RIGHT LOWER EXTREMITY VENOUS DOPPLER ULTRASOUND TECHNIQUE: Gray-scale sonography with graded compression, as well as color Doppler and duplex ultrasound were performed to evaluate the lower extremity deep venous systems from the level of the common femoral vein and including the common femoral, femoral, profunda femoral, popliteal and calf veins including the posterior tibial, peroneal and gastrocnemius veins when visible. The superficial great saphenous vein was also interrogated. Spectral Doppler was utilized to evaluate flow at rest and with distal augmentation maneuvers in the common femoral, femoral and popliteal veins. COMPARISON:  September 13, 2011.  October 13, 2018. FINDINGS: Contralateral Common Femoral Vein: Respiratory phasicity is normal and symmetric with the symptomatic side. No evidence of thrombus. Normal compressibility. Common Femoral Vein: No evidence of thrombus. Normal compressibility, respiratory phasicity and response to augmentation. Saphenofemoral Junction: No evidence of thrombus. Normal compressibility and flow on color Doppler imaging. Profunda Femoral Vein: No evidence of thrombus. Normal compressibility and flow on color Doppler imaging. Femoral Vein: No evidence of thrombus. Normal compressibility, respiratory phasicity and response to augmentation. Popliteal Vein: No evidence of  thrombus. Normal compressibility, respiratory phasicity and response to augmentation. Calf Veins: There is nonocclusive thrombus in the posterior tibial vein. Superficial Great Saphenous Vein: There is thrombus within the great saphenous vein. Other Findings:  Nonspecific lower extremity edema is noted. IMPRESSION: 1. Study is positive for a nonocclusive DVT involving the posterior tibial vein on the right. 2. Superficial thrombophlebitis involving the distal great saphenous vein. 3. Lower extremity edema. Electronically Signed   By: Constance Holster M.D.   On: 11/22/2018 16:42   Dg Chest Portable 1 View  Result Date: 11/28/2018 CLINICAL DATA:  Chronic swelling in the legs. EXAM: PORTABLE CHEST 1 VIEW COMPARISON:  11/23/2018 FINDINGS: The heart size and mediastinal contours are within normal limits. Both lungs are clear. The visualized skeletal structures are unremarkable. IMPRESSION: No active disease. Electronically Signed   By: Van Clines M.D.   On: 11/28/2018 14:04    Micro Results    Recent Results (from the past 240 hour(s))  Urine Culture     Status: Abnormal   Collection Time: 11/28/18  1:12 PM   Specimen: Urine, Clean Catch  Result Value Ref Range Status   Specimen Description   Final    URINE, CLEAN CATCH Performed at [facility], [address], [place], [place] [address]    Special Requests   Final    NONE Performed at [facility], [address], [place], [place] [address]    Culture >=100,000 COLONIES/mL ESCHERICHIA COLI (A)  Final   Report Status 11/30/2018 FINAL  Final   Organism ID, Bacteria ESCHERICHIA COLI (A)  Final      Susceptibility   Escherichia coli - MIC*    AMPICILLIN >=32 RESISTANT Resistant     CEFAZOLIN <=4 SENSITIVE Sensitive     CEFTRIAXONE <=1 SENSITIVE Sensitive     CIPROFLOXACIN <=0.25 SENSITIVE Sensitive     GENTAMICIN <=1 SENSITIVE Sensitive     IMIPENEM <=0.25 SENSITIVE Sensitive     NITROFURANTOIN <=16 SENSITIVE Sensitive  TRIMETH/SULFA <=20 SENSITIVE Sensitive     AMPICILLIN/SULBACTAM 4 SENSITIVE Sensitive     PIP/TAZO <=4 SENSITIVE Sensitive     Extended ESBL NEGATIVE Sensitive     * >=100,000 COLONIES/mL ESCHERICHIA COLI  SARS CORONAVIRUS 2 (TAT 6-24 HRS) Nasopharyngeal Nasopharyngeal Swab     Status: None   Collection Time: 11/28/18  1:40 PM   Specimen: Nasopharyngeal Swab  Result Value Ref Range Status   SARS Coronavirus 2 NEGATIVE NEGATIVE Final    Comment: (NOTE) SARS-CoV-2 target nucleic acids are NOT DETECTED. The SARS-CoV-2 RNA is generally detectable in upper and lower respiratory specimens during the acute phase of infection. Negative results do not preclude SARS-CoV-2 infection, do not rule out co-infections with other pathogens, and should not be used as the sole basis for treatment or other patient management decisions. Negative results must be combined with clinical observations, patient history, and epidemiological information. The expected result is Negative. Fact Sheet for Patients: SugarRoll.be Fact Sheet for Healthcare Providers: https://www.woods-mathews.com/ This test is not yet approved or cleared by the [place] FDA and  has been authorized for detection and/or diagnosis of SARS-CoV-2 by FDA under an Emergency Use Authorization (EUA). This EUA will remain  in effect (meaning this test can be used) for the duration of the COVID-19 declaration under Section 56 4(b)(1) of the Act, 21 U.S.C. section 360bbb-3(b)(1), unless the authorization is terminated or revoked sooner. Performed at [facility], [place] [address] [place], [place] [address]   Blood Culture (routine x 2)     Status: Abnormal   Collection Time: 11/28/18  3:50 PM   Specimen: BLOOD LEFT HAND  Result Value Ref Range Status   Specimen Description   Final    BLOOD LEFT HAND Performed at [facility], [address], [place], [place] [address]    Special Requests    Final    BOTTLES DRAWN AEROBIC AND ANAEROBIC Blood Culture adequate volume Performed at [facility], [address] [place], [place] [address]    Culture  Setup Time   Final    GRAM NEGATIVE RODS STYPA,N @ 0428 ON 11/29/18 BY JUW GS DONE @ [facility] IN BOTH AEROBIC AND ANAEROBIC BOTTLES    Culture ESCHERICHIA COLI (A)  Final   Report Status 12/01/2018 FINAL  Final   Organism ID, Bacteria ESCHERICHIA COLI  Final      Susceptibility   Escherichia coli - MIC*    AMPICILLIN >=32 RESISTANT Resistant     CEFAZOLIN <=4 SENSITIVE Sensitive     CEFEPIME <=1 SENSITIVE Sensitive     CEFTAZIDIME <=1 SENSITIVE Sensitive     CEFTRIAXONE <=1 SENSITIVE Sensitive     CIPROFLOXACIN <=0.25 SENSITIVE Sensitive     GENTAMICIN <=1 SENSITIVE Sensitive     IMIPENEM <=0.25 SENSITIVE Sensitive     TRIMETH/SULFA <=20 SENSITIVE Sensitive     AMPICILLIN/SULBACTAM 4 SENSITIVE Sensitive     PIP/TAZO <=4 SENSITIVE Sensitive     Extended ESBL NEGATIVE Sensitive     * ESCHERICHIA COLI  Blood Culture ID Panel (Reflexed)     Status: Abnormal   Collection Time: 11/28/18  3:50 PM  Result Value Ref Range Status   Enterococcus species NOT DETECTED NOT DETECTED Final   Listeria monocytogenes NOT DETECTED NOT DETECTED Final   Staphylococcus species NOT DETECTED NOT DETECTED Final   Staphylococcus aureus (BCID) NOT DETECTED NOT DETECTED Final   Streptococcus species NOT DETECTED NOT DETECTED Final   Streptococcus agalactiae NOT DETECTED NOT DETECTED Final   Streptococcus  pneumoniae NOT DETECTED NOT DETECTED Final   Streptococcus pyogenes NOT DETECTED NOT DETECTED Final   Acinetobacter baumannii NOT DETECTED NOT DETECTED Final   Enterobacteriaceae species DETECTED (A) NOT DETECTED Final    Comment: Enterobacteriaceae represent a large family of gram-negative bacteria, not a single organism. CRITICAL RESULT CALLED TO, READ BACK BY AND VERIFIED WITH: S. Haines, AT [phone] 11/29/18 BY D. VANHOOK    Enterobacter cloacae  complex NOT DETECTED NOT DETECTED Final   Escherichia coli DETECTED (A) NOT DETECTED Final    Comment: CRITICAL RESULT CALLED TO, READ BACK BY AND VERIFIED WITH: Gloris Manchester PHARMD, AT 0347 11/29/18 BY D. VANHOOK    Klebsiella oxytoca NOT DETECTED NOT DETECTED Final   Klebsiella pneumoniae NOT DETECTED NOT DETECTED Final   Proteus species NOT DETECTED NOT DETECTED Final   Serratia marcescens NOT DETECTED NOT DETECTED Final   Carbapenem resistance NOT DETECTED NOT DETECTED Final   Haemophilus influenzae NOT DETECTED NOT DETECTED Final   Neisseria meningitidis NOT DETECTED NOT DETECTED Final   Pseudomonas aeruginosa NOT DETECTED NOT DETECTED Final   Candida albicans NOT DETECTED NOT DETECTED Final   Candida glabrata NOT DETECTED NOT DETECTED Final   Candida krusei NOT DETECTED NOT DETECTED Final   Candida parapsilosis NOT DETECTED NOT DETECTED Final   Candida tropicalis NOT DETECTED NOT DETECTED Final    Comment: Performed at [facility], [place] [address] [place], [place] [address]  Blood Culture (routine x 2)     Status: None   Collection Time: 11/28/18  3:59 PM   Specimen: BLOOD RIGHT ARM  Result Value Ref Range Status   Specimen Description BLOOD RIGHT ARM  Final   Special Requests   Final    BOTTLES DRAWN AEROBIC AND ANAEROBIC Blood Culture adequate volume   Culture   Final    NO GROWTH 5 DAYS Performed at [facility], [address], [place], [place] [address]    Report Status 12/03/2018 FINAL  Final       Today   Subjective    [name] today has no new complaints          Patient has been seen and examined prior to discharge   Objective   Blood pressure (!) 98/58, pulse 62, temperature 98 F (36.7 C), temperature source Oral, resp. rate 18, height 6' (1.829 m), weight 85.7 kg, SpO2 98 %.   Intake/Output Summary (Last 24 hours) at 12/04/2018 1202 Last data filed at 12/03/2018 1830 Gross per 24 hour  Intake 240 ml  Output 400 ml  Net -160 ml     Exam Gen:- Awake Alert, no acute distress , resting comfortably HEENT:- [place].AT, No sclera icterus Neck-Supple Neck,No JVD,.  Lungs-  CTAB , good air movement bilaterally  CV- S1, S2 normal, regular Abd-  +ve B.Sounds, Abd Soft, No tenderness,    Extremity/Skin:- No  edema,   good pulses Psych-affect is appropriate, intermittent episodes of confusion and disorientation Neuro-generalized weakness, no new focal deficits, occasional tremors    Data Review   CBC w Diff:  Lab Results  Component Value Date   WBC 10.5 12/03/2018   HGB 10.9 (L) 12/03/2018   HCT 36.3 (L) 12/03/2018   PLT 192 12/03/2018   LYMPHOPCT 4 11/28/2018   MONOPCT 5 11/28/2018   EOSPCT 0 11/28/2018   BASOPCT 0 11/28/2018    CMP:  Lab Results  Component Value Date   NA 137 12/03/2018   K 3.7 12/03/2018   CL 97 (L)  12/03/2018   CO2 34 (H) 12/03/2018   BUN 21 12/03/2018   CREATININE 1.60 (H) 12/03/2018   PROT 7.7 11/28/2018   ALBUMIN 2.6 (L) 12/02/2018   BILITOT 1.4 (H) 11/28/2018   ALKPHOS 84 11/28/2018   AST 100 (H) 11/28/2018   ALT 7 11/28/2018  .   Total Discharge time is about 33 minutes  Roxan Hockey M.D on 12/04/2018 at 12:02 PM  Go to [email or web address] -  for contact info  [facility] - Office  [phone]

## 2018-12-03 NOTE — Plan of Care (Signed)
Patient up in chair, able to follow commands and ambulate to bathroom and to bed with minimal assistance.

## 2018-12-03 NOTE — Progress Notes (Signed)
Physical Therapy Treatment Patient Details Name: [name] MRN: [id number] DOB: 10-12-1950 Today's Date: 12/03/2018    History of Present Illness 68 year old male with history of dementia, Parkinson's, schizophrenia, mental retardation, recently diagnosed with right lower extremity DVT on 11/22/2018. He is a resident at [facility] brought here via EMS for evaluation of weakness.  Patient is a poor historian.  From prior note from 4 days ago when patient was seen in the ED 9/28 and 11/23/2018. Patient has a known right lower extremity DVT and was  having trouble obtaining his Xarelto anticoagulant medication going unprotected for several days.  He also has generalized weakness and recurrent falls.  He was evaluated and it was felt that his difficulty walking is due to his right lower extremity DVT.  He received IV fluid and was able to tolerate p.o. and subsequently discharged home with social work and case management involvement to help with Xarelto.  Today I was able to talk to patient's caregiver at the facility via the phone.  Patient did show some mild improvement when he was discharged from the ER however within the past 2 days he has been not himself.  He is weak, unable to get up without assistance, unable to ambulate or care for himself.  He slumped over and is not eating which is unusual for him.  He does not specifically complain of anything but due to his progressive decline in his strength and mental status, staff felt patient will need to evaluate further and arranged for transport to [facility]    PT Comments    Patient able to follow directions with occasional repeated verbal/tactile cueing, continues hold onto his shaving kit bag during functional activity, demonstrates increased endurance/distance for ambulation using 1 arm leaning on RW, other hand holding shaving kit bag and tolerated sitting up in chair after therapy - nursing staff aware.  Patient will benefit from  continued physical therapy in hospital and recommended venue below to increase strength, balance, endurance for safe ADLs and gait.   Follow Up Recommendations  Home health PT;Supervision for mobility/OOB;Supervision/Assistance - 24 hour     Equipment Recommendations  None recommended by PT    Recommendations for Other Services       Precautions / Restrictions Precautions Precautions: Fall Restrictions Weight Bearing Restrictions: No    Mobility  Bed Mobility Overal bed mobility: Modified Independent             General bed mobility comments: increased time  Transfers Overall transfer level: Needs assistance Equipment used: Rolling walker (2 wheeled);None Transfers: Risk manager;Sit to/from Stand Sit to Stand: Min guard;Min assist Stand pivot transfers: Min guard;Min assist       General transfer comment: unsteady with occasional leaning backwards when standing  Ambulation/Gait Ambulation/Gait assistance: Min assist Gait Distance (Feet): 55 Feet Assistive device: Rolling walker (2 wheeled) Gait Pattern/deviations: Decreased step length - right;Decreased step length - left;Decreased stride length Gait velocity: decreased   General Gait Details: labored cadence with decreased step/stride length, uses 1 arm due to not letting go of shaving kit bag with other hand, no loss of balance, limited secondary to fatigue   Stairs             Wheelchair Mobility    Modified Rankin (Stroke Patients Only)       Balance Overall balance assessment: Needs assistance Sitting-balance support: Feet supported;No upper extremity supported Sitting balance-Leahy Scale: Good Sitting balance - Comments: good sitting up at bedside  Standing balance support: During functional activity;No upper extremity supported Standing balance-Leahy Scale: Poor Standing balance comment: fair/poor without AD, fair using 1 arm on RW                             Cognition Arousal/Alertness: Awake/alert Behavior During Therapy: WFL for tasks assessed/performed Overall Cognitive Status: History of cognitive impairments - at baseline                                        Exercises General Exercises - Lower Extremity Ankle Circles/Pumps: Seated;AROM;Strengthening;Both;10 reps Long Arc Quad: Seated;AROM;Strengthening;Both;10 reps;AAROM Hip Flexion/Marching: Seated;AROM;AAROM;Strengthening;Both;10 reps    General Comments        Pertinent Vitals/Pain Pain Assessment: Faces Faces Pain Scale: No hurt    Home Living                      Prior Function            PT Goals (current goals can now be found in the care plan section) Acute Rehab PT Goals Patient Stated Goal: return home PT Goal Formulation: With patient Time For Goal Achievement: 12/06/18 Potential to Achieve Goals: Good Progress towards PT goals: Progressing toward goals    Frequency    Min 3X/week      PT Plan Current plan remains appropriate    Co-evaluation              AM-PAC PT "6 Clicks" Mobility   Outcome Measure  Help needed turning from your back to your side while in a flat bed without using bedrails?: None Help needed moving from lying on your back to sitting on the side of a flat bed without using bedrails?: None Help needed moving to and from a bed to a chair (including a wheelchair)?: A Little Help needed standing up from a chair using your arms (e.g., wheelchair or bedside chair)?: A Little Help needed to walk in hospital room?: A Little Help needed climbing 3-5 steps with a railing? : A Lot 6 Click Score: 19    End of Session Equipment Utilized During Treatment: Gait belt Activity Tolerance: Patient tolerated treatment well;Patient limited by fatigue Patient left: in chair;with call bell/phone within reach;with chair alarm set Nurse Communication: Mobility status PT Visit Diagnosis: Unsteadiness on feet  (R26.81);Other abnormalities of gait and mobility (R26.89);Muscle weakness (generalized) (M62.81)     Time: 4818-5631 PT Time Calculation (min) (ACUTE ONLY): 29 min  Charges:  $Gait Training: 8-22 mins $Therapeutic Exercise: 8-22 mins                     12:33 PM, 12/03/18 Lonell Grandchild, MPT Physical Therapist with [facility] 336 [phone] office [phone] mobile phone

## 2018-12-03 NOTE — Care Management Important Message (Signed)
Important Message  Patient Details  Name: [name] MRN: [id number] Date of Birth: [birth year]   Medicare Important Message Given:  Yes     Tommy Medal 12/03/2018, 12:37 PM

## 2018-12-03 NOTE — NC FL2 (Signed)
MEDICAID FL2 LEVEL OF CARE SCREENING TOOL     IDENTIFICATION  Patient Name: [name] Birthdate: Nov 15, 1950 Sex: male Admission Date (Current Location): 11/28/2018  [facility] and [place] Number:  [employer] and Address:  [place] [address], [place]      Provider Number: [id number]  Attending Physician Name and Address:  Roxan Hockey, MD  Relative Name and Phone Number:  [name]    Current Level of Care: Hospital Recommended Level of Care: [facility] Prior Approval Number:    Date Approved/Denied:   PASRR Number:    Discharge Plan: Other (Comment)(back to [place])    Current Diagnoses: Patient Active Problem List   Diagnosis Date Noted  . Sepsis secondary to UTI ([place]) 11/28/2018  . DVT (deep venous thrombosis) ([place]) 11/28/2018  . AKI (acute kidney injury) ([place]) 11/28/2018  . Cellulitis 10/13/2018  . Schizophrenia ([place]) 10/13/2018  . Parkinson disease ([place]) 10/13/2018  . Dementia ([place]) 10/13/2018  . Encounter for screening colonoscopy 01/30/2015    Orientation RESPIRATION BLADDER Height & Weight     Self  Normal Continent Weight: 85.7 kg Height:  6' (182.9 cm)  BEHAVIORAL SYMPTOMS/MOOD NEUROLOGICAL BOWEL NUTRITION STATUS      Continent Diet  AMBULATORY STATUS COMMUNICATION OF NEEDS Skin   Supervision Verbally Normal                       Personal Care Assistance Level of Assistance  Bathing, Feeding, Dressing Bathing Assistance: Limited assistance Feeding assistance: Limited assistance Dressing Assistance: Limited assistance     Functional Limitations Info  Sight, Hearing, Speech Sight Info: Adequate Hearing Info: Adequate Speech Info: Adequate    SPECIAL CARE FACTORS FREQUENCY  PT (By licensed PT)     PT Frequency: PT x 2-3 week              Contractures Contractures Info: Not present    Additional Factors Info  Code Status, Allergies Code  Status Info: FULL Allergies Info: Penicillins           Current Medications (12/03/2018):  This is the current hospital active medication list Current Facility-Administered Medications  Medication Dose Route Frequency Provider Last Rate Last Dose  . acetaminophen (TYLENOL) tablet 500 mg  500 mg Oral Q6H PRN Norins, Heinz Knuckles, MD      . busPIRone (BUSPAR) tablet 15 mg  15 mg Oral BID Neena Rhymes, MD   15 mg at 12/03/18 0806  . carbidopa-levodopa (SINEMET IR) 25-100 MG per tablet immediate release 2 tablet  2 tablet Oral QID Norins, Heinz Knuckles, MD   2 tablet at 12/03/18 1324  . cephALEXin (KEFLEX) capsule 500 mg  500 mg Oral TID Roxan Hockey, MD   500 mg at 12/03/18 1516  . chlorhexidine (PERIDEX) 0.12 % solution 15 mL  15 mL Mouth Rinse BID Norins, Heinz Knuckles, MD   15 mL at 12/03/18 0808  . clonazePAM (KLONOPIN) disintegrating tablet 0.25 mg  0.25 mg Oral Daily Kathie Dike, MD   0.25 mg at 12/03/18 0807  . cloZAPine (CLOZARIL) tablet 75 mg  75 mg Oral QHS Neena Rhymes, MD   75 mg at 12/02/18 2054  . dextrose 5 %-0.45 % sodium chloride infusion   Intravenous Continuous Roxan Hockey, MD 20 mL/hr at 12/03/18 0811    . donepezil (ARICEPT) tablet 10 mg  10 mg Oral Daily Norins, Heinz Knuckles, MD   10 mg  at 12/03/18 0807  . enoxaparin (LOVENOX) injection 90 mg  90 mg Subcutaneous Q12H Emokpae, Courage, MD   90 mg at 12/03/18 0815  . MEDLINE mouth rinse  15 mL Mouth Rinse q12n4p Norins, Heinz Knuckles, MD   15 mL at 12/03/18 1327  . polyethylene glycol (MIRALAX / GLYCOLAX) packet 17 g  17 g Oral Daily Dana Allan I, MD   17 g at 12/01/18 7062  . potassium chloride SA (KLOR-CON) CR tablet 40 mEq  40 mEq Oral BID Neena Rhymes, MD   40 mEq at 12/03/18 3762  . rasagiline (AZILECT) tablet 1 mg  1 mg Oral Daily Norins, Heinz Knuckles, MD   1 mg at 12/03/18 0810  . senna-docusate (Senokot-S) tablet 2 tablet  2 tablet Oral Once Emokpae, Courage, MD      . tamsulosin (FLOMAX) capsule 0.4 mg   0.4 mg Oral Daily Norins, Heinz Knuckles, MD   0.4 mg at 12/03/18 0807  . torsemide (DEMADEX) tablet 40 mg  40 mg Oral BID Neena Rhymes, MD   40 mg at 12/03/18 0807  . Warfarin - Pharmacist Dosing Inpatient   Does not apply q1800 Emokpae, Courage, MD         Discharge Medications: acetaminophen 500 MG tablet Commonly known as: Q-PAP Take 1 tablet (500 mg total) by mouth every 6 (six) hours as needed for mild pain or fever. What changed:  0 reasons to take this 0 additional instructions           busPIRone 15 MG tablet Commonly known as: BUSPAR Take 1 tablet (15 mg total) by mouth 2 (two) times daily.          carbidopa-levodopa 25-100 MG tablet Commonly known as: SINEMET IR Take 2 tablets by mouth 4 (four) times daily.          cephALEXin 500 MG capsule Commonly known as: KEFLEX Take 1 capsule (500 mg total) by mouth 3 (three) times daily for 7 days.          clonazePAM 0.5 MG tablet Commonly known as: KLONOPIN Take 0.5 tablets (0.25 mg total) by mouth daily as needed for anxiety.          clozapine 50 MG tablet Commonly known as: CLOZARIL Take 1 tablet (50 mg total) by mouth at bedtime. What changed:  0 medication strength 0 how much to take          donepezil 10 MG tablet Commonly known as: ARICEPT Take 1 tablet (10 mg total) by mouth daily.          magnesium oxide 400 MG tablet Commonly known as: MAG-OX Take 1 tablet (400 mg total) by mouth 2 (two) times daily.          polyethylene glycol powder 17 GM/SCOOP powder Commonly known as: GaviLAX Take 17 g by mouth daily.          potassium chloride SA 20 MEQ tablet Commonly known as: KLOR-CON Take 1 tablet (20 mEq total) by mouth daily. What changed:  0 how much to take 0 when to take this          rasagiline 1 MG Tabs tablet Commonly known as: AZILECT Take 1 tablet (1 mg total) by mouth daily. What changed: medication strength          senna-docusate 8.6-50 MG tablet Commonly known as: Senokot-S Take 2 tablets by  mouth at bedtime.          silver sulfADIAZINE 1 %  cream Commonly known as: SILVADENE Apply 1 application topically daily.          tamsulosin 0.4 MG Caps capsule Commonly known as: FLOMAX Take 1 capsule (0.4 mg total) by mouth daily.          torsemide 20 MG tablet Commonly known as: DEMADEX Take 1 tablet (20 mg total) by mouth daily. What changed:  0 how much to take 0 when to take this          warfarin 4 MG tablet Commonly known as: COUMADIN Take 1 tablet (4 mg total) by mouth See admin instructions. Take 4 mg every evening--on to repeat INR on Monday, 12/06/2018 at which time Coumadin dose should be adjusted            Relevant Imaging Results:  Relevant Lab Results:   Additional Information SS # [id number]  Trish Mage, LCSW

## 2018-12-03 NOTE — TOC Progression Note (Signed)
Transition of Care [facility]) - Progression Note    Patient Details  Name: [name] MRN: [id number] Date of Birth: 06-04-1950  [facility]) CM/SW [place], [place] Phone Number: 12/03/2018, 3:29 PM  Clinical Narrative:   Today was able to confirm [facility] services of nursing and PT by [facility], and orders were put in for same by Dr.  Zetta Bills patient hospital follow up appointment with PCP [place].  Generated FL2, which will need to be sent home with patient, or FAXed to facility.  Plan is for d/c Saturday, according to Dr.  Perley Jain Ms [place] with facility.  Her number is [phone].    Expected Discharge Plan: [place] Barriers to Discharge: Continued Medical Work up  Expected Discharge Plan and Services Expected Discharge Plan: [place]   Discharge Planning Services: CM Consult Post Acute Care Choice: [place] arrangements for the past 2 months: Group Home Expected Discharge Date: 12/04/18                                     Social Determinants of Health (SDOH) Interventions    Readmission Risk Interventions No flowsheet data found.

## 2018-12-04 LAB — PROTIME-INR
INR: 2.1 — ABNORMAL HIGH (ref 0.8–1.2)
Prothrombin Time: 23.1 seconds — ABNORMAL HIGH (ref 11.4–15.2)

## 2018-12-04 MED ORDER — WARFARIN SODIUM 7.5 MG PO TABS
7.5000 mg | ORAL_TABLET | Freq: Once | ORAL | Status: AC
  Administered 2018-12-04: 7.5 mg via ORAL
  Filled 2018-12-04: qty 1

## 2018-12-04 MED ORDER — WARFARIN SODIUM 7.5 MG PO TABS: 7.5000 mg | ORAL_TABLET | Freq: Once | ORAL | Status: DC

## 2018-12-04 MED ORDER — WARFARIN - PHYSICIAN DOSING INPATIENT: Freq: Every day | Status: DC

## 2018-12-04 NOTE — Progress Notes (Signed)
IV removed, 2x2 gauze and paper tape applied to site, patient tolerated well.  Reviewed AVS with [name] who verbalized understanding.  Staff member from [facility] to transport patient to facility.  Patient taken to lobby via wheelchair.

## 2018-12-04 NOTE — Progress Notes (Signed)
ANTICOAGULATION CONSULT NOTE -   Pharmacy Consult for  Warfarin dosing Indication: VTE treatment  Allergies  Allergen Reactions  . Penicillins     .Did it involve swelling of the face/tongue/throat, SOB, or low BP? Unknown Did it involve sudden or severe rash/hives, skin peeling, or any reaction on the inside of your mouth or nose? Unknown Did you need to seek medical attention at a hospital or doctor's office? Unknown When did it last happen?Unknown If all above answers are "NO", may proceed with cephalosporin use.     Patient Measurements: Height: 6' (182.9 cm) Weight: 188 lb 15 oz (85.7 kg) IBW/kg (Calculated) : 77.6 Heparin Dosing Weight: HEPARIN DW (KG): 85.7  Vital Signs: Temp: 98 F (36.7 C) (10/10 0537) Temp Source: Oral (10/10 0537) BP: 98/58 (10/10 0537) Pulse Rate: 62 (10/10 0537)  Labs: Recent Labs    12/02/18 0742 12/02/18 0800 12/03/18 0507 12/04/18 0655  HGB  --   --  10.9*  --   HCT  --   --  36.3*  --   PLT  --   --  192  --   LABPROT 16.6*  --  20.7* 23.1*  INR 1.4*  --  1.8* 2.1*  CREATININE  --  1.63* 1.60*  --     Estimated Creatinine Clearance: 48.5 mL/min (A) (by C-G formula based on SCr of 1.6 mg/dL (H)).   Medical History: Past Medical History:  Diagnosis Date  . Cellulitis   . Dementia ([place])   . Edema   . Hypertension   . Hypoglycemia   . Mental retardation   . Parkinson's disease ([place])   . Psychosis ([place])   . Renal insufficiency       Assessment: Pharmacy consulted to dose warfarin for this 68 yo male patient currently on apixaban 5mg  bid for VTE treatment.  Patient is  unable to afford apixaban therapy as an outpatient, so warfarin will be started with Lovenox bridge until INR is  Therapeutic.  INR 2.1   Goal of Therapy:  INR 2-3 Monitor platelets by anticoagulation protocol: Yes   Plan Give warfarin 7.5 mg po x1 dose tonight Lovenox to 90mg  sub-q q12h discontinued  CBC ordered qM-W-F Daily PT/INR Pharmacy  to monitor renal fx and signs/symptoms of bleeding.  Thomasenia Sales, PharmD, MBA, BCGP Clinical Pharmacist  12/04/2018 11:47 AM

## 2018-12-04 NOTE — Discharge Instructions (Signed)
-  Bacteremia, generalized weakness and debility, anticoagulation  1)Take Coumadin/warfarin 4 mg every evening--starting Sunday 12/05/2018 until repeat INR on Monday, 12/06/2018 at which time Coumadin dose should be adjusted 2) repeat PT/INR on Monday, 12/06/2018 3) then please repeat another PT/INR along with CBC and BMP on Friday, 12/10/2018 4) follow-up appointment with PCP Dr. Legrand Rams within a week of discharge 5) since you are taking the blood thinner Coumadin please avoid Avoid ibuprofen/Advil/Aleve/Motrin/Goody Powders/Naproxen/BC powders/Meloxicam/Diclofenac/Indomethacin and other Nonsteroidal anti-inflammatory medications as these will make you more likely to bleed and can cause stomach ulcers, can also cause Kidney problems.

## 2018-12-04 NOTE — TOC Transition Note (Addendum)
Transition of Care [facility]) - CM/SW Discharge Note   Patient Details  Name: [name] MRN: [id number] Date of Birth: 04/09/1950  [facility] CM/SW Contact:  Marshell Garfinkel, RN Phone Number: 12/04/2018, 8:35 AM   Clinical Narrative:    Lenna Sciara with [facility] updated of patient discharge. Ms. Huel Cote updated [phone] fax [phone] for FL2. Ms. Huel Cote is working on transportation.    Final next level of care: [place] Barriers to Discharge: Continued Medical Work up   Patient Goals and CMS Choice        Discharge Placement                       Discharge Plan and Services   Discharge Planning Services: CM Consult Post Acute Care Choice: Home Health                    HH Arranged: PT [place]: [place] ([facility]) Date HH Agency Contacted: 12/04/18 Time [place]: [phone] Representative spoke with at [place]: [place] ([place]) Interventions     Readmission Risk Interventions No flowsheet data found.

## 2018-12-06 ENCOUNTER — Emergency Department (HOSPITAL_COMMUNITY)
Admission: EM | Admit: 2018-12-06 | Discharge: 2018-12-06 | Disposition: A | Discharge status: Home / Self Care | Payer: Medicare Other | Attending: Emergency Medicine | Admitting: Emergency Medicine

## 2018-12-06 ENCOUNTER — Other Ambulatory Visit: Payer: Self-pay

## 2018-12-06 ENCOUNTER — Emergency Department (HOSPITAL_COMMUNITY): Payer: Medicare Other

## 2018-12-06 ENCOUNTER — Encounter (HOSPITAL_COMMUNITY): Payer: Self-pay | Admitting: *Deleted

## 2018-12-06 DIAGNOSIS — Z7901 Long term (current) use of anticoagulants: Secondary | ICD-10-CM | POA: Insufficient documentation

## 2018-12-06 DIAGNOSIS — G20 Parkinson's disease: Secondary | ICD-10-CM | POA: Insufficient documentation

## 2018-12-06 DIAGNOSIS — F17200 Nicotine dependence, unspecified, uncomplicated: Secondary | ICD-10-CM | POA: Insufficient documentation

## 2018-12-06 DIAGNOSIS — R0602 Shortness of breath: Secondary | ICD-10-CM | POA: Diagnosis not present

## 2018-12-06 DIAGNOSIS — F0280 Dementia in other diseases classified elsewhere without behavioral disturbance: Secondary | ICD-10-CM | POA: Diagnosis not present

## 2018-12-06 DIAGNOSIS — Z79899 Other long term (current) drug therapy: Secondary | ICD-10-CM | POA: Diagnosis not present

## 2018-12-06 DIAGNOSIS — R069 Unspecified abnormalities of breathing: Secondary | ICD-10-CM | POA: Diagnosis not present

## 2018-12-06 DIAGNOSIS — N189 Chronic kidney disease, unspecified: Secondary | ICD-10-CM | POA: Insufficient documentation

## 2018-12-06 DIAGNOSIS — R531 Weakness: Secondary | ICD-10-CM | POA: Diagnosis not present

## 2018-12-06 DIAGNOSIS — F79 Unspecified intellectual disabilities: Secondary | ICD-10-CM | POA: Insufficient documentation

## 2018-12-06 DIAGNOSIS — I129 Hypertensive chronic kidney disease with stage 1 through stage 4 chronic kidney disease, or unspecified chronic kidney disease: Secondary | ICD-10-CM | POA: Diagnosis not present

## 2018-12-06 LAB — CBC WITH DIFFERENTIAL/PLATELET
Abs Immature Granulocytes: 0.15 10*3/uL — ABNORMAL HIGH (ref 0.00–0.07)
Basophils Absolute: 0.1 10*3/uL (ref 0.0–0.1)
Basophils Relative: 0 %
Eosinophils Absolute: 0 10*3/uL (ref 0.0–0.5)
Eosinophils Relative: 0 %
HCT: 34.4 % — ABNORMAL LOW (ref 39.0–52.0)
Hemoglobin: 10.2 g/dL — ABNORMAL LOW (ref 13.0–17.0)
Immature Granulocytes: 1 %
Lymphocytes Relative: 14 %
Lymphs Abs: 2.2 10*3/uL (ref 0.7–4.0)
MCH: 26.9 pg (ref 26.0–34.0)
MCHC: 29.7 g/dL — ABNORMAL LOW (ref 30.0–36.0)
MCV: 90.8 fL (ref 80.0–100.0)
Monocytes Absolute: 1.3 10*3/uL — ABNORMAL HIGH (ref 0.1–1.0)
Monocytes Relative: 9 %
Neutro Abs: 11.7 10*3/uL — ABNORMAL HIGH (ref 1.7–7.7)
Neutrophils Relative %: 76 %
Platelets: 333 10*3/uL (ref 150–400)
RBC: 3.79 MIL/uL — ABNORMAL LOW (ref 4.22–5.81)
RDW: 15.6 % — ABNORMAL HIGH (ref 11.5–15.5)
WBC: 15.4 10*3/uL — ABNORMAL HIGH (ref 4.0–10.5)
nRBC: 0 % (ref 0.0–0.2)

## 2018-12-06 LAB — BASIC METABOLIC PANEL
Anion gap: 8 (ref 5–15)
BUN: 32 mg/dL — ABNORMAL HIGH (ref 8–23)
CO2: 34 mmol/L — ABNORMAL HIGH (ref 22–32)
Calcium: 8.6 mg/dL — ABNORMAL LOW (ref 8.9–10.3)
Chloride: 96 mmol/L — ABNORMAL LOW (ref 98–111)
Creatinine, Ser: 1.83 mg/dL — ABNORMAL HIGH (ref 0.61–1.24)
GFR calc Af Amer: 43 mL/min — ABNORMAL LOW (ref >60)
GFR calc non Af Amer: 37 mL/min — ABNORMAL LOW (ref >60)
Glucose, Bld: 127 mg/dL — ABNORMAL HIGH (ref 70–99)
Potassium: 4.3 mmol/L (ref 3.5–5.1)
Sodium: 138 mmol/L (ref 135–145)

## 2018-12-06 MED ORDER — SODIUM CHLORIDE 0.9 % IV BOLUS
500.0000 mL | Freq: Once | INTRAVENOUS | Status: AC
  Administered 2018-12-06: 16:00:00 500 mL via INTRAVENOUS

## 2018-12-06 NOTE — ED Provider Notes (Signed)
[facility] EMERGENCY DEPARTMENT Provider Note   CSN: [id number] Arrival date & time: 12/06/18  1440     History   Chief Complaint Chief Complaint  Patient presents with  . Shortness of Breath    HPI [name] is a 68 y.o. male.     Patient was sent over from the group home because of a low pulse ox.  We checked it here in the emergency department it stayed above 95%.  Patient has a history of mental retardation and Parkinson's disease  The history is provided by the nursing home, the patient and medical records.  Shortness of Breath Severity:  Mild Onset quality:  Gradual Timing:  Constant Progression:  Unchanged Chronicity:  Chronic Context: activity   Relieved by:  Nothing Worsened by:  Nothing   Past Medical History:  Diagnosis Date  . Cellulitis   . Dementia ([place])   . Edema   . Hypertension   . Hypoglycemia   . Mental retardation   . Parkinson's disease ([place])   . Psychosis ([place])   . Renal insufficiency     Patient Active Problem List   Diagnosis Date Noted  . Sepsis secondary to UTI ([place]) 11/28/2018  . DVT (deep venous thrombosis) ([place]) 11/28/2018  . AKI (acute kidney injury) ([place]) 11/28/2018  . Cellulitis 10/13/2018  . Schizophrenia ([place]) 10/13/2018  . Parkinson disease ([place]) 10/13/2018  . Dementia ([place]) 10/13/2018  . Encounter for screening colonoscopy 01/30/2015    Past Surgical History:  Procedure Laterality Date  . COLONOSCOPY N/A 03/12/2015   Procedure: COLONOSCOPY;  Surgeon: Danie Binder, MD;  Location: [facility];  Service: Endoscopy;  Laterality: N/A;  1430 - moved to 1/16 @ 1:45 - office to notify        Home Medications    Prior to Admission medications   Medication Sig Start Date End Date Taking? Authorizing Provider  acetaminophen (Q-PAP) 500 MG tablet Take 1 tablet (500 mg total) by mouth every 6 (six) hours as needed for mild pain or fever. 12/03/18   Roxan Hockey, MD  busPIRone (BUSPAR) 15 MG tablet Take  1 tablet (15 mg total) by mouth 2 (two) times daily. 12/03/18   Roxan Hockey, MD  carbidopa-levodopa (SINEMET IR) 25-100 MG tablet Take 2 tablets by mouth 4 (four) times daily. 12/03/18   Roxan Hockey, MD  cephALEXin (KEFLEX) 500 MG capsule Take 1 capsule (500 mg total) by mouth 3 (three) times daily for 7 days. 12/03/18 12/10/18  Roxan Hockey, MD  clonazePAM (KLONOPIN) 0.5 MG tablet Take 0.5 tablets (0.25 mg total) by mouth daily as needed for anxiety. 12/03/18   Roxan Hockey, MD  cloZAPine (CLOZARIL) 50 MG tablet Take 1 tablet (50 mg total) by mouth at bedtime. 12/03/18   Roxan Hockey, MD  donepezil (ARICEPT) 10 MG tablet Take 1 tablet (10 mg total) by mouth daily. 12/03/18   Roxan Hockey, MD  magnesium oxide (MAG-OX) 400 MG tablet Take 1 tablet (400 mg total) by mouth 2 (two) times daily. 12/03/18   Roxan Hockey, MD  polyethylene glycol powder (GAVILAX) 17 GM/SCOOP powder Take 17 g by mouth daily. 12/03/18   Roxan Hockey, MD  potassium chloride SA (KLOR-CON) 20 MEQ tablet Take 1 tablet (20 mEq total) by mouth daily. 12/03/18   Roxan Hockey, MD  rasagiline (AZILECT) 1 MG TABS tablet Take 1 tablet (1 mg total) by mouth daily. 12/03/18   Roxan Hockey, MD  senna-docusate (SENOKOT-S) 8.6-50 MG tablet Take 2 tablets by mouth at  bedtime. 12/03/18   Roxan Hockey, MD  silver sulfADIAZINE (SILVADENE) 1 % cream Apply 1 application topically daily.    [provider]  tamsulosin (FLOMAX) 0.4 MG CAPS capsule Take 1 capsule (0.4 mg total) by mouth daily. 12/03/18   Roxan Hockey, MD  torsemide (DEMADEX) 20 MG tablet Take 1 tablet (20 mg total) by mouth daily. 12/03/18   Roxan Hockey, MD  warfarin (COUMADIN) 4 MG tablet Take 1 tablet (4 mg total) by mouth See admin instructions. Take 4 mg every evening--on to repeat INR on Monday, 12/06/2018 at which time Coumadin dose should be adjusted 12/03/18   Roxan Hockey, MD    Family History Family History  Problem  Relation Age of Onset  . Colon cancer Other        Unknown family history    Social History Social History   Tobacco Use  . Smoking status: Current Every Day Smoker  . Smokeless tobacco: Never Used  Substance Use Topics  . Alcohol use: No    Alcohol/week: 0.0 standard drinks  . Drug use: No     Allergies   Penicillins   Review of Systems Review of Systems  Unable to perform ROS: Dementia  Respiratory: Positive for shortness of breath.      Physical Exam Updated Vital Signs BP 112/64   Pulse (!) 41   Resp 17   SpO2 98%   Physical Exam Vitals signs and nursing note reviewed.  Constitutional:      Appearance: He is well-developed.  HENT:     Head: Normocephalic.     Nose: Nose normal.  Eyes:     General: No scleral icterus.    Conjunctiva/sclera: Conjunctivae normal.  Neck:     Musculoskeletal: Neck supple.     Thyroid: No thyromegaly.  Cardiovascular:     Rate and Rhythm: Normal rate and regular rhythm.     Heart sounds: No murmur. No friction rub. No gallop.   Pulmonary:     Breath sounds: No stridor. No wheezing or rales.  Chest:     Chest wall: No tenderness.  Abdominal:     General: There is no distension.     Tenderness: There is no abdominal tenderness. There is no rebound.  Musculoskeletal: Normal range of motion.  Lymphadenopathy:     Cervical: No cervical adenopathy.  Skin:    Findings: No erythema or rash.  Neurological:     Mental Status: He is alert.     Motor: No abnormal muscle tone.     Coordination: Coordination normal.     Comments: Oriented to person only  Psychiatric:        Mood and Affect: Mood normal.      ED Treatments / Results  Labs (all labs ordered are listed, but only abnormal results are displayed) Labs Reviewed  CBC WITH DIFFERENTIAL/PLATELET - Abnormal; Notable for the following components:      Result Value   WBC 15.4 (*)    RBC 3.79 (*)    Hemoglobin 10.2 (*)    HCT 34.4 (*)    MCHC 29.7 (*)    RDW  15.6 (*)    Neutro Abs 11.7 (*)    Monocytes Absolute 1.3 (*)    Abs Immature Granulocytes 0.15 (*)    All other components within normal limits  BASIC METABOLIC PANEL - Abnormal; Notable for the following components:   Chloride 96 (*)    CO2 34 (*)    Glucose, Bld 127 (*)  BUN 32 (*)    Creatinine, Ser 1.83 (*)    Calcium 8.6 (*)    GFR calc non Af Amer 37 (*)    GFR calc Af Amer 43 (*)    All other components within normal limits    EKG EKG Interpretation  Date/Time:  Monday December 06 2018 15:02:25 EDT Ventricular Rate:  86 PR Interval:    QRS Duration: 97 QT Interval:  343 QTC Calculation: 411 R Axis:   67 Text Interpretation:  Sinus rhythm Borderline short PR interval Confirmed by Milton Ferguson [phone] on 12/06/2018 4:26:57 PM   Radiology Dg Chest Port 1 View  Result Date: 12/06/2018 CLINICAL DATA:  Shortness of breath. EXAM: PORTABLE CHEST 1 VIEW COMPARISON:  November 28, 2018 FINDINGS: Cardiomediastinal silhouette is normal. Mediastinal contours appear intact. Chronic elevation of the left hemidiaphragm. There is no evidence of focal airspace consolidation, pleural effusion or pneumothorax. Osseous structures are without acute abnormality. Soft tissues are grossly normal. IMPRESSION: No active disease. Electronically Signed   By: Fidela Salisbury M.D.   On: 12/06/2018 15:24    Procedures Procedures (including critical care time)  Medications Ordered in ED Medications  sodium chloride 0.9 % bolus 500 mL (500 mLs Intravenous New Bag/Given 12/06/18 1551)     Initial Impression / Assessment and Plan / ED Course  I have reviewed the triage vital signs and the nursing notes.  Pertinent labs & imaging results that were available during my care of the patient were reviewed by me and considered in my medical decision making (see chart for details).        Chest x-ray unremarkable.  Patient with Parkinson's disease that seems stable.  Pulse ox normal here but  was low supposedly in the group home.  Patient will be sent back home Final Clinical Impressions(s) / ED Diagnoses   Final diagnoses:  Shortness of breath    ED Discharge Orders    None       Milton Ferguson, MD 12/06/18 1637

## 2018-12-06 NOTE — Discharge Instructions (Addendum)
Follow-up with your family doctor later this week.  Make sure you take all your medicines as prescribed

## 2018-12-06 NOTE — ED Notes (Signed)
ED Provider at bedside. 

## 2018-12-06 NOTE — ED Triage Notes (Signed)
Pt seen for same on 11/28/18.  Pt here for sob and low sats. On IV started.

## 2018-12-07 DIAGNOSIS — N179 Acute kidney failure, unspecified: Secondary | ICD-10-CM | POA: Diagnosis not present

## 2018-12-07 DIAGNOSIS — F329 Major depressive disorder, single episode, unspecified: Secondary | ICD-10-CM | POA: Diagnosis not present

## 2018-12-07 DIAGNOSIS — N289 Disorder of kidney and ureter, unspecified: Secondary | ICD-10-CM | POA: Diagnosis not present

## 2018-12-07 DIAGNOSIS — Z91120 Patient's intentional underdosing of medication regimen due to financial hardship: Secondary | ICD-10-CM | POA: Diagnosis not present

## 2018-12-07 DIAGNOSIS — T45516D Underdosing of anticoagulants, subsequent encounter: Secondary | ICD-10-CM | POA: Diagnosis not present

## 2018-12-07 DIAGNOSIS — F29 Unspecified psychosis not due to a substance or known physiological condition: Secondary | ICD-10-CM | POA: Diagnosis not present

## 2018-12-07 DIAGNOSIS — E162 Hypoglycemia, unspecified: Secondary | ICD-10-CM | POA: Diagnosis not present

## 2018-12-07 DIAGNOSIS — I10 Essential (primary) hypertension: Secondary | ICD-10-CM | POA: Diagnosis not present

## 2018-12-07 DIAGNOSIS — F0280 Dementia in other diseases classified elsewhere without behavioral disturbance: Secondary | ICD-10-CM | POA: Diagnosis not present

## 2018-12-07 DIAGNOSIS — R296 Repeated falls: Secondary | ICD-10-CM | POA: Diagnosis not present

## 2018-12-07 DIAGNOSIS — L0390 Cellulitis, unspecified: Secondary | ICD-10-CM | POA: Diagnosis not present

## 2018-12-07 DIAGNOSIS — Z9181 History of falling: Secondary | ICD-10-CM | POA: Diagnosis not present

## 2018-12-07 DIAGNOSIS — F209 Schizophrenia, unspecified: Secondary | ICD-10-CM | POA: Diagnosis not present

## 2018-12-07 DIAGNOSIS — Z79899 Other long term (current) drug therapy: Secondary | ICD-10-CM | POA: Diagnosis not present

## 2018-12-07 DIAGNOSIS — Z5181 Encounter for therapeutic drug level monitoring: Secondary | ICD-10-CM | POA: Diagnosis not present

## 2018-12-07 DIAGNOSIS — F79 Unspecified intellectual disabilities: Secondary | ICD-10-CM | POA: Diagnosis not present

## 2018-12-07 DIAGNOSIS — I82441 Acute embolism and thrombosis of right tibial vein: Secondary | ICD-10-CM | POA: Diagnosis not present

## 2018-12-07 DIAGNOSIS — Z7901 Long term (current) use of anticoagulants: Secondary | ICD-10-CM | POA: Diagnosis not present

## 2018-12-07 DIAGNOSIS — N390 Urinary tract infection, site not specified: Secondary | ICD-10-CM | POA: Diagnosis not present

## 2018-12-07 DIAGNOSIS — A4151 Sepsis due to Escherichia coli [E. coli]: Secondary | ICD-10-CM | POA: Diagnosis not present

## 2018-12-07 DIAGNOSIS — G20 Parkinson's disease: Secondary | ICD-10-CM | POA: Diagnosis not present

## 2018-12-09 DIAGNOSIS — Z5181 Encounter for therapeutic drug level monitoring: Secondary | ICD-10-CM | POA: Diagnosis not present

## 2018-12-10 DIAGNOSIS — I82401 Acute embolism and thrombosis of unspecified deep veins of right lower extremity: Secondary | ICD-10-CM | POA: Diagnosis not present

## 2018-12-10 DIAGNOSIS — Z5181 Encounter for therapeutic drug level monitoring: Secondary | ICD-10-CM | POA: Diagnosis not present

## 2018-12-10 DIAGNOSIS — G20 Parkinson's disease: Secondary | ICD-10-CM | POA: Diagnosis not present

## 2018-12-10 DIAGNOSIS — N179 Acute kidney failure, unspecified: Secondary | ICD-10-CM | POA: Diagnosis not present

## 2018-12-10 DIAGNOSIS — A419 Sepsis, unspecified organism: Secondary | ICD-10-CM | POA: Diagnosis not present

## 2019-01-06 DIAGNOSIS — F209 Schizophrenia, unspecified: Secondary | ICD-10-CM | POA: Diagnosis not present

## 2019-01-06 DIAGNOSIS — T45516D Underdosing of anticoagulants, subsequent encounter: Secondary | ICD-10-CM | POA: Diagnosis not present

## 2019-01-06 DIAGNOSIS — F329 Major depressive disorder, single episode, unspecified: Secondary | ICD-10-CM | POA: Diagnosis not present

## 2019-01-06 DIAGNOSIS — Z79899 Other long term (current) drug therapy: Secondary | ICD-10-CM | POA: Diagnosis not present

## 2019-01-06 DIAGNOSIS — Z5181 Encounter for therapeutic drug level monitoring: Secondary | ICD-10-CM | POA: Diagnosis not present

## 2019-01-06 DIAGNOSIS — E162 Hypoglycemia, unspecified: Secondary | ICD-10-CM | POA: Diagnosis not present

## 2019-01-06 DIAGNOSIS — R296 Repeated falls: Secondary | ICD-10-CM | POA: Diagnosis not present

## 2019-01-06 DIAGNOSIS — I82441 Acute embolism and thrombosis of right tibial vein: Secondary | ICD-10-CM | POA: Diagnosis not present

## 2019-01-06 DIAGNOSIS — N390 Urinary tract infection, site not specified: Secondary | ICD-10-CM | POA: Diagnosis not present

## 2019-01-06 DIAGNOSIS — G20 Parkinson's disease: Secondary | ICD-10-CM | POA: Diagnosis not present

## 2019-01-06 DIAGNOSIS — Z7901 Long term (current) use of anticoagulants: Secondary | ICD-10-CM | POA: Diagnosis not present

## 2019-01-06 DIAGNOSIS — Z91120 Patient's intentional underdosing of medication regimen due to financial hardship: Secondary | ICD-10-CM | POA: Diagnosis not present

## 2019-01-06 DIAGNOSIS — I10 Essential (primary) hypertension: Secondary | ICD-10-CM | POA: Diagnosis not present

## 2019-01-06 DIAGNOSIS — N179 Acute kidney failure, unspecified: Secondary | ICD-10-CM | POA: Diagnosis not present

## 2019-01-06 DIAGNOSIS — A4151 Sepsis due to Escherichia coli [E. coli]: Secondary | ICD-10-CM | POA: Diagnosis not present

## 2019-01-06 DIAGNOSIS — N289 Disorder of kidney and ureter, unspecified: Secondary | ICD-10-CM | POA: Diagnosis not present

## 2019-01-06 DIAGNOSIS — F29 Unspecified psychosis not due to a substance or known physiological condition: Secondary | ICD-10-CM | POA: Diagnosis not present

## 2019-01-06 DIAGNOSIS — F0280 Dementia in other diseases classified elsewhere without behavioral disturbance: Secondary | ICD-10-CM | POA: Diagnosis not present

## 2019-01-06 DIAGNOSIS — F79 Unspecified intellectual disabilities: Secondary | ICD-10-CM | POA: Diagnosis not present

## 2019-01-06 DIAGNOSIS — L0390 Cellulitis, unspecified: Secondary | ICD-10-CM | POA: Diagnosis not present

## 2019-01-06 DIAGNOSIS — Z9181 History of falling: Secondary | ICD-10-CM | POA: Diagnosis not present

## 2019-01-12 DIAGNOSIS — I82441 Acute embolism and thrombosis of right tibial vein: Secondary | ICD-10-CM | POA: Diagnosis not present

## 2019-01-12 DIAGNOSIS — N179 Acute kidney failure, unspecified: Secondary | ICD-10-CM | POA: Diagnosis not present

## 2019-01-12 DIAGNOSIS — G20 Parkinson's disease: Secondary | ICD-10-CM | POA: Diagnosis not present

## 2019-01-12 DIAGNOSIS — A4151 Sepsis due to Escherichia coli [E. coli]: Secondary | ICD-10-CM | POA: Diagnosis not present

## 2019-01-12 DIAGNOSIS — N390 Urinary tract infection, site not specified: Secondary | ICD-10-CM | POA: Diagnosis not present

## 2019-01-12 DIAGNOSIS — N289 Disorder of kidney and ureter, unspecified: Secondary | ICD-10-CM | POA: Diagnosis not present

## 2019-01-25 DIAGNOSIS — Z79899 Other long term (current) drug therapy: Secondary | ICD-10-CM | POA: Diagnosis not present

## 2019-02-10 ENCOUNTER — Encounter (HOSPITAL_COMMUNITY): Payer: Self-pay

## 2019-02-10 ENCOUNTER — Inpatient Hospital Stay (HOSPITAL_COMMUNITY)
Admission: EM | Admit: 2019-02-10 | Discharge: 2019-02-13 | DRG: 872 | Disposition: A | Discharge status: Home Health | Payer: Medicare Other | Attending: Family Medicine | Admitting: Family Medicine

## 2019-02-10 ENCOUNTER — Emergency Department (HOSPITAL_COMMUNITY): Payer: Medicare Other

## 2019-02-10 ENCOUNTER — Other Ambulatory Visit: Payer: Self-pay

## 2019-02-10 DIAGNOSIS — Z79899 Other long term (current) drug therapy: Secondary | ICD-10-CM

## 2019-02-10 DIAGNOSIS — F329 Major depressive disorder, single episode, unspecified: Secondary | ICD-10-CM | POA: Diagnosis present

## 2019-02-10 DIAGNOSIS — A419 Sepsis, unspecified organism: Principal | ICD-10-CM | POA: Diagnosis present

## 2019-02-10 DIAGNOSIS — E860 Dehydration: Secondary | ICD-10-CM | POA: Diagnosis present

## 2019-02-10 DIAGNOSIS — D649 Anemia, unspecified: Secondary | ICD-10-CM | POA: Diagnosis present

## 2019-02-10 DIAGNOSIS — Z880 Allergy status to penicillin: Secondary | ICD-10-CM

## 2019-02-10 DIAGNOSIS — Z7982 Long term (current) use of aspirin: Secondary | ICD-10-CM | POA: Diagnosis not present

## 2019-02-10 DIAGNOSIS — G20 Parkinson's disease: Secondary | ICD-10-CM | POA: Diagnosis present

## 2019-02-10 DIAGNOSIS — F0280 Dementia in other diseases classified elsewhere without behavioral disturbance: Secondary | ICD-10-CM | POA: Diagnosis present

## 2019-02-10 DIAGNOSIS — E162 Hypoglycemia, unspecified: Secondary | ICD-10-CM | POA: Diagnosis present

## 2019-02-10 DIAGNOSIS — Z800 Family history of malignant neoplasm of digestive organs: Secondary | ICD-10-CM

## 2019-02-10 DIAGNOSIS — N179 Acute kidney failure, unspecified: Secondary | ICD-10-CM | POA: Diagnosis present

## 2019-02-10 DIAGNOSIS — R6520 Severe sepsis without septic shock: Secondary | ICD-10-CM | POA: Diagnosis not present

## 2019-02-10 DIAGNOSIS — N189 Chronic kidney disease, unspecified: Secondary | ICD-10-CM

## 2019-02-10 DIAGNOSIS — Z8672 Personal history of thrombophlebitis: Secondary | ICD-10-CM

## 2019-02-10 DIAGNOSIS — R55 Syncope and collapse: Secondary | ICD-10-CM | POA: Diagnosis present

## 2019-02-10 DIAGNOSIS — I10 Essential (primary) hypertension: Secondary | ICD-10-CM | POA: Diagnosis present

## 2019-02-10 DIAGNOSIS — N3000 Acute cystitis without hematuria: Secondary | ICD-10-CM

## 2019-02-10 DIAGNOSIS — N390 Urinary tract infection, site not specified: Secondary | ICD-10-CM | POA: Diagnosis present

## 2019-02-10 DIAGNOSIS — Z20828 Contact with and (suspected) exposure to other viral communicable diseases: Secondary | ICD-10-CM | POA: Diagnosis present

## 2019-02-10 DIAGNOSIS — F17200 Nicotine dependence, unspecified, uncomplicated: Secondary | ICD-10-CM | POA: Diagnosis present

## 2019-02-10 DIAGNOSIS — I959 Hypotension, unspecified: Secondary | ICD-10-CM

## 2019-02-10 DIAGNOSIS — I129 Hypertensive chronic kidney disease with stage 1 through stage 4 chronic kidney disease, or unspecified chronic kidney disease: Secondary | ICD-10-CM | POA: Diagnosis present

## 2019-02-10 DIAGNOSIS — Z7901 Long term (current) use of anticoagulants: Secondary | ICD-10-CM | POA: Diagnosis not present

## 2019-02-10 DIAGNOSIS — F79 Unspecified intellectual disabilities: Secondary | ICD-10-CM | POA: Diagnosis present

## 2019-02-10 DIAGNOSIS — G9340 Encephalopathy, unspecified: Secondary | ICD-10-CM | POA: Diagnosis present

## 2019-02-10 DIAGNOSIS — K5900 Constipation, unspecified: Secondary | ICD-10-CM | POA: Diagnosis present

## 2019-02-10 LAB — CBC WITH DIFFERENTIAL/PLATELET
Abs Immature Granulocytes: 0.01 10*3/uL (ref 0.00–0.07)
Basophils Absolute: 0 10*3/uL (ref 0.0–0.1)
Basophils Relative: 0 %
Eosinophils Absolute: 0 10*3/uL (ref 0.0–0.5)
Eosinophils Relative: 0 %
HCT: 29.3 % — ABNORMAL LOW (ref 39.0–52.0)
Hemoglobin: 8.5 g/dL — ABNORMAL LOW (ref 13.0–17.0)
Immature Granulocytes: 0 %
Lymphocytes Relative: 25 %
Lymphs Abs: 1 10*3/uL (ref 0.7–4.0)
MCH: 27.4 pg (ref 26.0–34.0)
MCHC: 29 g/dL — ABNORMAL LOW (ref 30.0–36.0)
MCV: 94.5 fL (ref 80.0–100.0)
Monocytes Absolute: 0.4 10*3/uL (ref 0.1–1.0)
Monocytes Relative: 11 %
Neutro Abs: 2.5 10*3/uL (ref 1.7–7.7)
Neutrophils Relative %: 64 %
Platelets: 211 10*3/uL (ref 150–400)
RBC: 3.1 MIL/uL — ABNORMAL LOW (ref 4.22–5.81)
RDW: 15.3 % (ref 11.5–15.5)
WBC: 4 10*3/uL (ref 4.0–10.5)
nRBC: 0 % (ref 0.0–0.2)

## 2019-02-10 LAB — LACTIC ACID, PLASMA
Lactic Acid, Venous: 2.1 mmol/L (ref 0.5–1.9)
Lactic Acid, Venous: 1.6 mmol/L (ref 0.5–1.9)

## 2019-02-10 LAB — APTT: aPTT: 66 seconds — ABNORMAL HIGH (ref 24–36)

## 2019-02-10 LAB — URINALYSIS, ROUTINE W REFLEX MICROSCOPIC
Bacteria, UA: NONE SEEN
Bilirubin Urine: NEGATIVE
Glucose, UA: NEGATIVE mg/dL
Ketones, ur: NEGATIVE mg/dL
Nitrite: POSITIVE — AB
Protein, ur: NEGATIVE mg/dL
Specific Gravity, Urine: 1.011 (ref 1.005–1.030)
WBC, UA: >50 WBC/hpf — ABNORMAL HIGH (ref 0–5)
pH: 5 (ref 5.0–8.0)

## 2019-02-10 LAB — COMPREHENSIVE METABOLIC PANEL
ALT: 7 U/L (ref 0–44)
AST: 36 U/L (ref 15–41)
Albumin: 3 g/dL — ABNORMAL LOW (ref 3.5–5.0)
Alkaline Phosphatase: 86 U/L (ref 38–126)
Anion gap: 6 (ref 5–15)
BUN: 38 mg/dL — ABNORMAL HIGH (ref 8–23)
CO2: 29 mmol/L (ref 22–32)
Calcium: 8 mg/dL — ABNORMAL LOW (ref 8.9–10.3)
Chloride: 105 mmol/L (ref 98–111)
Creatinine, Ser: 2.02 mg/dL — ABNORMAL HIGH (ref 0.61–1.24)
GFR calc Af Amer: 38 mL/min — ABNORMAL LOW (ref >60)
GFR calc non Af Amer: 33 mL/min — ABNORMAL LOW (ref >60)
Glucose, Bld: 61 mg/dL — ABNORMAL LOW (ref 70–99)
Potassium: 4.1 mmol/L (ref 3.5–5.1)
Sodium: 140 mmol/L (ref 135–145)
Total Bilirubin: 0.7 mg/dL (ref 0.3–1.2)
Total Protein: 6.2 g/dL — ABNORMAL LOW (ref 6.5–8.1)

## 2019-02-10 LAB — PROTIME-INR
INR: 3.8 — ABNORMAL HIGH (ref 0.8–1.2)
Prothrombin Time: 37.7 seconds — ABNORMAL HIGH (ref 11.4–15.2)

## 2019-02-10 LAB — RESPIRATORY PANEL BY RT PCR (FLU A&B, COVID)
Influenza A by PCR: NEGATIVE
Influenza B by PCR: NEGATIVE
SARS Coronavirus 2 by RT PCR: NEGATIVE

## 2019-02-10 LAB — POC OCCULT BLOOD, ED: Fecal Occult Bld: NEGATIVE

## 2019-02-10 LAB — CBG MONITORING, ED
Glucose-Capillary: 145 mg/dL — ABNORMAL HIGH (ref 70–99)
Glucose-Capillary: 59 mg/dL — ABNORMAL LOW (ref 70–99)

## 2019-02-10 LAB — PROCALCITONIN: Procalcitonin: <0.1 ng/mL

## 2019-02-10 MED ORDER — RASAGILINE MESYLATE 1 MG PO TABS
1.0000 mg | ORAL_TABLET | Freq: Every day | ORAL | Status: DC
  Administered 2019-02-11 – 2019-02-13 (×3): 1 mg via ORAL
  Filled 2019-02-10 (×5): qty 1

## 2019-02-10 MED ORDER — BUSPIRONE HCL 5 MG PO TABS
15.0000 mg | ORAL_TABLET | Freq: Two times a day (BID) | ORAL | Status: DC
  Administered 2019-02-11 – 2019-02-13 (×5): 15 mg via ORAL
  Filled 2019-02-10 (×5): qty 3

## 2019-02-10 MED ORDER — DONEPEZIL HCL 5 MG PO TABS
10.0000 mg | ORAL_TABLET | Freq: Every day | ORAL | Status: DC
  Administered 2019-02-11 – 2019-02-13 (×3): 10 mg via ORAL
  Filled 2019-02-10: qty 1
  Filled 2019-02-10 (×3): qty 2

## 2019-02-10 MED ORDER — ACETAMINOPHEN 325 MG PO TABS: 650.0000 mg | ORAL_TABLET | Freq: Four times a day (QID) | ORAL | Status: DC | PRN

## 2019-02-10 MED ORDER — DOCUSATE SODIUM 100 MG PO CAPS
100.0000 mg | ORAL_CAPSULE | Freq: Two times a day (BID) | ORAL | Status: DC
  Administered 2019-02-11: 09:00:00 100 mg via ORAL
  Filled 2019-02-10: qty 1

## 2019-02-10 MED ORDER — CLOZAPINE 25 MG PO TABS
50.0000 mg | ORAL_TABLET | Freq: Every day | ORAL | Status: DC
  Administered 2019-02-11 – 2019-02-12 (×2): 50 mg via ORAL
  Filled 2019-02-10 (×3): qty 2

## 2019-02-10 MED ORDER — CLONAZEPAM 0.5 MG PO TABS
0.2500 mg | ORAL_TABLET | Freq: Two times a day (BID) | ORAL | Status: DC | PRN
  Administered 2019-02-11 – 2019-02-13 (×2): 0.25 mg via ORAL
  Filled 2019-02-10 (×2): qty 1

## 2019-02-10 MED ORDER — SODIUM CHLORIDE 0.9 % IV SOLN: INTRAVENOUS | Status: DC

## 2019-02-10 MED ORDER — ONDANSETRON HCL 4 MG PO TABS: 4.0000 mg | ORAL_TABLET | Freq: Four times a day (QID) | ORAL | Status: DC | PRN

## 2019-02-10 MED ORDER — SIMVASTATIN 20 MG PO TABS
20.0000 mg | ORAL_TABLET | Freq: Every day | ORAL | Status: DC
  Administered 2019-02-11 – 2019-02-13 (×3): 20 mg via ORAL
  Filled 2019-02-10 (×3): qty 1

## 2019-02-10 MED ORDER — DEXTROSE 50 % IV SOLN
INTRAVENOUS | Status: AC
  Administered 2019-02-10: 23:00:00 50 mL via INTRAVENOUS
  Filled 2019-02-10: qty 50

## 2019-02-10 MED ORDER — VANCOMYCIN HCL IN DEXTROSE 1-5 GM/200ML-% IV SOLN: 1000.0000 mg | Freq: Once | INTRAVENOUS | Status: DC

## 2019-02-10 MED ORDER — ASPIRIN EC 81 MG PO TBEC
81.0000 mg | DELAYED_RELEASE_TABLET | Freq: Every day | ORAL | Status: DC
  Administered 2019-02-11 – 2019-02-13 (×3): 81 mg via ORAL
  Filled 2019-02-10 (×3): qty 1

## 2019-02-10 MED ORDER — CARBIDOPA-LEVODOPA 25-100 MG PO TABS
2.0000 | ORAL_TABLET | Freq: Four times a day (QID) | ORAL | Status: DC
  Administered 2019-02-11 – 2019-02-13 (×10): 2 via ORAL
  Filled 2019-02-10 (×13): qty 2

## 2019-02-10 MED ORDER — SODIUM CHLORIDE 0.9 % IV BOLUS (SEPSIS): 1000.0000 mL | Freq: Once | INTRAVENOUS | Status: DC

## 2019-02-10 MED ORDER — ONDANSETRON HCL 4 MG/2ML IJ SOLN: 4.0000 mg | Freq: Four times a day (QID) | INTRAMUSCULAR | Status: DC | PRN

## 2019-02-10 MED ORDER — FLEET ENEMA 7-19 GM/118ML RE ENEM
1.0000 | ENEMA | Freq: Once | RECTAL | Status: AC
  Administered 2019-02-11: 01:00:00 1 via RECTAL

## 2019-02-10 MED ORDER — SODIUM CHLORIDE 0.9 % IV SOLN
2.0000 g | Freq: Two times a day (BID) | INTRAVENOUS | Status: DC
  Administered 2019-02-11 – 2019-02-13 (×6): 2 g via INTRAVENOUS
  Filled 2019-02-10 (×6): qty 2

## 2019-02-10 MED ORDER — SODIUM CHLORIDE 0.9 % IV BOLUS (SEPSIS)
1000.0000 mL | Freq: Once | INTRAVENOUS | Status: AC
  Administered 2019-02-10: 15:00:00 1000 mL via INTRAVENOUS

## 2019-02-10 MED ORDER — ACETAMINOPHEN 650 MG RE SUPP: 650.0000 mg | Freq: Four times a day (QID) | RECTAL | Status: DC | PRN

## 2019-02-10 MED ORDER — SODIUM CHLORIDE 0.9 % IV BOLUS
1000.0000 mL | Freq: Once | INTRAVENOUS | Status: AC
  Administered 2019-02-10: 14:00:00 1000 mL via INTRAVENOUS

## 2019-02-10 MED ORDER — ENSURE ENLIVE PO LIQD
237.0000 mL | Freq: Two times a day (BID) | ORAL | Status: DC
  Administered 2019-02-12 – 2019-02-13 (×3): 237 mL via ORAL
  Filled 2019-02-10 (×2): qty 237

## 2019-02-10 MED ORDER — ALBUTEROL SULFATE (2.5 MG/3ML) 0.083% IN NEBU: 2.5000 mg | INHALATION_SOLUTION | Freq: Four times a day (QID) | RESPIRATORY_TRACT | Status: DC | PRN

## 2019-02-10 MED ORDER — POLYETHYLENE GLYCOL 3350 17 GM/SCOOP PO POWD
17.0000 g | Freq: Two times a day (BID) | ORAL | Status: DC
  Filled 2019-02-10: qty 255

## 2019-02-10 MED ORDER — VANCOMYCIN HCL IN DEXTROSE 1-5 GM/200ML-% IV SOLN
1000.0000 mg | INTRAVENOUS | Status: DC
  Administered 2019-02-11: 15:00:00 1000 mg via INTRAVENOUS
  Filled 2019-02-10: qty 200

## 2019-02-10 MED ORDER — SENNOSIDES-DOCUSATE SODIUM 8.6-50 MG PO TABS
2.0000 | ORAL_TABLET | Freq: Every day | ORAL | Status: DC
  Filled 2019-02-10: qty 2

## 2019-02-10 MED ORDER — SODIUM CHLORIDE 0.9 % IV SOLN
2.0000 g | Freq: Once | INTRAVENOUS | Status: AC
  Administered 2019-02-10: 15:00:00 2 g via INTRAVENOUS
  Filled 2019-02-10: qty 2

## 2019-02-10 MED ORDER — METRONIDAZOLE IN NACL 5-0.79 MG/ML-% IV SOLN
500.0000 mg | Freq: Once | INTRAVENOUS | Status: AC
  Administered 2019-02-10: 15:00:00 500 mg via INTRAVENOUS
  Filled 2019-02-10: qty 100

## 2019-02-10 MED ORDER — AMANTADINE HCL 100 MG PO CAPS
100.0000 mg | ORAL_CAPSULE | Freq: Two times a day (BID) | ORAL | Status: DC
  Administered 2019-02-11 – 2019-02-13 (×5): 100 mg via ORAL
  Filled 2019-02-10 (×10): qty 1

## 2019-02-10 MED ORDER — VANCOMYCIN HCL 1500 MG/300ML IV SOLN
1500.0000 mg | Freq: Once | INTRAVENOUS | Status: AC
  Administered 2019-02-10: 15:00:00 1500 mg via INTRAVENOUS
  Filled 2019-02-10: qty 300

## 2019-02-10 MED ORDER — TAMSULOSIN HCL 0.4 MG PO CAPS
0.4000 mg | ORAL_CAPSULE | Freq: Every day | ORAL | Status: DC
  Administered 2019-02-11 – 2019-02-13 (×3): 0.4 mg via ORAL
  Filled 2019-02-10 (×3): qty 1

## 2019-02-10 MED ORDER — OXYCODONE HCL 5 MG PO TABS: 5.0000 mg | ORAL_TABLET | ORAL | Status: DC | PRN

## 2019-02-10 MED ORDER — DEXTROSE 50 % IV SOLN
1.0000 | Freq: Once | INTRAVENOUS | Status: AC
  Administered 2019-02-10: 17:00:00 50 mL via INTRAVENOUS
  Filled 2019-02-10: qty 50

## 2019-02-10 NOTE — ED Provider Notes (Addendum)
[facility] Provider Note   CSN: [id number] Arrival date & time: 02/10/19  1337     History Chief Complaint  Patient presents with  . Altered Mental Status    [name] is a 68 y.o. male who goes by "[name]" with history of Parkinson's disease, HTN, and dementia who presents with a syncopal episode/AMS. History is provided by his caregiver who has been taking care of him since 1997, Mrs. [name]. She states that he was in his usual state of health this morning. He was eating lunch and finished. Just after he suddenly "went limp", wasn't responding, his eyes rolled in the back of his head. There was no noted seizure activity. EMS was called and glucose was noted to be 62 and was given oral glucose. Soon after he became alert and was talking again. Mrs. [name] notes that he was diagnosed with a DVT in September and has been on Coumadin. There has not been any noted fever, vomiting, diarrhea, or bleeding issues. The patient is normally ambulatory at baseline without use of an assistive device. He is able to "talk" at baseline but usually it's not intelligible.   LEVEL 5 caveat due to AMS/dementia  HPI    Past Medical History:  Diagnosis Date  . Cellulitis   . Dementia ([place])   . Edema   . Hypertension   . Hypoglycemia   . Mental retardation   . Parkinson's disease ([place])   . Psychosis ([place])   . Renal insufficiency     Patient Active Problem List   Diagnosis Date Noted  . Sepsis secondary to UTI ([place]) 11/28/2018  . DVT (deep venous thrombosis) ([place]) 11/28/2018  . AKI (acute kidney injury) ([place]) 11/28/2018  . Cellulitis 10/13/2018  . Schizophrenia ([place]) 10/13/2018  . Parkinson disease ([place]) 10/13/2018  . Dementia ([place]) 10/13/2018  . Encounter for screening colonoscopy 01/30/2015    Past Surgical History:  Procedure Laterality Date  . COLONOSCOPY N/A 03/12/2015   Procedure: COLONOSCOPY;  Surgeon: Danie Binder, MD;  Location[facility];  Service: Endoscopy;  Laterality: N/A;  1430 - moved to 1/16 @ 1:45 - office to notify       Family History  Problem Relation Age of Onset  . Colon cancer Other        Unknown family history    Social History   Tobacco Use  . Smoking status: Current Every Day Smoker  . Smokeless tobacco: Never Used  Substance Use Topics  . Alcohol use: No    Alcohol/week: 0.0 standard drinks  . Drug use: No    Home Medications Prior to Admission medications   Medication Sig Start Date End Date Taking? Authorizing Provider  acetaminophen (Q-PAP) 500 MG tablet Take 1 tablet (500 mg total) by mouth every 6 (six) hours as needed for mild pain or fever. 12/03/18   Roxan Hockey, MD  busPIRone (BUSPAR) 15 MG tablet Take 1 tablet (15 mg total) by mouth 2 (two) times daily. 12/03/18   Roxan Hockey, MD  carbidopa-levodopa (SINEMET IR) 25-100 MG tablet Take 2 tablets by mouth 4 (four) times daily. 12/03/18   Roxan Hockey, MD  clonazePAM (KLONOPIN) 0.5 MG tablet Take 0.5 tablets (0.25 mg total) by mouth daily as needed for anxiety. 12/03/18   Roxan Hockey, MD  cloZAPine (CLOZARIL) 50 MG tablet Take 1 tablet (50 mg total) by mouth at bedtime. 12/03/18   Roxan Hockey, MD  donepezil (ARICEPT) 10 MG tablet Take 1 tablet (10  mg total) by mouth daily. 12/03/18   Roxan Hockey, MD  magnesium oxide (MAG-OX) 400 MG tablet Take 1 tablet (400 mg total) by mouth 2 (two) times daily. 12/03/18   Roxan Hockey, MD  polyethylene glycol powder (GAVILAX) 17 GM/SCOOP powder Take 17 g by mouth daily. 12/03/18   Roxan Hockey, MD  potassium chloride SA (KLOR-CON) 20 MEQ tablet Take 1 tablet (20 mEq total) by mouth daily. 12/03/18   Roxan Hockey, MD  rasagiline (AZILECT) 1 MG TABS tablet Take 1 tablet (1 mg total) by mouth daily. 12/03/18   Emokpae, Courage, MD  senna-docusate (SENOKOT-S) 8.6-50 MG tablet Take 2 tablets by mouth at bedtime. 12/03/18   Roxan Hockey, MD  silver sulfADIAZINE  (SILVADENE) 1 % cream Apply 1 application topically daily.    [provider]  tamsulosin (FLOMAX) 0.4 MG CAPS capsule Take 1 capsule (0.4 mg total) by mouth daily. 12/03/18   Roxan Hockey, MD  torsemide (DEMADEX) 20 MG tablet Take 1 tablet (20 mg total) by mouth daily. 12/03/18   Roxan Hockey, MD  warfarin (COUMADIN) 4 MG tablet Take 1 tablet (4 mg total) by mouth See admin instructions. Take 4 mg every evening--on to repeat INR on Monday, 12/06/2018 at which time Coumadin dose should be adjusted 12/03/18   Roxan Hockey, MD    Allergies    Penicillins  Review of Systems   Review of Systems  Unable to perform ROS: Dementia    Physical Exam Updated Vital Signs BP (!) 71/48 (BP Location: Right Arm)   Pulse (!) 51   Resp 18   Ht 6' (1.829 m)   Wt 63 kg   SpO2 93%   BMI 18.82 kg/m   Physical Exam Vitals and nursing note reviewed.  Constitutional:      General: He is not in acute distress.    Appearance: He is well-developed.  HENT:     Head: Normocephalic and atraumatic.  Eyes:     General: No scleral icterus.       Right eye: No discharge.        Left eye: No discharge.     Conjunctiva/sclera: Conjunctivae normal.     Pupils: Pupils are equal, round, and reactive to light.  Cardiovascular:     Rate and Rhythm: Normal rate and regular rhythm.  Pulmonary:     Effort: Pulmonary effort is normal. No respiratory distress.     Breath sounds: Normal breath sounds.  Abdominal:     General: There is no distension.     Palpations: Abdomen is soft.     Tenderness: There is no abdominal tenderness.  Genitourinary:    Comments: Rectal: Hard stool balls in rectum. No gross blood or melena Musculoskeletal:     Cervical back: Normal range of motion.     Right lower leg: Edema present.     Left lower leg: Edema present.     Comments: Bilateral peripheral edema R>L. Scabs over the R proximal lower leg with minimal surrounding erythema. Pedal pulses were not palpated    Skin:    General: Skin is warm and dry.  Neurological:     Mental Status: He is alert and oriented to person, place, and time.  Psychiatric:        Behavior: Behavior normal.     ED Results / Procedures / Treatments   Labs (all labs ordered are listed, but only abnormal results are displayed) Labs Reviewed  LACTIC ACID, PLASMA - Abnormal; Notable for the following components:  Result Value   Lactic Acid, Venous 2.1 (*)    All other components within normal limits  COMPREHENSIVE METABOLIC PANEL - Abnormal; Notable for the following components:   Glucose, Bld 61 (*)    BUN 38 (*)    Creatinine, Ser 2.02 (*)    Calcium 8.0 (*)    Total Protein 6.2 (*)    Albumin 3.0 (*)    GFR calc non Af Amer 33 (*)    GFR calc Af Amer 38 (*)    All other components within normal limits  CBC WITH DIFFERENTIAL/PLATELET - Abnormal; Notable for the following components:   RBC 3.10 (*)    Hemoglobin 8.5 (*)    HCT 29.3 (*)    MCHC 29.0 (*)    All other components within normal limits  APTT - Abnormal; Notable for the following components:   aPTT 66 (*)    All other components within normal limits  PROTIME-INR - Abnormal; Notable for the following components:   Prothrombin Time 37.7 (*)    INR 3.8 (*)    All other components within normal limits  CULTURE, BLOOD (ROUTINE X 2)  CULTURE, BLOOD (ROUTINE X 2)  RESPIRATORY PANEL BY RT PCR (FLU A&B, COVID)  URINE CULTURE  LACTIC ACID, PLASMA  URINALYSIS, ROUTINE W REFLEX MICROSCOPIC  CBG MONITORING, ED  POC OCCULT BLOOD, ED    EKG EKG Interpretation  Date/Time:  Thursday February 10 2019 14:13:08 EST Ventricular Rate:  50 PR Interval:    QRS Duration: 116 QT Interval:  495 QTC Calculation: 452 R Axis:   65 Text Interpretation: Sinus rhythm Incomplete left bundle branch block No STEMI Confirmed by Nanda Quinton [phone]) on 02/10/2019 2:14:33 PM   Radiology CT Abdomen Pelvis Wo Contrast  Result Date: 02/10/2019 CLINICAL DATA:   Abdominal distension.  Dementia. EXAM: CT ABDOMEN AND PELVIS WITHOUT CONTRAST TECHNIQUE: Multidetector CT imaging of the abdomen and pelvis was performed following the standard protocol without IV contrast. COMPARISON:  None. FINDINGS: Extremely limited examination due to lack of IV and oral contrast, patient motion and arms being down by his side. Lower chest: A few patchy areas of inflammation or possible infection along with bibasilar atelectasis. No pleural effusions. The heart is within normal limits in size. No pericardial effusion. Hepatobiliary: No gross abnormality involving the liver is identified. The gallbladder appears normal. Pancreas: Very limited examination.  No obvious abnormality. Spleen: Normal size.  No gross abnormality. Adrenals/Urinary Tract: Very limited examination. No gross abnormality. I do not see any definite renal calculi or hydronephrosis. Possible right adrenal gland lesion Stomach/Bowel: Markedly distended: Containing large amount of stool all the way down to the rectum. Findings suggest chronic constipation and fecal impaction. There also air distended loops of small bowel, likely diffuse ileus. Vascular/Lymphatic: The aorta is normal in caliber. No obvious large mesenteric or retroperitoneal mass. Reproductive: The prostate gland and seminal vesicles are grossly normal. Other: No obvious pelvic mass or large free pelvic fluid collection. Diffuse body wall edema suggesting anasarca. Musculoskeletal: No significant bony findings. IMPRESSION: 1. Extremely limited CT scan as discussed above. 2. Stool-filled distended colon suggesting chronic constipation, fecal impaction and probable colonic ileus. Small bowel ileus is also suspected. 3. No obvious/gross abnormality involving the solid abdominal organs but examination is limited. 4. Possible right adrenal gland lesion. 5. Patchy inflammatory or infectious findings at the lung bases along with atelectasis. Electronically Signed   By:  Marijo Sanes M.D.   On: 02/10/2019 17:11   CT Head  Wo Contrast  Result Date: 02/10/2019 CLINICAL DATA:  Encephalopathy. Additional provided: Patient "slumped over" during lunch with speech difficulty. EXAM: CT HEAD WITHOUT CONTRAST TECHNIQUE: Contiguous axial images were obtained from the base of the skull through the vertex without intravenous contrast. COMPARISON:  Head CT 11/23/2018 FINDINGS: Brain: No evidence of acute intracranial hemorrhage. No demarcated cortical infarction. No evidence of intracranial mass. No midline shift or extra-axial fluid collection. Ill-defined hypoattenuation within the cerebral white matter is nonspecific, but consistent with chronic small vessel ischemic disease. Mild generalized parenchymal atrophy. Vascular: No hyperdense vessel. Skull: No calvarial fracture or suspicious osseous lesion. Sinuses/Orbits: Visualized orbits demonstrate no acute abnormality. A fracture deformity of the left nasal bone appears to have been present on prior head CT 10/20/2004, although displacement may be somewhat increased since that time. Partial opacification of right ethmoid air cells. No significant mastoid effusion. IMPRESSION: No evidence of acute intracranial abnormality. Generalized parenchymal atrophy and chronic small vessel ischemic disease. A fracture deformity of the left nasal bone appears to have been present on prior head CT 10/20/2004, although displacement may be somewhat increased since that time. Clinical correlation is recommended. Partial opacification of right ethmoid air cells. Correlate for acute sinusitis. Electronically Signed   By: Kellie Simmering DO   On: 02/10/2019 17:10   DG Chest Port 1 View  Result Date: 02/10/2019 CLINICAL DATA:  Altered mental status and hypotension EXAM: PORTABLE CHEST 1 VIEW COMPARISON:  December 06, 2018 FINDINGS: There is persistent elevation of the left hemidiaphragm. No edema or consolidation. Heart size and pulmonary vascularity are  normal. No adenopathy. Visualized bowel loops in the upper abdomen appear distended. IMPRESSION: No edema or consolidation. Heart size normal. Elevation of left hemidiaphragm again noted. Visualized bowel appear somewhat distended. It may be prudent to correlate with abdominal imaging to further assess in this regard. Electronically Signed   By: Lowella Grip III M.D.   On: 02/10/2019 14:28    Procedures Procedures (including critical care time)   CRITICAL CARE Performed by: Recardo Evangelist   Total critical care time: 45 minutes  Critical care time was exclusive of separately billable procedures and treating other patients.  Critical care was necessary to treat or prevent imminent or life-threatening deterioration.  Critical care was time spent personally by me on the following activities: development of treatment plan with patient and/or surrogate as well as nursing, discussions with consultants, evaluation of patient's response to treatment, examination of patient, obtaining history from patient or surrogate, ordering and performing treatments and interventions, ordering and review of laboratory studies, ordering and review of radiographic studies, pulse oximetry and re-evaluation of patient's condition.  Medications Ordered in ED Medications  vancomycin (VANCOCIN) IVPB 1000 mg/200 mL premix (has no administration in time range)  ceFEPIme (MAXIPIME) 2 g in sodium chloride 0.9 % 100 mL IVPB (has no administration in time range)  sodium chloride 0.9 % bolus 1,000 mL (0 mLs Intravenous Stopped 02/10/19 1632)  sodium chloride 0.9 % bolus 1,000 mL (0 mLs Intravenous Stopped 02/10/19 1638)  ceFEPIme (MAXIPIME) 2 g in sodium chloride 0.9 % 100 mL IVPB (0 g Intravenous Stopped 02/10/19 1632)  metroNIDAZOLE (FLAGYL) IVPB 500 mg (0 mg Intravenous Stopped 02/10/19 1638)  vancomycin (VANCOREADY) IVPB 1500 mg/300 mL (0 mg Intravenous Stopped 02/10/19 1740)  dextrose 50 % solution 50 mL (50 mLs  Intravenous Given 02/10/19 1638)    ED Course  I have reviewed the triage vital signs and the nursing notes.  Pertinent labs & imaging  results that were available during my care of the patient were reviewed by me and considered in my medical decision making (see chart for details).  68 year old male presents with loss of consciousness and hypotension at his group home today. BP is ~70/40 here and rectal temp was obtaind and his temp is 92. Bair hugger and fluids ordered. DDx: Sepsis, arrhythmia/valvular, hypoglycemia, anemia, hypovolemia, SAH, dissection, PE, AAA rupture, ectopic rupture, seizure, vasovagal, orthostatic, drugs (meds, ETOH, illicit drugs). On exam he is talkative but it's difficult to understand him. This is apparently his baseline. EKG is sinus bradycardia with a lot of artifact. Code sepsis initiated and order set utilized  CBC is remarkable for worsening anemia (hgb 8.5). Rectal exam is remarkable for no active bleeding but he did have hard stool balls. CMP is remarkable for mildly worse kidney function (SCr 2.0). He looks overall malnourished with low glucose (61), low Ca (8.0), low protein (6.2). Initial lactate was 2.1 and blood cultures obtained. Due to unclear source he was given broad spectrum antibiotics.  CXR shows clear lungs but possible distended bowels. Will order CT abdomen/pelvis. CT head ordered as well due to concern for AMS.  CT head negative. CT abdomen/pelvis is remarkable for possible lung infection and ileus with constipation. COVID is negative. UA is consistent with UTI and urine culture sent. BP has responded to fluid boluses. Discussed with Dr. Clearence Ped with [facility] who will admit for sepsis due to possible pneumonia and UTI.   MDM Rules/Calculators/A&P   Final Clinical Impression(s) / ED Diagnoses Final diagnoses:  Hypotension, unspecified hypotension type  Sepsis, due to unspecified organism, unspecified whether acute organ dysfunction present  ([place])  Chronic kidney disease, unspecified CKD stage  Constipation, unspecified constipation type  Hypoglycemia    Rx / DC Orders ED Discharge Orders    None       Recardo Evangelist, PA-C 02/10/19 1944    Recardo Evangelist, PA-C 02/10/19 1950    Margette Fast, MD 02/14/19 [phone]

## 2019-02-10 NOTE — Progress Notes (Signed)
Pharmacy Antibiotic Note  [name] is a 68 y.o. male admitted on 02/10/2019 with sepsis.  Pharmacy has been consulted for vancomycin and cefepime dosing.  Plan: Vancomycin 1000mg  IV every 24 hours.  Goal trough 15-20 mcg/mL. cefepime 2gm iv q12h   Height: 6' (182.9 cm) Weight: 138 lb 12.8 oz (63 kg) IBW/kg (Calculated) : 77.6  Temp (24hrs), Avg:92.7 F (33.7 C), Min:92.7 F (33.7 C), Max:92.7 F (33.7 C)  Recent Labs  Lab 02/10/19 1507 02/10/19 1508  WBC  --  4.0  CREATININE  --  2.02*  LATICACIDVEN 2.1*  --     Estimated Creatinine Clearance: 30.8 mL/min (A) (by C-G formula based on SCr of 2.02 mg/dL (H)).    Allergies  Allergen Reactions  . Penicillins     .Did it involve swelling of the face/tongue/throat, SOB, or low BP? Unknown Did it involve sudden or severe rash/hives, skin peeling, or any reaction on the inside of your mouth or nose? Unknown Did you need to seek medical attention at a hospital or doctor's office? Unknown When did it last happen?Unknown If all above answers are "NO", may proceed with cephalosporin use.     Antimicrobials this admission: 12/17 cefepime >>  12/17 vancomycin >>   Microbiology results: 12/17 BCx: sent 12/17 UCx: sent   12/17 Covid 19: negative   Thank you for allowing pharmacy to be a part of this patient's care.  Donna Christen Jude Naclerio 02/10/2019 4:29 PM

## 2019-02-10 NOTE — H&P (Signed)
TRH H&P    Patient Demographics:    [name], is a 68 y.o. male  MRN: [id number]  DOB - [birth year]  Admit Date - 02/10/2019  Referring MD/NP/PA: Claiborne Billings  Outpatient Primary MD for the patient is Rosita Fire, MD  Patient coming from: [place]  Chief complaint- Syncopal episode   HPI:    [name]  is a 68 y.o. male, with a history of Parkinson's, cognitive delay, HTN, and more who presents today with a chief complaint of syncope. Patient himself is not able to give much of a history. He has some difficulty with clear speech at baseline. He is able to answer yes and no questions about what he is feeling in the moment. It is unclear if his answers about symptoms that he has felt up until this point are accurate or not. ED provider spoke with staff at his group home who reported that he was in his normal state of health when he slumped over in his chair at lunch time, and was more difficult than normal to understand. EMS was called and reported hypoglycemia of 62 when they arrived on scene. Patient was given two glucose tabs en route. In the ED his glucose was 61 and he was given an amp of dextrose. This reportedly improved his mental status. Patient complains of no pain right now, and reports that he does not remember his syncopal episode.   ED course WCB 4.0, BP 71/48, T 92.7 (rectally) Code sepsis called 6ml/kg bolus Cefepime, flagyl and Vanc CXR shows  No edema or consolidation. Heart size normal. Elevation of left hemidiaphragm again noted. Visualized bowel appear somewhat distended. It may be prudent to correlate with abdominal imaging to further assess in this regard. CT abdomen - 1. Extremely limited CT scan as discussed above. 2. Stool-filled distended colon suggesting chronic constipation, fecal impaction and probable colonic ileus. Small bowel ileus is also  suspected. 3. No obvious/gross abnormality involving the solid abdominal organs but examination is limited. 4. Possible right adrenal gland lesion. 5. Patchy inflammatory or infectious findings at the lung bases along with atelectasis. CT head - No evidence of acute intracranial abnormality. Generalized parenchymal atrophy and chronic small vessel ischemic disease. A fracture deformity of the left nasal bone appears to have been present on prior head CT 10/20/2004, although displacement may be somewhat increased since that time. Clinical correlation is recommended. Partial opacification of right ethmoid air cells. Correlate for acute sinusitis. Hgb 8.5 FOBT negative INR 3.8 (on warfarin for DVT) Urine - mod leuks, positive nitrites, >50 urine WBC Urine culture pending Blood culture pending Warming blanket applied      Review of systems:    In addition to the HPI above - patient answered pan-negative to ROS - reliability of memory is unclear No Fever-chills, No Headache, No changes with Vision or hearing, No problems swallowing food or Liquids, No Chest pain, Cough or Shortness of Breath, No Abdominal pain, No Nausea or Vomiting, bowel movements are regular, No Blood in stool or Urine, No dysuria, No new  skin rashes or bruises, No new joints pains-aches,  No new weakness, tingling, numbness in any extremity, No recent weight gain or loss, No polyuria, polydypsia or polyphagia, No significant Mental Stressors.  All other systems reviewed and are negative.    Past History of the following :    Past Medical History:  Diagnosis Date  . Cellulitis   . Dementia ([place])   . Edema   . Hypertension   . Hypoglycemia   . Mental retardation   . Parkinson's disease ([place])   . Psychosis ([place])   . Renal insufficiency       Past Surgical History:  Procedure Laterality Date  . COLONOSCOPY N/A 03/12/2015   Procedure: COLONOSCOPY;  Surgeon: Danie Binder, MD;  [facility]  [facility];  Service: Endoscopy;  Laterality: N/A;  1430 - moved to 1/16 @ 1:45 - office to notify      Social History:      Social History   Tobacco Use  . Smoking status: Current Every Day Smoker  . Smokeless tobacco: Never Used  Substance Use Topics  . Alcohol use: No    Alcohol/week: 0.0 standard drinks       Family History :     Family History  Problem Relation Age of Onset  . Colon cancer Other        Unknown family history   Patient is not able to answer family history questions    Home Medications:   Prior to Admission medications   Medication Sig Start Date End Date Taking? Authorizing Provider  acetaminophen (Q-PAP) 500 MG tablet Take 1 tablet (500 mg total) by mouth every 6 (six) hours as needed for mild pain or fever. 12/03/18  Yes Emokpae, Courage, MD  amantadine (SYMMETREL) 100 MG capsule Take 100 mg by mouth 2 (two) times daily.   Yes [provider]  aspirin EC 81 MG tablet Take 81 mg by mouth daily.   Yes [provider]  busPIRone (BUSPAR) 15 MG tablet Take 1 tablet (15 mg total) by mouth 2 (two) times daily. 12/03/18  Yes Emokpae, Courage, MD  carbidopa-levodopa (SINEMET IR) 25-100 MG tablet Take 2 tablets by mouth 4 (four) times daily. 12/03/18  Yes Emokpae, Courage, MD  clonazePAM (KLONOPIN) 0.5 MG tablet Take 0.5 tablets (0.25 mg total) by mouth daily as needed for anxiety. 12/03/18  Yes Emokpae, Courage, MD  cloZAPine (CLOZARIL) 50 MG tablet Take 1 tablet (50 mg total) by mouth at bedtime. 12/03/18  Yes Emokpae, Courage, MD  donepezil (ARICEPT) 10 MG tablet Take 1 tablet (10 mg total) by mouth daily. 12/03/18  Yes Emokpae, Courage, MD  Ensure (ENSURE) Take 237 mLs by mouth 2 (two) times daily.   Yes [provider]  magnesium oxide (MAG-OX) 400 MG tablet Take 1 tablet (400 mg total) by mouth 2 (two) times daily. 12/03/18  Yes Emokpae, Courage, MD  nystatin cream (MYCOSTATIN) Apply 1 application topically 2 (two) times daily.  Applied to feet   Yes [provider]  polyethylene glycol powder (GAVILAX) 17 GM/SCOOP powder Take 17 g by mouth daily. 12/03/18  Yes Emokpae, Courage, MD  potassium chloride SA (KLOR-CON) 20 MEQ tablet Take 1 tablet (20 mEq total) by mouth daily. 12/03/18  Yes Emokpae, Courage, MD  psyllium (REGULOID) 0.52 g capsule Take 1.04 g by mouth every morning.   Yes [provider]  rasagiline (AZILECT) 1 MG TABS tablet Take 1 tablet (1 mg total) by mouth daily. 12/03/18  Yes Emokpae, Courage,  MD  senna-docusate (SENOKOT-S) 8.6-50 MG tablet Take 2 tablets by mouth at bedtime. 12/03/18  Yes Emokpae, Courage, MD  simvastatin (ZOCOR) 20 MG tablet Take 20 mg by mouth daily.   Yes [provider]  tamsulosin (FLOMAX) 0.4 MG CAPS capsule Take 1 capsule (0.4 mg total) by mouth daily. 12/03/18  Yes Emokpae, Courage, MD  torsemide (DEMADEX) 20 MG tablet Take 1 tablet (20 mg total) by mouth daily. 12/03/18  Yes Emokpae, Courage, MD  warfarin (COUMADIN) 5 MG tablet Take 2.5 mg by mouth every evening.   Yes [provider]  warfarin (COUMADIN) 4 MG tablet Take 1 tablet (4 mg total) by mouth See admin instructions. Take 4 mg every evening--on to repeat INR on Monday, 12/06/2018 at which time Coumadin dose should be adjusted Patient not taking: Reported on 02/10/2019 12/03/18   Roxan Hockey, MD     Allergies:     Allergies  Allergen Reactions  . Penicillins     .Did it involve swelling of the face/tongue/throat, SOB, or low BP? Unknown Did it involve sudden or severe rash/hives, skin peeling, or any reaction on the inside of your mouth or nose? Unknown Did you need to seek medical attention at a hospital or doctor's office? Unknown When did it last happen?Unknown If all above answers are "NO", may proceed with cephalosporin use.      Physical Exam:   Vitals  Blood pressure 125/80, pulse (!) 52, temperature (!) 92.7 F (33.7 C), temperature source Rectal, resp. rate  10, height 6' (1.829 m), weight 63 kg, SpO2 100 %.  1.  General: Lying right lateral decubitus in bed in no acute distress  2. Psychiatric: Cooperative, but becomes anxious with open ended questions. Answers yes/no questions easily  3. Neurologic: Cranial nerves II-XII grossly in tact Cooperation with neuro exam is limited  4. HEENMT:  Head is atraumatic normocephalic Crusting around right eye EOMI Neck is cachectic Trachea is midline  5. Respiratory : LCTABL   6. Cardiovascular : HRRR Normal pulses  7. Gastrointestinal:  Abdomen is soft, non distended, and non tender  8. Skin:  No acute lesion on limited skin exam  9.Musculoskeletal:  2+ pitting edema on LE BL    Data Review:    CBC Recent Labs  Lab 02/10/19 1508  WBC 4.0  HGB 8.5*  HCT 29.3*  PLT 211  MCV 94.5  MCH 27.4  MCHC 29.0*  RDW 15.3  LYMPHSABS 1.0  MONOABS 0.4  EOSABS 0.0  BASOSABS 0.0   ------------------------------------------------------------------------------------------------------------------  Results for orders placed or performed during the hospital encounter of 02/10/19 (from the past 48 hour(s))  Urinalysis, Routine w reflex microscopic     Status: Abnormal   Collection Time: 02/10/19  2:05 PM  Result Value Ref Range   Color, Urine STRAW (A) YELLOW   APPearance CLEAR CLEAR   Specific Gravity, Urine 1.011 1.005 - 1.030   pH 5.0 5.0 - 8.0   Glucose, UA NEGATIVE NEGATIVE mg/dL   Hgb urine dipstick SMALL (A) NEGATIVE   Bilirubin Urine NEGATIVE NEGATIVE   Ketones, ur NEGATIVE NEGATIVE mg/dL   Protein, ur NEGATIVE NEGATIVE mg/dL   Nitrite POSITIVE (A) NEGATIVE   Leukocytes,Ua MODERATE (A) NEGATIVE   RBC / HPF 0-5 0 - 5 RBC/hpf   WBC, UA >50 (H) 0 - 5 WBC/hpf   Bacteria, UA NONE SEEN NONE SEEN   Mucus PRESENT     Comment: Performed at [facility], [address], [place], [place] [address]  Respiratory Panel by RT PCR (Flu A&B, Covid) - Nasopharyngeal Swab     Status: None     Collection Time: 02/10/19  2:44 PM   Specimen: Nasopharyngeal Swab  Result Value Ref Range   SARS Coronavirus 2 by RT PCR NEGATIVE NEGATIVE    Comment: (NOTE) SARS-CoV-2 target nucleic acids are NOT DETECTED. The SARS-CoV-2 RNA is generally detectable in upper respiratoy specimens during the acute phase of infection. The lowest concentration of SARS-CoV-2 viral copies this assay can detect is 131 copies/mL. A negative result does not preclude SARS-Cov-2 infection and should not be used as the sole basis for treatment or other patient management decisions. A negative result may occur with  improper specimen collection/handling, submission of specimen other than nasopharyngeal swab, presence of viral mutation(s) within the areas targeted by this assay, and inadequate number of viral copies (<131 copies/mL). A negative result must be combined with clinical observations, patient history, and epidemiological information. The expected result is Negative. Fact Sheet for Patients:  PinkCheek.be Fact Sheet for Healthcare Providers:  GravelBags.it This test is not yet ap proved or cleared by the [place] FDA and  has been authorized for detection and/or diagnosis of SARS-CoV-2 by FDA under an Emergency Use Authorization (EUA). This EUA will remain  in effect (meaning this test can be used) for the duration of the COVID-19 declaration under Section 564(b)(1) of the Act, 21 U.S.C. section 360bbb-3(b)(1), unless the authorization is terminated or revoked sooner.    Influenza A by PCR NEGATIVE NEGATIVE   Influenza B by PCR NEGATIVE NEGATIVE    Comment: (NOTE) The Xpert Xpress SARS-CoV-2/FLU/RSV assay is intended as an aid in  the diagnosis of influenza from Nasopharyngeal swab specimens and  should not be used as a sole basis for treatment. Nasal washings and  aspirates are unacceptable for Xpert Xpress SARS-CoV-2/FLU/RSV   testing. Fact Sheet for Patients: PinkCheek.be Fact Sheet for Healthcare Providers: GravelBags.it This test is not yet approved or cleared by the [place] FDA and  has been authorized for detection and/or diagnosis of SARS-CoV-2 by  FDA under an Emergency Use Authorization (EUA). This EUA will remain  in effect (meaning this test can be used) for the duration of the  Covid-19 declaration under Section 564(b)(1) of the Act, 21  U.S.C. section 360bbb-3(b)(1), unless the authorization is  terminated or revoked. Performed at [facility], [address] [place], [place] [address]   Blood culture (routine x 2)     Status: None (Preliminary result)   Collection Time: 02/10/19  3:00 PM   Specimen: Blood  Result Value Ref Range   Specimen Description LEFT ANTECUBITAL    Special Requests      BOTTLES DRAWN AEROBIC AND ANAEROBIC Blood Culture adequate volume Performed at [facility], [address], [place], [place] [address]    Culture PENDING    Report Status PENDING   Lactic acid, plasma     Status: Abnormal   Collection Time: 02/10/19  3:07 PM  Result Value Ref Range   Lactic Acid, Venous 2.1 (HH) 0.5 - 1.9 mmol/L    Comment: CRITICAL RESULT CALLED TO, READ BACK BY AND VERIFIED WITH: Burman Nieves AT 1600 ON 12.17.20 BY ISLEY,B Performed at [facility], [address], [place], [place] [address]   Comprehensive metabolic panel     Status: Abnormal   Collection Time: 02/10/19  3:08 PM  Result Value Ref Range   Sodium 140 135 - 145 mmol/L   Potassium 4.1 3.5 - 5.1  mmol/L   Chloride 105 98 - 111 mmol/L   CO2 29 22 - 32 mmol/L   Glucose, Bld 61 (L) 70 - 99 mg/dL   BUN 38 (H) 8 - 23 mg/dL   Creatinine, Ser 2.02 (H) 0.61 - 1.24 mg/dL   Calcium 8.0 (L) 8.9 - 10.3 mg/dL   Total Protein 6.2 (L) 6.5 - 8.1 g/dL   Albumin 3.0 (L) 3.5 - 5.0 g/dL   AST 36 15 - 41 U/L   ALT 7 0 - 44 U/L   Alkaline Phosphatase 86 38 - 126 U/L    Total Bilirubin 0.7 0.3 - 1.2 mg/dL   GFR calc non Af Amer 33 (L) >60 mL/min   GFR calc Af Amer 38 (L) >60 mL/min   Anion gap 6 5 - 15    Comment: Performed at [facility], [address] [place], [place] [address]  CBC WITH DIFFERENTIAL     Status: Abnormal   Collection Time: 02/10/19  3:08 PM  Result Value Ref Range   WBC 4.0 4.0 - 10.5 K/uL   RBC 3.10 (L) 4.22 - 5.81 MIL/uL   Hemoglobin 8.5 (L) 13.0 - 17.0 g/dL   HCT 29.3 (L) 39.0 - 52.0 %   MCV 94.5 80.0 - 100.0 fL   MCH 27.4 26.0 - 34.0 pg   MCHC 29.0 (L) 30.0 - 36.0 g/dL   RDW 15.3 11.5 - 15.5 %   Platelets 211 150 - 400 K/uL   nRBC 0.0 0.0 - 0.2 %   Neutrophils Relative % 64 %   Neutro Abs 2.5 1.7 - 7.7 K/uL   Lymphocytes Relative 25 %   Lymphs Abs 1.0 0.7 - 4.0 K/uL   Monocytes Relative 11 %   Monocytes Absolute 0.4 0.1 - 1.0 K/uL   Eosinophils Relative 0 %   Eosinophils Absolute 0.0 0.0 - 0.5 K/uL   Basophils Relative 0 %   Basophils Absolute 0.0 0.0 - 0.1 K/uL   Immature Granulocytes 0 %   Abs Immature Granulocytes 0.01 0.00 - 0.07 K/uL    Comment: Performed at [facility], [address] [place], [place] [address]  APTT     Status: Abnormal   Collection Time: 02/10/19  3:08 PM  Result Value Ref Range   aPTT 66 (H) 24 - 36 seconds    Comment:        IF BASELINE aPTT IS ELEVATED, SUGGEST PATIENT RISK ASSESSMENT BE USED TO DETERMINE APPROPRIATE ANTICOAGULANT THERAPY. Performed at [facility], [address] [place], [place] [address]   Protime-INR     Status: Abnormal   Collection Time: 02/10/19  3:08 PM  Result Value Ref Range   Prothrombin Time 37.7 (H) 11.4 - 15.2 seconds   INR 3.8 (H) 0.8 - 1.2    Comment: (NOTE) INR goal varies based on device and disease states. Performed at [facility], [address], [place], [place] [address]   Blood culture (routine x 2)     Status: None (Preliminary result)   Collection Time: 02/10/19  3:09 PM   Specimen: Blood  Result Value Ref Range   Specimen  Description BLOOD LEFT WRIST    Special Requests      BOTTLES DRAWN AEROBIC AND ANAEROBIC Blood Culture adequate volume Performed at [facility], [address] [place], [place] [address]    Culture PENDING    Report Status PENDING   Lactic acid, plasma     Status: None   Collection Time: 02/10/19  5:16 PM  Result Value Ref Range   Lactic Acid, Venous 1.6 0.5 - 1.9 mmol/L    Comment: Performed at [facility], [address], [place], [place] [address]  POC occult blood, ED Provider will collect     Status: None   Collection Time: 02/10/19  6:48 PM  Result Value Ref Range   Fecal Occult Bld NEGATIVE NEGATIVE    Chemistries  Recent Labs  Lab 02/10/19 1508  NA 140  K 4.1  CL 105  CO2 29  GLUCOSE 61*  BUN 38*  CREATININE 2.02*  CALCIUM 8.0*  AST 36  ALT 7  ALKPHOS 86  BILITOT 0.7   ------------------------------------------------------------------------------------------------------------------  ------------------------------------------------------------------------------------------------------------------ GFR: Estimated Creatinine Clearance: 30.8 mL/min (A) (by C-G formula based on SCr of 2.02 mg/dL (H)). Liver Function Tests: Recent Labs  Lab 02/10/19 1508  AST 36  ALT 7  ALKPHOS 86  BILITOT 0.7  PROT 6.2*  ALBUMIN 3.0*   No results for input(s): LIPASE, AMYLASE in the last 168 hours. No results for input(s): AMMONIA in the last 168 hours. Coagulation Profile: Recent Labs  Lab 02/10/19 1508  INR 3.8*   Cardiac Enzymes: No results for input(s): CKTOTAL, CKMB, CKMBINDEX, TROPONINI in the last 168 hours. BNP (last 3 results) No results for input(s): PROBNP in the last 8760 hours. HbA1C: No results for input(s): HGBA1C in the last 72 hours. CBG: No results for input(s): GLUCAP in the last 168 hours. Lipid Profile: No results for input(s): CHOL, HDL, LDLCALC, TRIG, CHOLHDL, LDLDIRECT in the last 72 hours. Thyroid Function Tests: No results for  input(s): TSH, T4TOTAL, FREET4, T3FREE, THYROIDAB in the last 72 hours. Anemia Panel: No results for input(s): VITAMINB12, FOLATE, FERRITIN, TIBC, IRON, RETICCTPCT in the last 72 hours.  --------------------------------------------------------------------------------------------------------------- Urine analysis:    Component Value Date/Time   COLORURINE STRAW (A) 02/10/2019 1405   APPEARANCEUR CLEAR 02/10/2019 1405   LABSPEC 1.011 02/10/2019 1405   PHURINE 5.0 02/10/2019 1405   GLUCOSEU NEGATIVE 02/10/2019 1405   HGBUR SMALL (A) 02/10/2019 1405   BILIRUBINUR NEGATIVE 02/10/2019 1405   KETONESUR NEGATIVE 02/10/2019 1405   PROTEINUR NEGATIVE 02/10/2019 1405   UROBILINOGEN 0.2 09/12/2011 1650   NITRITE POSITIVE (A) 02/10/2019 1405   LEUKOCYTESUR MODERATE (A) 02/10/2019 1405      Imaging Results:    CT Abdomen Pelvis Wo Contrast  Result Date: 02/10/2019 CLINICAL DATA:  Abdominal distension.  Dementia. EXAM: CT ABDOMEN AND PELVIS WITHOUT CONTRAST TECHNIQUE: Multidetector CT imaging of the abdomen and pelvis was performed following the standard protocol without IV contrast. COMPARISON:  None. FINDINGS: Extremely limited examination due to lack of IV and oral contrast, patient motion and arms being down by his side. Lower chest: A few patchy areas of inflammation or possible infection along with bibasilar atelectasis. No pleural effusions. The heart is within normal limits in size. No pericardial effusion. Hepatobiliary: No gross abnormality involving the liver is identified. The gallbladder appears normal. Pancreas: Very limited examination.  No obvious abnormality. Spleen: Normal size.  No gross abnormality. Adrenals/Urinary Tract: Very limited examination. No gross abnormality. I do not see any definite renal calculi or hydronephrosis. Possible right adrenal gland lesion Stomach/Bowel: Markedly distended: Containing large amount of stool all the way down to the rectum. Findings suggest  chronic constipation and fecal impaction. There also air distended loops of small bowel, likely diffuse ileus. Vascular/Lymphatic: The aorta is normal in caliber. No obvious large mesenteric or retroperitoneal mass. Reproductive: The prostate gland and seminal vesicles are grossly normal. Other: No obvious  pelvic mass or large free pelvic fluid collection. Diffuse body wall edema suggesting anasarca. Musculoskeletal: No significant bony findings. IMPRESSION: 1. Extremely limited CT scan as discussed above. 2. Stool-filled distended colon suggesting chronic constipation, fecal impaction and probable colonic ileus. Small bowel ileus is also suspected. 3. No obvious/gross abnormality involving the solid abdominal organs but examination is limited. 4. Possible right adrenal gland lesion. 5. Patchy inflammatory or infectious findings at the lung bases along with atelectasis. Electronically Signed   By: Marijo Sanes M.D.   On: 02/10/2019 17:11   CT Head Wo Contrast  Result Date: 02/10/2019 CLINICAL DATA:  Encephalopathy. Additional provided: Patient "slumped over" during lunch with speech difficulty. EXAM: CT HEAD WITHOUT CONTRAST TECHNIQUE: Contiguous axial images were obtained from the base of the skull through the vertex without intravenous contrast. COMPARISON:  Head CT 11/23/2018 FINDINGS: Brain: No evidence of acute intracranial hemorrhage. No demarcated cortical infarction. No evidence of intracranial mass. No midline shift or extra-axial fluid collection. Ill-defined hypoattenuation within the cerebral white matter is nonspecific, but consistent with chronic small vessel ischemic disease. Mild generalized parenchymal atrophy. Vascular: No hyperdense vessel. Skull: No calvarial fracture or suspicious osseous lesion. Sinuses/Orbits: Visualized orbits demonstrate no acute abnormality. A fracture deformity of the left nasal bone appears to have been present on prior head CT 10/20/2004, although displacement may  be somewhat increased since that time. Partial opacification of right ethmoid air cells. No significant mastoid effusion. IMPRESSION: No evidence of acute intracranial abnormality. Generalized parenchymal atrophy and chronic small vessel ischemic disease. A fracture deformity of the left nasal bone appears to have been present on prior head CT 10/20/2004, although displacement may be somewhat increased since that time. Clinical correlation is recommended. Partial opacification of right ethmoid air cells. Correlate for acute sinusitis. Electronically Signed   By: Kellie Simmering DO   On: 02/10/2019 17:10   DG Chest Port 1 View  Result Date: 02/10/2019 CLINICAL DATA:  Altered mental status and hypotension EXAM: PORTABLE CHEST 1 VIEW COMPARISON:  December 06, 2018 FINDINGS: There is persistent elevation of the left hemidiaphragm. No edema or consolidation. Heart size and pulmonary vascularity are normal. No adenopathy. Visualized bowel loops in the upper abdomen appear distended. IMPRESSION: No edema or consolidation. Heart size normal. Elevation of left hemidiaphragm again noted. Visualized bowel appear somewhat distended. It may be prudent to correlate with abdominal imaging to further assess in this regard. Electronically Signed   By: Lowella Grip III M.D.   On: 02/10/2019 14:28    My personal review of EKG: Rhythm NSR, Rate 50 /min, QTc 452 ,no Acute ST changes   Assessment & Plan:    Active Problems:   Sepsis ([place])   1. Sepsis 2/2 UTI vs Pneumonia 1. WBC 4.0, T 92.7, BP 71/48  2. BP improved with fluid bolus 3. Vanc, cefepime and flagyl started in ED 4. UA is suspicious for UTI 5. CT abdomen shows possible infectious changes of lungs 6. Continue vanc and cefepime 7. Urine cultures and blood cultures pending 8. Continue warming blanket 9. Monitor on step down unit 10. Trend WBC with CBC in the am 11. Repeat CXR - developing pneumonia may be visible after proper hydration  2. Syncopal  Episode 1. Likely due to severe hypotension with a BP on arrival of 71/48 2. Fluid bolus improved BP 3. CT head shows no acute disease 4. Monitor on tele 5. Consider further workup of syncopal episode if patient has recurrent episode after proper hydration and stable  BP  3. AKI 1. Baselin 1.6 2. Today Cr 2.02 3. Likely pre-renal - volume down in the setting of sepsis 2/2 UTI 4. 3ml/kg given in the ED 5. Continue maintenance fluids 6. Trend with CMP in the am 7. Hold torsemide  4. Lactic Acidosis 1. Improved in ED 2. Likely 2/2 to poor perfusion with BP of 71/48 on arrival 3. Continue to monitor  5. Spura-therapeutic INR 1. On warfarin for DVT 2. Hold warfarin 3. Recheck INR in the am  6. Chronic constipation 1. CT reveals possible ileus 2. Consult gen surg  3. Fleet enema tonight 4. miralax and senna kot to continue tomorrow 5. IV hydration - as volume down status is undoubtedly contributing 6. No nausea or abd pain reported 7. Continue to monitor  7. Acute on chronic anemia 1. FOBT negative 2. No signs of active bleeding 3. Hold warfarin 4. Trend in am with CBC    DVT Prophylaxis-   SCDs and supra-therapeutic INR  AM Labs Ordered, also please review Full Orders  Family Communication: No family at bedside  Code Status: Full code  Admission status: Inpatient: Based on patients clinical presentation and evaluation of above clinical data, I have made determination that patient meets Inpatient criteria at this time.  Time spent in minutes : [place]ille.D

## 2019-02-10 NOTE — ED Triage Notes (Signed)
Pt brought to ED via [facility] for AMS. Per staff at [facility], pt was eating lunch and aboout 1 hour ago slumped over to the right side and they started having more of a difficult time understanding his speech. Glucose per EMS 62, given 2 oral dextrose.

## 2019-02-10 NOTE — ED Notes (Signed)
Date and time results received: 02/10/19 0533 (use smartphrase ".now" to insert current time)  Test: trop Critical Value: 113  Name of Provider Notified: kelly pa  Orders Received? Or Actions Taken?: see chart

## 2019-02-11 ENCOUNTER — Inpatient Hospital Stay (HOSPITAL_COMMUNITY): Payer: Medicare Other

## 2019-02-11 DIAGNOSIS — A419 Sepsis, unspecified organism: Principal | ICD-10-CM

## 2019-02-11 DIAGNOSIS — R6520 Severe sepsis without septic shock: Secondary | ICD-10-CM

## 2019-02-11 DIAGNOSIS — N179 Acute kidney failure, unspecified: Secondary | ICD-10-CM

## 2019-02-11 LAB — COMPREHENSIVE METABOLIC PANEL
ALT: 13 U/L (ref 0–44)
AST: 37 U/L (ref 15–41)
Albumin: 3 g/dL — ABNORMAL LOW (ref 3.5–5.0)
Alkaline Phosphatase: 81 U/L (ref 38–126)
Anion gap: 8 (ref 5–15)
BUN: 34 mg/dL — ABNORMAL HIGH (ref 8–23)
CO2: 30 mmol/L (ref 22–32)
Calcium: 8.4 mg/dL — ABNORMAL LOW (ref 8.9–10.3)
Chloride: 107 mmol/L (ref 98–111)
Creatinine, Ser: 1.74 mg/dL — ABNORMAL HIGH (ref 0.61–1.24)
GFR calc Af Amer: 45 mL/min — ABNORMAL LOW (ref >60)
GFR calc non Af Amer: 39 mL/min — ABNORMAL LOW (ref >60)
Glucose, Bld: 58 mg/dL — ABNORMAL LOW (ref 70–99)
Potassium: 4.7 mmol/L (ref 3.5–5.1)
Sodium: 145 mmol/L (ref 135–145)
Total Bilirubin: 0.7 mg/dL (ref 0.3–1.2)
Total Protein: 6.1 g/dL — ABNORMAL LOW (ref 6.5–8.1)

## 2019-02-11 LAB — GLUCOSE, CAPILLARY
Glucose-Capillary: 113 mg/dL — ABNORMAL HIGH (ref 70–99)
Glucose-Capillary: 56 mg/dL — ABNORMAL LOW (ref 70–99)
Glucose-Capillary: 71 mg/dL (ref 70–99)
Glucose-Capillary: 83 mg/dL (ref 70–99)
Glucose-Capillary: 83 mg/dL (ref 70–99)
Glucose-Capillary: 88 mg/dL (ref 70–99)
Glucose-Capillary: 90 mg/dL (ref 70–99)

## 2019-02-11 LAB — CBC
HCT: 29 % — ABNORMAL LOW (ref 39.0–52.0)
Hemoglobin: 8.2 g/dL — ABNORMAL LOW (ref 13.0–17.0)
MCH: 25.8 pg — ABNORMAL LOW (ref 26.0–34.0)
MCHC: 28.3 g/dL — ABNORMAL LOW (ref 30.0–36.0)
MCV: 91.2 fL (ref 80.0–100.0)
Platelets: 205 10*3/uL (ref 150–400)
RBC: 3.18 MIL/uL — ABNORMAL LOW (ref 4.22–5.81)
RDW: 15 % (ref 11.5–15.5)
WBC: 5.1 10*3/uL (ref 4.0–10.5)
nRBC: 0 % (ref 0.0–0.2)

## 2019-02-11 LAB — CORTISOL-AM, BLOOD: Cortisol - AM: 13.6 ug/dL (ref 6.7–22.6)

## 2019-02-11 LAB — PROTIME-INR
INR: 4.1 (ref 0.8–1.2)
Prothrombin Time: 39.9 seconds — ABNORMAL HIGH (ref 11.4–15.2)

## 2019-02-11 LAB — MRSA PCR SCREENING: MRSA by PCR: NEGATIVE

## 2019-02-11 MED ORDER — POLYETHYLENE GLYCOL 3350 17 G PO PACK
17.0000 g | PACK | Freq: Two times a day (BID) | ORAL | Status: DC
  Administered 2019-02-11 – 2019-02-13 (×5): 17 g via ORAL
  Filled 2019-02-11 (×5): qty 1

## 2019-02-11 MED ORDER — DEXTROSE IN LACTATED RINGERS 5 % IV SOLN: INTRAVENOUS | Status: DC

## 2019-02-11 MED ORDER — SENNOSIDES-DOCUSATE SODIUM 8.6-50 MG PO TABS
2.0000 | ORAL_TABLET | Freq: Two times a day (BID) | ORAL | Status: DC
  Administered 2019-02-11 – 2019-02-13 (×4): 2 via ORAL
  Filled 2019-02-11 (×4): qty 2

## 2019-02-11 MED ORDER — BISACODYL 10 MG RE SUPP
10.0000 mg | Freq: Every evening | RECTAL | Status: DC
  Administered 2019-02-11: 20:00:00 10 mg via RECTAL
  Filled 2019-02-11: qty 1

## 2019-02-11 MED ORDER — DEXTROSE 50 % IV SOLN
INTRAVENOUS | Status: AC
  Administered 2019-02-11: 05:00:00 50 mL via INTRAVENOUS
  Filled 2019-02-11: qty 50

## 2019-02-11 MED ORDER — CHLORHEXIDINE GLUCONATE CLOTH 2 % EX PADS
6.0000 | MEDICATED_PAD | Freq: Every day | CUTANEOUS | Status: DC
  Administered 2019-02-11 – 2019-02-13 (×3): 6 via TOPICAL

## 2019-02-11 NOTE — Progress Notes (Signed)
Patient Demographics:    [name], is a 68 y.o. male, DOB - [birth year], KZS:[id number]  Admit date - 02/10/2019   Admitting Physician Rolla Plate, DO  Outpatient Primary MD for the patient is Rosita Fire, MD  LOS - 1   Chief Complaint  Patient presents with   Altered Mental Status        Subjective:    [name] today has no fevers, no emesis,  No chest pain --  Assessment  & Plan :    Active Problems:   Sepsis [facility])  Brief Summary:-  68 y.o. male, with a history of Parkinson's, cognitive delay, HTN admitted on 02/10/2019 with sepsis with hypotension and syncope   A/p  1)Sepsis ---??? Source, UA without bacteria but with positive leukocytes nitrite and WBC-- -continue cefepime pending urine and blood cultures, -Okay to discontinue vancomycin as MRSA PCR is negative -Blood pressure improving with IV fluids  2) constipation/obstipation--aggressive bowel regimen, discussed with general surgeon Dr. Constance Haw, no acute abdomen  3) history of DVT - patient with DVT by LE venous u/s 11/22/18 right posterior tibial vein with thrombophlebitis right great saphenous vein. Was unable to afford Xarelto and went unprotected for several days.  -V/Q negative recently,  -INR currently supratherapeutic so hold Coumadin  4)AKI----acute kidney injury due to presumed UTI and dehydration creatinine on admission= 2.0 , baseline creatinine = 1.2 (10/14/2018) , creatinine is now= 1.74, renally adjust medications, avoid nephrotoxic agents / dehydration / hypotension -Hold torsemide, continue IV fluids  5) Dementia/depressive disorder-stable,c/nbuspirone, clonazepam, Clozaril, on Aricept  6)Parkinson's -stable, continue Sinemet and Azilect  7) Disposition - - long term residentat the group home -[facility]  Disposition--back to  group home [facility]  Code Status:full  Family Communication: Discussed with [name] [phone] (group home administrator)  Disposition Plan:hh at group home  Consults :na  Discharge Condition: stable  Follow UP--Dr. Legrand Rams within a week  Disposition/Need for in-Hospital Stay- patient unable to be discharged at this time due to --sepsis and soft BP requiring IV antibiotics pending cultures  Code Status : Full  DVT Prophylaxis  :  Coumadin - SCDs   Lab Results  Component Value Date   PLT 205 02/11/2019    Inpatient Medications  Scheduled Meds:  amantadine  100 mg Oral BID   aspirin EC  81 mg Oral Daily   busPIRone  15 mg Oral BID   carbidopa-levodopa  2 tablet Oral QID   Chlorhexidine Gluconate Cloth  6 each Topical Daily   cloZAPine  50 mg Oral QHS   docusate sodium  100 mg Oral BID   donepezil  10 mg Oral Daily   feeding supplement (ENSURE ENLIVE)  237 mL Oral BID   polyethylene glycol  17 g Oral BID   rasagiline  1 mg Oral Daily   senna-docusate  2 tablet Oral QHS   simvastatin  20 mg Oral Daily   tamsulosin  0.4 mg Oral Daily   Continuous Infusions:  ceFEPime (MAXIPIME) IV 2 g (02/11/19 0921)   dextrose 5% lactated ringers 100 mL/hr at 02/11/19 0600   vancomycin 1,000 mg (02/11/19 1515)   PRN Meds:.acetaminophen **OR** acetaminophen, albuterol,  clonazePAM, ondansetron **OR** ondansetron (ZOFRAN) IV, oxyCODONE    Anti-infectives (From admission, onward)   Start     Dose/Rate Route Frequency Ordered Stop   02/11/19 1600  vancomycin (VANCOCIN) IVPB 1000 mg/200 mL premix     1,000 mg 200 mL/hr over 60 Minutes Intravenous Every 24 hours 02/10/19 1629     02/11/19 0300  ceFEPIme (MAXIPIME) 2 g in sodium chloride 0.9 % 100 mL IVPB     2 g 200 mL/hr over 30 Minutes Intravenous Every 12 hours 02/10/19 1629     02/10/19 1430  ceFEPIme (MAXIPIME) 2 g in sodium chloride 0.9 % 100 mL IVPB       2 g 200 mL/hr over 30 Minutes Intravenous  Once 02/10/19 1423 02/10/19 1632   02/10/19 1430  metroNIDAZOLE (FLAGYL) IVPB 500 mg     500 mg 100 mL/hr over 60 Minutes Intravenous  Once 02/10/19 1423 02/10/19 1638   02/10/19 1430  vancomycin (VANCOCIN) IVPB 1000 mg/200 mL premix  Status:  Discontinued     1,000 mg 200 mL/hr over 60 Minutes Intravenous  Once 02/10/19 1423 02/10/19 1427   02/10/19 1430  vancomycin (VANCOREADY) IVPB 1500 mg/300 mL     1,500 mg 150 mL/hr over 120 Minutes Intravenous  Once 02/10/19 1427 02/10/19 1740        Objective:   Vitals:   02/11/19 1300 02/11/19 1400 02/11/19 1500 02/11/19 1600  BP: 102/68 (!) 147/81 132/78 129/76  Pulse: 63     Resp: 14 13 16 14   Temp:    97.6 F (36.4 C)  TempSrc:    Oral  SpO2: 100%   100%  Weight:      Height:        Wt Readings from Last 3 Encounters:  02/11/19 63 kg  11/28/18 85.7 kg  11/23/18 85.7 kg     Intake/Output Summary (Last 24 hours) at 02/11/2019 1737 Last data filed at 02/11/2019 1615 Gross per 24 hour  Intake 2259.42 ml  Output 1350 ml  Net 909.42 ml     Physical Exam  Gen:- Awake Alert, no acute distress , resting comfortably HEENT:- .AT, No sclera icterus Neck-Supple Neck,No JVD,.  Lungs-  CTAB , good air movement bilaterally  CV- S1, S2 normal, regular Abd-  +ve B.Sounds, Abd Soft, No tenderness,    Extremity/Skin:- No  edema,   good pulses Psych-affect is appropriate, intermittent episodes of confusion and disorientation Neuro-generalized weakness, no new focal deficits, occasional tremors    Data Review:   Micro Results Recent Results (from the past 240 hour(s))  Respiratory Panel by RT PCR (Flu A&B, Covid) - Nasopharyngeal Swab     Status: None   Collection Time: 02/10/19  2:44 PM   Specimen: Nasopharyngeal Swab  Result Value Ref Range Status   SARS Coronavirus 2 by RT PCR NEGATIVE NEGATIVE Final    Comment: (NOTE) SARS-CoV-2 target nucleic acids are NOT DETECTED. The  SARS-CoV-2 RNA is generally detectable in upper respiratoy specimens during the acute phase of infection. The lowest concentration of SARS-CoV-2 viral copies this assay can detect is 131 copies/mL. A negative result does not preclude SARS-Cov-2 infection and should not be used as the sole basis for treatment or other patient management decisions. A negative result may occur with  improper specimen collection/handling, submission of specimen other than nasopharyngeal swab, presence of viral mutation(s) within the areas targeted by this assay, and inadequate number of viral copies (<131 copies/mL). A negative result must be combined with clinical observations,  patient history, and epidemiological information. The expected result is Negative. Fact Sheet for Patients:  PinkCheek.be Fact Sheet for Healthcare Providers:  GravelBags.it This test is not yet ap proved or cleared by the [place] FDA and  has been authorized for detection and/or diagnosis of SARS-CoV-2 by FDA under an Emergency Use Authorization (EUA). This EUA will remain  in effect (meaning this test can be used) for the duration of the COVID-19 declaration under Section 564(b)(1) of the Act, 21 U.S.C. section 360bbb-3(b)(1), unless the authorization is terminated or revoked sooner.    Influenza A by PCR NEGATIVE NEGATIVE Final   Influenza B by PCR NEGATIVE NEGATIVE Final    Comment: (NOTE) The Xpert Xpress SARS-CoV-2/FLU/RSV assay is intended as an aid in  the diagnosis of influenza from Nasopharyngeal swab specimens and  should not be used as a sole basis for treatment. Nasal washings and  aspirates are unacceptable for Xpert Xpress SARS-CoV-2/FLU/RSV  testing. Fact Sheet for Patients: PinkCheek.be Fact Sheet for Healthcare Providers: GravelBags.it This test is not yet approved or cleared by the [place] FDA and  has been authorized for detection and/or diagnosis of SARS-CoV-2 by  FDA under an Emergency Use Authorization (EUA). This EUA will remain  in effect (meaning this test can be used) for the duration of the  Covid-19 declaration under Section 564(b)(1) of the Act, 21  U.S.C. section 360bbb-3(b)(1), unless the authorization is  terminated or revoked. Performed at [facility], [address] [place], [place] [address]   Blood culture (routine x 2)     Status: None (Preliminary result)   Collection Time: 02/10/19  3:00 PM   Specimen: Left Antecubital; Blood  Result Value Ref Range Status   Specimen Description LEFT ANTECUBITAL  Final   Special Requests   Final    BOTTLES DRAWN AEROBIC AND ANAEROBIC Blood Culture adequate volume   Culture   Final    NO GROWTH < 24 HOURS Performed at [facility], [address], [place], [place] [address]    Report Status PENDING  Incomplete  Blood culture (routine x 2)     Status: None (Preliminary result)   Collection Time: 02/10/19  3:09 PM   Specimen: BLOOD LEFT WRIST  Result Value Ref Range Status   Specimen Description BLOOD LEFT WRIST  Final   Special Requests   Final    BOTTLES DRAWN AEROBIC AND ANAEROBIC Blood Culture adequate volume   Culture   Final    NO GROWTH < 24 HOURS Performed at [facility], [address], [place], [place] [address]    Report Status PENDING  Incomplete  MRSA PCR Screening     Status: None   Collection Time: 02/11/19 12:09 AM   Specimen: Nasal Mucosa; Nasopharyngeal  Result Value Ref Range Status   MRSA by PCR NEGATIVE NEGATIVE Final    Comment:        The GeneXpert MRSA Assay (FDA approved for NASAL specimens only), is one component of a comprehensive MRSA colonization surveillance program. It is not intended to diagnose MRSA infection nor to guide or monitor treatment for MRSA infections. Performed at [facility], [address] [place], [place] [address]     Radiology Reports CT  Abdomen Pelvis Wo Contrast  Result Date: 02/10/2019 CLINICAL DATA:  Abdominal distension.  Dementia. EXAM: CT ABDOMEN AND PELVIS WITHOUT CONTRAST TECHNIQUE: Multidetector CT imaging of the abdomen and pelvis was performed following the standard protocol without IV contrast. COMPARISON:  None. FINDINGS: Extremely limited examination  due to lack of IV and oral contrast, patient motion and arms being down by his side. Lower chest: A few patchy areas of inflammation or possible infection along with bibasilar atelectasis. No pleural effusions. The heart is within normal limits in size. No pericardial effusion. Hepatobiliary: No gross abnormality involving the liver is identified. The gallbladder appears normal. Pancreas: Very limited examination.  No obvious abnormality. Spleen: Normal size.  No gross abnormality. Adrenals/Urinary Tract: Very limited examination. No gross abnormality. I do not see any definite renal calculi or hydronephrosis. Possible right adrenal gland lesion Stomach/Bowel: Markedly distended: Containing large amount of stool all the way down to the rectum. Findings suggest chronic constipation and fecal impaction. There also air distended loops of small bowel, likely diffuse ileus. Vascular/Lymphatic: The aorta is normal in caliber. No obvious large mesenteric or retroperitoneal mass. Reproductive: The prostate gland and seminal vesicles are grossly normal. Other: No obvious pelvic mass or large free pelvic fluid collection. Diffuse body wall edema suggesting anasarca. Musculoskeletal: No significant bony findings. IMPRESSION: 1. Extremely limited CT scan as discussed above. 2. Stool-filled distended colon suggesting chronic constipation, fecal impaction and probable colonic ileus. Small bowel ileus is also suspected. 3. No obvious/gross abnormality involving the solid abdominal organs but examination is limited. 4. Possible right adrenal gland lesion. 5. Patchy inflammatory or infectious findings  at the lung bases along with atelectasis. Electronically Signed   By: Marijo Sanes M.D.   On: 02/10/2019 17:11   CT Head Wo Contrast  Result Date: 02/10/2019 CLINICAL DATA:  Encephalopathy. Additional provided: Patient "slumped over" during lunch with speech difficulty. EXAM: CT HEAD WITHOUT CONTRAST TECHNIQUE: Contiguous axial images were obtained from the base of the skull through the vertex without intravenous contrast. COMPARISON:  Head CT 11/23/2018 FINDINGS: Brain: No evidence of acute intracranial hemorrhage. No demarcated cortical infarction. No evidence of intracranial mass. No midline shift or extra-axial fluid collection. Ill-defined hypoattenuation within the cerebral white matter is nonspecific, but consistent with chronic small vessel ischemic disease. Mild generalized parenchymal atrophy. Vascular: No hyperdense vessel. Skull: No calvarial fracture or suspicious osseous lesion. Sinuses/Orbits: Visualized orbits demonstrate no acute abnormality. A fracture deformity of the left nasal bone appears to have been present on prior head CT 10/20/2004, although displacement may be somewhat increased since that time. Partial opacification of right ethmoid air cells. No significant mastoid effusion. IMPRESSION: No evidence of acute intracranial abnormality. Generalized parenchymal atrophy and chronic small vessel ischemic disease. A fracture deformity of the left nasal bone appears to have been present on prior head CT 10/20/2004, although displacement may be somewhat increased since that time. Clinical correlation is recommended. Partial opacification of right ethmoid air cells. Correlate for acute sinusitis. Electronically Signed   By: Kellie Simmering DO   On: 02/10/2019 17:10   DG Chest Port 1 View  Result Date: 02/11/2019 CLINICAL DATA:  Sepsis. EXAM: PORTABLE CHEST 1 VIEW COMPARISON:  February 10, 2019. FINDINGS: The heart size and mediastinal contours are within normal limits. Both lungs are  clear. The visualized skeletal structures are unremarkable. IMPRESSION: No active disease. Electronically Signed   By: Marijo Conception M.D.   On: 02/11/2019 11:12   DG Chest Port 1 View  Result Date: 02/10/2019 CLINICAL DATA:  Altered mental status and hypotension EXAM: PORTABLE CHEST 1 VIEW COMPARISON:  December 06, 2018 FINDINGS: There is persistent elevation of the left hemidiaphragm. No edema or consolidation. Heart size and pulmonary vascularity are normal. No adenopathy. Visualized bowel loops in the upper  abdomen appear distended. IMPRESSION: No edema or consolidation. Heart size normal. Elevation of left hemidiaphragm again noted. Visualized bowel appear somewhat distended. It may be prudent to correlate with abdominal imaging to further assess in this regard. Electronically Signed   By: Lowella Grip III M.D.   On: 02/10/2019 14:28     CBC Recent Labs  Lab 02/10/19 1508 02/11/19 0405  WBC 4.0 5.1  HGB 8.5* 8.2*  HCT 29.3* 29.0*  PLT 211 205  MCV 94.5 91.2  MCH 27.4 25.8*  MCHC 29.0* 28.3*  RDW 15.3 15.0  LYMPHSABS 1.0  --   MONOABS 0.4  --   EOSABS 0.0  --   BASOSABS 0.0  --     Chemistries  Recent Labs  Lab 02/10/19 1508 02/11/19 0405  NA 140 145  K 4.1 4.7  CL 105 107  CO2 29 30  GLUCOSE 61* 58*  BUN 38* 34*  CREATININE 2.02* 1.74*  CALCIUM 8.0* 8.4*  AST 36 37  ALT 7 13  ALKPHOS 86 81  BILITOT 0.7 0.7   ------------------------------------------------------------------------------------------------------------------ No results for input(s): CHOL, HDL, LDLCALC, TRIG, CHOLHDL, LDLDIRECT in the last 72 hours.  No results found for: HGBA1C ------------------------------------------------------------------------------------------------------------------ No results for input(s): TSH, T4TOTAL, T3FREE, THYROIDAB in the last 72 hours.  Invalid input(s):  FREET3 ------------------------------------------------------------------------------------------------------------------ No results for input(s): VITAMINB12, FOLATE, FERRITIN, TIBC, IRON, RETICCTPCT in the last 72 hours.  Coagulation profile Recent Labs  Lab 02/10/19 1508 02/11/19 0405  INR 3.8* 4.1*    No results for input(s): DDIMER in the last 72 hours.  Cardiac Enzymes No results for input(s): CKMB, TROPONINI, MYOGLOBIN in the last 168 hours.  Invalid input(s): CK ------------------------------------------------------------------------------------------------------------------    Component Value Date/Time   BNP 14.0 11/22/2018 1427     Terressa Evola M.D on 02/11/2019 at 5:37 PM  Go to [email or web address] - for contact info  [facility] - Office  [phone]

## 2019-02-11 NOTE — Progress Notes (Signed)
[facility]  Surgery consult placed by admitting hospitialist for constipation/ CT findings of small bowel ileus. CT reviewed, agree that constipation and likely has slow motility/ dysmotility, do not think has an ileus but more of back up from constipation. RN has reported a BM. Recommend bowel regimen, enemas if needed, and no surgical intervention indicated. Abdomen soft and no pain reported by patient.   No official consult performed.  Curlene Labrum, MD [facility] [address] [place], [place] [address] [phone]/ [phone] (office)

## 2019-02-11 NOTE — Progress Notes (Signed)
ANTICOAGULATION CONSULT NOTE - Initial Consult  Pharmacy Consult for warfarin Indication: DVT  Patient Measurements: Height: 6' (182.9 cm) Weight: 138 lb 14.2 oz (63 kg) IBW/kg (Calculated) : 77.6   Vital Signs: Temp: 97.6 F (36.4 C) (12/18 1600) Temp Source: Oral (12/18 1600) BP: 97/54 (12/18 1819) Pulse Rate: 63 (12/18 1300)  Labs: Recent Labs    02/10/19 1508 02/11/19 0405  HGB 8.5* 8.2*  HCT 29.3* 29.0*  PLT 211 205  APTT 66*  --   LABPROT 37.7* 39.9*  INR 3.8* 4.1*  CREATININE 2.02* 1.74*    Estimated Creatinine Clearance: 35.7 mL/min (A) (by C-G formula based on SCr of 1.74 mg/dL (H)).   Medical History: Past Medical History:  Diagnosis Date  . Cellulitis   . Dementia ([place])   . Edema   . Hypertension   . Hypoglycemia   . Mental retardation   . Parkinson's disease ([place])   . Psychosis ([place])   . Renal insufficiency     Assessment: 68 yo male admitted with possible sepsis. He has a recent history of a DVT from September of this year. He was originally started on Xarelto but stopped taking this - it is unclear when this was stopped. Most recently, he has been taking warfarin. His MAR reports last dose of warfarin was given on 12/16. Currently, his INR is elevated at 4.1 with no associated bleeding.    Goal of Therapy:  INR 2-3 Monitor platelets by anticoagulation protocol: Yes    Plan:  -Hold warfarin -Trend INR  Harvel Quale 02/11/2019,6:26 PM

## 2019-02-11 NOTE — TOC Initial Note (Signed)
Transition of Care [facility]) - Initial/Assessment Note    Patient Details  Name: [name] MRN: [id number] Date of Birth: Jul 07, 1950  [facility] CM/SW Contact:    Shade Flood, LCSW Phone Number: 02/11/2019, 12:40 PM  Clinical Narrative:                  Pt admitted from [facility]. Pt was admitted here back in October and was discharged home on Coumadin and [facility] for RN and PT. He is normally independent in ADLs. Attempted to call [name] to update but could only leave a voicemail requesting return call.  Per MD pt will remain hospitalized through the weekend. Anticipating pt will to the [facility] at dc. Will follow up and further assist with dc planning as pt's stay progresses.  Expected Discharge Plan: Group Home Barriers to Discharge: Continued Medical Work up   Patient Goals and CMS Choice        Expected Discharge Plan and Services Expected Discharge Plan: Group Home In-house Referral: Clinical Social Work     Living arrangements for the past 2 months: ([place])                                      Prior Living Arrangements/Services Living arrangements for the past 2 months: ([place]) Lives with:: Facility Resident Patient language and need for interpreter reviewed:: Yes Do you feel safe going back to the place where you live?: Yes      Need for Family Participation in Patient Care: No (Comment) Care giver support system in place?: Yes (comment)   Criminal Activity/Legal Involvement Pertinent to Current Situation/Hospitalization: No - Comment as needed  Activities of Daily Living   ADL Screening (condition at time of admission) Patient's cognitive ability adequate to safely complete daily activities?: No Is the patient deaf or have difficulty hearing?: No Does the patient have difficulty seeing, even when wearing glasses/contacts?: No Does the patient have difficulty concentrating,  remembering, or making decisions?: Yes Patient able to express need for assistance with ADLs?: No Does the patient have difficulty dressing or bathing?: Yes Independently performs ADLs?: No Communication: Dependent Is this a change from baseline?: Change from baseline, expected to last >3 days Dressing (OT): Dependent Is this a change from baseline?: Pre-admission baseline Grooming: Dependent Is this a change from baseline?: Pre-admission baseline Feeding: Needs assistance Is this a change from baseline?: Pre-admission baseline Bathing: Needs assistance Is this a change from baseline?: Change from baseline, expected to last >3 days Toileting: Needs assistance Is this a change from baseline?: Change from baseline, expected to last >3days In/Out Bed: Needs assistance Is this a change from baseline?: Change from baseline, expected to last >3 days Walks in Home: Needs assistance Is this a change from baseline?: Change from baseline, expected to last >3 days Does the patient have difficulty walking or climbing stairs?: Yes Weakness of Legs: Both Weakness of Arms/Hands: Both  Permission Sought/Granted                  Emotional Assessment Appearance:: Appears stated age     Orientation: : Oriented to Self, Oriented to Place Alcohol / Substance Use: Not Applicable Psych Involvement: No (comment)  Admission diagnosis:  Hypoglycemia [E16.2] Acute cystitis without hematuria [N30.00] Sepsis ([place]) [A41.9] Hypotension, unspecified hypotension type [I95.9] Constipation, unspecified constipation type [K59.00] Chronic kidney disease, unspecified CKD  stage [N18.9] Sepsis, due to unspecified organism, unspecified whether acute organ dysfunction present [facility]) [A41.9] Patient Active Problem List   Diagnosis Date Noted  . Sepsis ([place]) 02/10/2019  . Sepsis secondary to UTI ([place]) 11/28/2018  . DVT (deep venous thrombosis) ([place]) 11/28/2018  . AKI (acute kidney injury) ([place]) 11/28/2018  .  Cellulitis 10/13/2018  . Schizophrenia ([place]) 10/13/2018  . Parkinson disease ([place]) 10/13/2018  . Dementia ([place]) 10/13/2018  . Encounter for screening colonoscopy 01/30/2015   PCP:  Rosita Fire, MD Pharmacy:   [facility], [address] [place] [address] Phone: [phone] Fax: [phone]     Social Determinants of Health (SDOH) Interventions    Readmission Risk Interventions Readmission Risk Prevention Plan 02/11/2019  Transportation Screening Complete  Medication Review (RN Care Manager) Complete  Palliative Care Screening Not Applicable  Some recent data might be hidden

## 2019-02-12 LAB — URINE CULTURE: Culture: NO GROWTH

## 2019-02-12 LAB — BASIC METABOLIC PANEL
Anion gap: <3 — ABNORMAL LOW (ref 5–15)
BUN: 25 mg/dL — ABNORMAL HIGH (ref 8–23)
CO2: 30 mmol/L (ref 22–32)
Calcium: 8.5 mg/dL — ABNORMAL LOW (ref 8.9–10.3)
Chloride: 111 mmol/L (ref 98–111)
Creatinine, Ser: 1.59 mg/dL — ABNORMAL HIGH (ref 0.61–1.24)
GFR calc Af Amer: 51 mL/min — ABNORMAL LOW (ref >60)
GFR calc non Af Amer: 44 mL/min — ABNORMAL LOW (ref >60)
Glucose, Bld: 73 mg/dL (ref 70–99)
Potassium: 4.6 mmol/L (ref 3.5–5.1)
Sodium: 143 mmol/L (ref 135–145)

## 2019-02-12 LAB — CBC
HCT: 27.5 % — ABNORMAL LOW (ref 39.0–52.0)
Hemoglobin: 8 g/dL — ABNORMAL LOW (ref 13.0–17.0)
MCH: 26.8 pg (ref 26.0–34.0)
MCHC: 29.1 g/dL — ABNORMAL LOW (ref 30.0–36.0)
MCV: 92 fL (ref 80.0–100.0)
Platelets: 193 10*3/uL (ref 150–400)
RBC: 2.99 MIL/uL — ABNORMAL LOW (ref 4.22–5.81)
RDW: 15.5 % (ref 11.5–15.5)
WBC: 5.4 10*3/uL (ref 4.0–10.5)
nRBC: 0 % (ref 0.0–0.2)

## 2019-02-12 LAB — GLUCOSE, CAPILLARY
Glucose-Capillary: 66 mg/dL — ABNORMAL LOW (ref 70–99)
Glucose-Capillary: 70 mg/dL (ref 70–99)
Glucose-Capillary: 90 mg/dL (ref 70–99)
Glucose-Capillary: 77 mg/dL (ref 70–99)
Glucose-Capillary: 79 mg/dL (ref 70–99)
Glucose-Capillary: 71 mg/dL (ref 70–99)
Glucose-Capillary: 142 mg/dL — ABNORMAL HIGH (ref 70–99)

## 2019-02-12 LAB — PROTIME-INR
INR: 3.1 — ABNORMAL HIGH (ref 0.8–1.2)
Prothrombin Time: 31.6 seconds — ABNORMAL HIGH (ref 11.4–15.2)

## 2019-02-12 MED ORDER — WARFARIN SODIUM 1 MG PO TABS
1.0000 mg | ORAL_TABLET | Freq: Once | ORAL | Status: AC
  Administered 2019-02-12: 18:00:00 1 mg via ORAL
  Filled 2019-02-12: qty 1

## 2019-02-12 MED ORDER — WARFARIN - PHARMACIST DOSING INPATIENT: Freq: Every day | Status: DC

## 2019-02-12 NOTE — Progress Notes (Signed)
ANTICOAGULATION CONSULT NOTE -  Pharmacy Consult for warfarin Indication: DVT  Patient Measurements: Height: 6' (182.9 cm) Weight: 145 lb 15.1 oz (66.2 kg) IBW/kg (Calculated) : 77.6   Vital Signs: Temp: 98.3 F (36.8 C) (12/19 1141) Temp Source: Oral (12/19 1141) BP: 111/85 (12/19 1100) Pulse Rate: 77 (12/19 1141)  Labs: Recent Labs    02/10/19 1508 02/11/19 0405 02/12/19 0616  HGB 8.5* 8.2* 8.0*  HCT 29.3* 29.0* 27.5*  PLT 211 205 193  APTT 66*  --   --   LABPROT 37.7* 39.9* 31.6*  INR 3.8* 4.1* 3.1*  CREATININE 2.02* 1.74* 1.59*    Estimated Creatinine Clearance: 41.1 mL/min (A) (by C-G formula based on SCr of 1.59 mg/dL (H)).   Medical History: Past Medical History:  Diagnosis Date  . Cellulitis   . Dementia ([place])   . Edema   . Hypertension   . Hypoglycemia   . Mental retardation   . Parkinson's disease ([place])   . Psychosis ([place])   . Renal insufficiency     Assessment: 68 yo male admitted with possible sepsis. He has a recent history of a DVT from September of this year. He was originally started on Xarelto but stopped taking this - it is unclear when this was stopped. Most recently, he has been taking warfarin. His MAR reports last dose of warfarin was given on 12/16.  Home dose listed as 2.5 mg daily.  Currently, his INR is elevated at 3.1 with no associated bleeding.    Goal of Therapy:  INR 2-3 Monitor platelets by anticoagulation protocol: Yes    Plan:  INR will likely be below 3 this evening so will restart today at lower dose. Warfarin 1 mg x 1 dose. Monitor daily INR and s/s of bleeding.  Margot Ables, PharmD Clinical Pharmacist 02/12/2019 12:02 PM

## 2019-02-12 NOTE — Progress Notes (Signed)
Patient Demographics:    [name], is a 68 y.o. male, DOB - Sep 19, 1950, [id number]  Admit date - 02/10/2019   Admitting Physician Rolla Plate, DO  Outpatient Primary MD for the patient is Rosita Fire, MD  LOS - 2   Chief Complaint  Patient presents with  . Altered Mental Status        Subjective:    [name] today has no fevers, no emesis,  No chest pain --had couple BMs -Oral intake is better -Had episodes of transient hypotension and hypoglycemia overnight  Assessment  & Plan :    Principal Problem:   Sepsis [facility])  Brief Summary:-  68 y.o. male, with a history of Parkinson's, cognitive delay, HTN admitted on 02/10/2019 with sepsis with hypotension and syncope   A/p  1)Sepsis ---??? Source, UA without bacteria but with positive leukocytes nitrite and WBC-- -continue cefepime pending urine and blood cultures, -Okay to discontinue vancomycin as MRSA PCR is negative -Blood pressure improving with IV fluids -Blood and urine cultures NGTD  2) constipation/obstipation--patient having BMs with aggressive bowel regimen, discussed with general surgeon Dr. Constance Haw, no acute abdomen -No evidence of bowel obstruction  3) history of DVT - patient with DVT by LE venous u/s 11/22/18 right posterior tibial vein with thrombophlebitis right great saphenous vein. Was unable to afford Xarelto and went unprotected for several days.  -V/Q negative recently,  -INR down to 3.1 from 4.1 management per pharmacist  4)AKI----acute kidney injury due to presumed UTI and dehydration creatinine on admission= 2.0 , baseline creatinine = 1.2 (10/14/2018) , creatinine is now= 1.59, renally adjust medications, avoid nephrotoxic agents / dehydration / hypotension -Hold torsemide, continue IV fluids  5)Dementia/Depressive disorder- stable,c/nbuspirone, clonazepam,  Clozaril, on Aricept  6)Parkinson's -stable, continue Sinemet and Azilect  7) Disposition - - long term residentat the group home -[facility] RN and [place] PT [facility]  8)Hypoglycemia--- transient episodes of hypoglycemia overnight improved now on 02/12/2019 with improving oral intake, continue IV dextrose infusion for now  Disposition--back to group home [place] RN and [place] PT with [facility]  Code Status:full  Family Communication: Discussed with [name] [phone] (group home administrator)  Disposition Plan:hh at group home  Consults :na  Discharge Condition: stable  Follow UP--Dr. Legrand Rams within a week  Disposition/Need for in-Hospital Stay- patient unable to be discharged at this time due to --sepsis and soft BP requiring IV antibiotics pending cultures  Code Status : Full  DVT Prophylaxis  :  Coumadin - SCDs   Lab Results  Component Value Date   PLT 193 02/12/2019    Inpatient Medications  Scheduled Meds: . amantadine  100 mg Oral BID  . aspirin EC  81 mg Oral Daily  . bisacodyl  10 mg Rectal QPM  . busPIRone  15 mg Oral BID  . carbidopa-levodopa  2 tablet Oral QID  . Chlorhexidine Gluconate Cloth  6 each Topical Daily  . cloZAPine  50 mg Oral QHS  . donepezil  10 mg Oral Daily  . feeding supplement (ENSURE ENLIVE)  237 mL Oral BID  . polyethylene glycol  17 g Oral BID  . rasagiline  1 mg Oral Daily  . senna-docusate  2 tablet Oral BID  .  simvastatin  20 mg Oral Daily  . tamsulosin  0.4 mg Oral Daily  . warfarin  1 mg Oral Once  . Warfarin - Pharmacist Dosing Inpatient   Does not apply q1800   Continuous Infusions: . ceFEPime (MAXIPIME) IV 2 g (02/12/19 1000)  . dextrose 5% lactated ringers 100 mL/hr at 02/12/19 1444   PRN Meds:.acetaminophen **OR** acetaminophen, albuterol, clonazePAM, ondansetron **OR** ondansetron (ZOFRAN) IV, oxyCODONE    Anti-infectives (From admission, onward)   Start     Dose/Rate  Route Frequency Ordered Stop   02/11/19 1600  vancomycin (VANCOCIN) IVPB 1000 mg/200 mL premix  Status:  Discontinued     1,000 mg 200 mL/hr over 60 Minutes Intravenous Every 24 hours 02/10/19 1629 02/11/19 1807   02/11/19 0300  ceFEPIme (MAXIPIME) 2 g in sodium chloride 0.9 % 100 mL IVPB     2 g 200 mL/hr over 30 Minutes Intravenous Every 12 hours 02/10/19 1629     02/10/19 1430  ceFEPIme (MAXIPIME) 2 g in sodium chloride 0.9 % 100 mL IVPB     2 g 200 mL/hr over 30 Minutes Intravenous  Once 02/10/19 1423 02/10/19 1632   02/10/19 1430  metroNIDAZOLE (FLAGYL) IVPB 500 mg     500 mg 100 mL/hr over 60 Minutes Intravenous  Once 02/10/19 1423 02/10/19 1638   02/10/19 1430  vancomycin (VANCOCIN) IVPB 1000 mg/200 mL premix  Status:  Discontinued     1,000 mg 200 mL/hr over 60 Minutes Intravenous  Once 02/10/19 1423 02/10/19 1427   02/10/19 1430  vancomycin (VANCOREADY) IVPB 1500 mg/300 mL     1,500 mg 150 mL/hr over 120 Minutes Intravenous  Once 02/10/19 1427 02/10/19 1740        Objective:   Vitals:   02/12/19 1300 02/12/19 1400 02/12/19 1500 02/12/19 1600  BP: 126/78 (!) 99/58 105/68   Pulse: 68 68 62   Resp: 13 17 16    Temp:    97.6 F (36.4 C)  TempSrc:    Axillary  SpO2: 100% 100% 100%   Weight:      Height:        Wt Readings from Last 3 Encounters:  02/12/19 66.2 kg  11/28/18 85.7 kg  11/23/18 85.7 kg    Intake/Output Summary (Last 24 hours) at 02/12/2019 1648 Last data filed at 02/12/2019 1500 Gross per 24 hour  Intake 3724.39 ml  Output 1000 ml  Net 2724.39 ml    Physical Exam Gen:- Awake Alert, no acute distress , resting comfortably HEENT:- [place].AT, No sclera icterus Neck-Supple Neck,No JVD,.  Lungs-  CTAB , good air movement bilaterally  CV- S1, S2 normal, regular Abd-  +ve B.Sounds, Abd Soft, No tenderness,    Extremity/Skin:- No  edema,   good pulses Psych-affect is appropriate, intermittent episodes of confusion and disorientation Neuro-generalized  weakness, no new focal deficits, occasional tremors    Data Review:   Micro Results Recent Results (from the past 240 hour(s))  Urine culture     Status: None   Collection Time: 02/10/19  2:05 PM   Specimen: In/Out Cath Urine  Result Value Ref Range Status   Specimen Description   Final    IN/OUT CATH URINE Performed at [facility], [address] [place], [place] [address]    Special Requests   Final    NONE Performed at [facility], [address], [place], [place] [address]    Culture   Final    NO GROWTH Performed at [facility],  [address], [place], [place] [address]    Report Status 02/12/2019 FINAL  Final  Respiratory Panel by RT PCR (Flu A&B, Covid) - Nasopharyngeal Swab     Status: None   Collection Time: 02/10/19  2:44 PM   Specimen: Nasopharyngeal Swab  Result Value Ref Range Status   SARS Coronavirus 2 by RT PCR NEGATIVE NEGATIVE Final    Comment: (NOTE) SARS-CoV-2 target nucleic acids are NOT DETECTED. The SARS-CoV-2 RNA is generally detectable in upper respiratoy specimens during the acute phase of infection. The lowest concentration of SARS-CoV-2 viral copies this assay can detect is 131 copies/mL. A negative result does not preclude SARS-Cov-2 infection and should not be used as the sole basis for treatment or other patient management decisions. A negative result may occur with  improper specimen collection/handling, submission of specimen other than nasopharyngeal swab, presence of viral mutation(s) within the areas targeted by this assay, and inadequate number of viral copies (<131 copies/mL). A negative result must be combined with clinical observations, patient history, and epidemiological information. The expected result is Negative. Fact Sheet for Patients:  PinkCheek.be Fact Sheet for Healthcare Providers:  GravelBags.it This test is not yet ap proved or cleared by the [place] FDA and  has been authorized for detection and/or diagnosis of SARS-CoV-2 by FDA under an Emergency Use Authorization (EUA). This EUA will remain  in effect (meaning this test can be used) for the duration of the COVID-19 declaration under Section 564(b)(1) of the Act, 21 U.S.C. section 360bbb-3(b)(1), unless the authorization is terminated or revoked sooner.    Influenza A by PCR NEGATIVE NEGATIVE Final   Influenza B by PCR NEGATIVE NEGATIVE Final    Comment: (NOTE) The Xpert Xpress SARS-CoV-2/FLU/RSV assay is intended as an aid in  the diagnosis of influenza from Nasopharyngeal swab specimens and  should not be used as a sole basis for treatment. Nasal washings and  aspirates are unacceptable for Xpert Xpress SARS-CoV-2/FLU/RSV  testing. Fact Sheet for Patients: PinkCheek.be Fact Sheet for Healthcare Providers: GravelBags.it This test is not yet approved or cleared by the [place] FDA and  has been authorized for detection and/or diagnosis of SARS-CoV-2 by  FDA under an Emergency Use Authorization (EUA). This EUA will remain  in effect (meaning this test can be used) for the duration of the  Covid-19 declaration under Section 564(b)(1) of the Act, 21  U.S.C. section 360bbb-3(b)(1), unless the authorization is  terminated or revoked. Performed at [facility], [address], [place], [place] [address]   Blood culture (routine x 2)     Status: None (Preliminary result)   Collection Time: 02/10/19  3:00 PM   Specimen: Left Antecubital; Blood  Result Value Ref Range Status   Specimen Description LEFT ANTECUBITAL  Final   Special Requests   Final    BOTTLES DRAWN AEROBIC AND ANAEROBIC Blood Culture adequate volume   Culture   Final    NO GROWTH 2 DAYS Performed at [facility], [address] [place], [place] [address]    Report Status PENDING  Incomplete  Blood culture (routine x 2)     Status: None  (Preliminary result)   Collection Time: 02/10/19  3:09 PM   Specimen: BLOOD LEFT WRIST  Result Value Ref Range Status   Specimen Description BLOOD LEFT WRIST  Final   Special Requests   Final    BOTTLES DRAWN AEROBIC AND ANAEROBIC Blood Culture adequate volume   Culture   Final  NO GROWTH 2 DAYS Performed at [facility], [address] [place], [place] [address]    Report Status PENDING  Incomplete  MRSA PCR Screening     Status: None   Collection Time: 02/11/19 12:09 AM   Specimen: Nasal Mucosa; Nasopharyngeal  Result Value Ref Range Status   MRSA by PCR NEGATIVE NEGATIVE Final    Comment:        The GeneXpert MRSA Assay (FDA approved for NASAL specimens only), is one component of a comprehensive MRSA colonization surveillance program. It is not intended to diagnose MRSA infection nor to guide or monitor treatment for MRSA infections. Performed at [facility], [address] [place], [place] [address]    Radiology Reports CT Abdomen Pelvis Wo Contrast  Result Date: 02/10/2019 CLINICAL DATA:  Abdominal distension.  Dementia. EXAM: CT ABDOMEN AND PELVIS WITHOUT CONTRAST TECHNIQUE: Multidetector CT imaging of the abdomen and pelvis was performed following the standard protocol without IV contrast. COMPARISON:  None. FINDINGS: Extremely limited examination due to lack of IV and oral contrast, patient motion and arms being down by his side. Lower chest: A few patchy areas of inflammation or possible infection along with bibasilar atelectasis. No pleural effusions. The heart is within normal limits in size. No pericardial effusion. Hepatobiliary: No gross abnormality involving the liver is identified. The gallbladder appears normal. Pancreas: Very limited examination.  No obvious abnormality. Spleen: Normal size.  No gross abnormality. Adrenals/Urinary Tract: Very limited examination. No gross abnormality. I do not see any definite renal calculi or hydronephrosis. Possible right  adrenal gland lesion Stomach/Bowel: Markedly distended: Containing large amount of stool all the way down to the rectum. Findings suggest chronic constipation and fecal impaction. There also air distended loops of small bowel, likely diffuse ileus. Vascular/Lymphatic: The aorta is normal in caliber. No obvious large mesenteric or retroperitoneal mass. Reproductive: The prostate gland and seminal vesicles are grossly normal. Other: No obvious pelvic mass or large free pelvic fluid collection. Diffuse body wall edema suggesting anasarca. Musculoskeletal: No significant bony findings. IMPRESSION: 1. Extremely limited CT scan as discussed above. 2. Stool-filled distended colon suggesting chronic constipation, fecal impaction and probable colonic ileus. Small bowel ileus is also suspected. 3. No obvious/gross abnormality involving the solid abdominal organs but examination is limited. 4. Possible right adrenal gland lesion. 5. Patchy inflammatory or infectious findings at the lung bases along with atelectasis. Electronically Signed   By: Marijo Sanes M.D.   On: 02/10/2019 17:11   CT Head Wo Contrast  Result Date: 02/10/2019 CLINICAL DATA:  Encephalopathy. Additional provided: Patient "slumped over" during lunch with speech difficulty. EXAM: CT HEAD WITHOUT CONTRAST TECHNIQUE: Contiguous axial images were obtained from the base of the skull through the vertex without intravenous contrast. COMPARISON:  Head CT 11/23/2018 FINDINGS: Brain: No evidence of acute intracranial hemorrhage. No demarcated cortical infarction. No evidence of intracranial mass. No midline shift or extra-axial fluid collection. Ill-defined hypoattenuation within the cerebral white matter is nonspecific, but consistent with chronic small vessel ischemic disease. Mild generalized parenchymal atrophy. Vascular: No hyperdense vessel. Skull: No calvarial fracture or suspicious osseous lesion. Sinuses/Orbits: Visualized orbits demonstrate no acute  abnormality. A fracture deformity of the left nasal bone appears to have been present on prior head CT 10/20/2004, although displacement may be somewhat increased since that time. Partial opacification of right ethmoid air cells. No significant mastoid effusion. IMPRESSION: No evidence of acute intracranial abnormality. Generalized parenchymal atrophy and chronic small vessel ischemic disease. A fracture deformity of the left  nasal bone appears to have been present on prior head CT 10/20/2004, although displacement may be somewhat increased since that time. Clinical correlation is recommended. Partial opacification of right ethmoid air cells. Correlate for acute sinusitis. Electronically Signed   By: Kellie Simmering DO   On: 02/10/2019 17:10   DG Chest Port 1 View  Result Date: 02/11/2019 CLINICAL DATA:  Sepsis. EXAM: PORTABLE CHEST 1 VIEW COMPARISON:  February 10, 2019. FINDINGS: The heart size and mediastinal contours are within normal limits. Both lungs are clear. The visualized skeletal structures are unremarkable. IMPRESSION: No active disease. Electronically Signed   By: Marijo Conception M.D.   On: 02/11/2019 11:12   DG Chest Port 1 View  Result Date: 02/10/2019 CLINICAL DATA:  Altered mental status and hypotension EXAM: PORTABLE CHEST 1 VIEW COMPARISON:  December 06, 2018 FINDINGS: There is persistent elevation of the left hemidiaphragm. No edema or consolidation. Heart size and pulmonary vascularity are normal. No adenopathy. Visualized bowel loops in the upper abdomen appear distended. IMPRESSION: No edema or consolidation. Heart size normal. Elevation of left hemidiaphragm again noted. Visualized bowel appear somewhat distended. It may be prudent to correlate with abdominal imaging to further assess in this regard. Electronically Signed   By: Lowella Grip III M.D.   On: 02/10/2019 14:28     CBC Recent Labs  Lab 02/10/19 1508 02/11/19 0405 02/12/19 0616  WBC 4.0 5.1 5.4  HGB 8.5* 8.2*  8.0*  HCT 29.3* 29.0* 27.5*  PLT 211 205 193  MCV 94.5 91.2 92.0  MCH 27.4 25.8* 26.8  MCHC 29.0* 28.3* 29.1*  RDW 15.3 15.0 15.5  LYMPHSABS 1.0  --   --   MONOABS 0.4  --   --   EOSABS 0.0  --   --   BASOSABS 0.0  --   --     Chemistries  Recent Labs  Lab 02/10/19 1508 02/11/19 0405 02/12/19 0616  NA 140 145 143  K 4.1 4.7 4.6  CL 105 107 111  CO2 29 30 30   GLUCOSE 61* 58* 73  BUN 38* 34* 25*  CREATININE 2.02* 1.74* 1.59*  CALCIUM 8.0* 8.4* 8.5*  AST 36 37  --   ALT 7 13  --   ALKPHOS 86 81  --   BILITOT 0.7 0.7  --    ------------------------------------------------------------------------------------------------------------------ No results for input(s): CHOL, HDL, LDLCALC, TRIG, CHOLHDL, LDLDIRECT in the last 72 hours.  No results found for: HGBA1C ------------------------------------------------------------------------------------------------------------------ No results for input(s): TSH, T4TOTAL, T3FREE, THYROIDAB in the last 72 hours.  Invalid input(s): FREET3 ------------------------------------------------------------------------------------------------------------------ No results for input(s): VITAMINB12, FOLATE, FERRITIN, TIBC, IRON, RETICCTPCT in the last 72 hours.  Coagulation profile Recent Labs  Lab 02/10/19 1508 02/11/19 0405 02/12/19 0616  INR 3.8* 4.1* 3.1*    No results for input(s): DDIMER in the last 72 hours.  Cardiac Enzymes No results for input(s): CKMB, TROPONINI, MYOGLOBIN in the last 168 hours.  Invalid input(s): CK ------------------------------------------------------------------------------------------------------------------    Component Value Date/Time   BNP 14.0 11/22/2018 1427   Natarsha Hurwitz M.D on 02/12/2019 at 4:48 PM  Go to [email or web address] - for contact info  [facility] - Office  [phone]

## 2019-02-13 LAB — GLUCOSE, CAPILLARY
Glucose-Capillary: 112 mg/dL — ABNORMAL HIGH (ref 70–99)
Glucose-Capillary: 47 mg/dL — ABNORMAL LOW (ref 70–99)
Glucose-Capillary: 66 mg/dL — ABNORMAL LOW (ref 70–99)
Glucose-Capillary: 76 mg/dL (ref 70–99)
Glucose-Capillary: 76 mg/dL (ref 70–99)
Glucose-Capillary: 83 mg/dL (ref 70–99)

## 2019-02-13 LAB — CBC
HCT: 26.4 % — ABNORMAL LOW (ref 39.0–52.0)
Hemoglobin: 7.7 g/dL — ABNORMAL LOW (ref 13.0–17.0)
MCH: 26.9 pg (ref 26.0–34.0)
MCHC: 29.2 g/dL — ABNORMAL LOW (ref 30.0–36.0)
MCV: 92.3 fL (ref 80.0–100.0)
Platelets: 180 10*3/uL (ref 150–400)
RBC: 2.86 MIL/uL — ABNORMAL LOW (ref 4.22–5.81)
RDW: 15.4 % (ref 11.5–15.5)
WBC: 5.7 10*3/uL (ref 4.0–10.5)
nRBC: 0 % (ref 0.0–0.2)

## 2019-02-13 LAB — BASIC METABOLIC PANEL
Anion gap: 5 (ref 5–15)
BUN: 22 mg/dL (ref 8–23)
CO2: 28 mmol/L (ref 22–32)
Calcium: 8.8 mg/dL — ABNORMAL LOW (ref 8.9–10.3)
Chloride: 107 mmol/L (ref 98–111)
Creatinine, Ser: 1.41 mg/dL — ABNORMAL HIGH (ref 0.61–1.24)
GFR calc Af Amer: 58 mL/min — ABNORMAL LOW (ref >60)
GFR calc non Af Amer: 50 mL/min — ABNORMAL LOW (ref >60)
Glucose, Bld: 76 mg/dL (ref 70–99)
Potassium: 4.5 mmol/L (ref 3.5–5.1)
Sodium: 140 mmol/L (ref 135–145)

## 2019-02-13 LAB — PROTIME-INR
INR: 1.9 — ABNORMAL HIGH (ref 0.8–1.2)
Prothrombin Time: 21.3 seconds — ABNORMAL HIGH (ref 11.4–15.2)

## 2019-02-13 MED ORDER — ASPIRIN EC 81 MG PO TBEC: 81.0000 mg | DELAYED_RELEASE_TABLET | Freq: Every day | ORAL | 5 refills | Status: DC

## 2019-02-13 MED ORDER — TORSEMIDE 20 MG PO TABS: 20.0000 mg | ORAL_TABLET | ORAL | 1 refills | Status: DC

## 2019-02-13 MED ORDER — WARFARIN SODIUM 2.5 MG PO TABS
2.5000 mg | ORAL_TABLET | Freq: Once | ORAL | Status: AC
  Administered 2019-02-13: 16:00:00 2.5 mg via ORAL
  Filled 2019-02-13 (×2): qty 1

## 2019-02-13 MED ORDER — POLYETHYLENE GLYCOL 3350 17 GM/SCOOP PO POWD: 17.0000 g | Freq: Two times a day (BID) | ORAL | 4 refills | Status: AC

## 2019-02-13 MED ORDER — WARFARIN SODIUM 2 MG PO TABS: 2.0000 mg | ORAL_TABLET | Freq: Every evening | ORAL | 3 refills | Status: DC

## 2019-02-13 MED ORDER — ENSURE PO LIQD: 237.0000 mL | Freq: Two times a day (BID) | ORAL | 12 refills | Status: AC

## 2019-02-13 MED ORDER — POTASSIUM CHLORIDE CRYS ER 20 MEQ PO TBCR: 20.0000 meq | EXTENDED_RELEASE_TABLET | ORAL | 0 refills | Status: DC

## 2019-02-13 MED ORDER — CLONAZEPAM 0.5 MG PO TABS: 0.2500 mg | ORAL_TABLET | Freq: Every day | ORAL | 0 refills | Status: DC | PRN

## 2019-02-13 MED ORDER — SENNOSIDES-DOCUSATE SODIUM 8.6-50 MG PO TABS: 2.0000 | ORAL_TABLET | Freq: Every day | ORAL | 2 refills | Status: DC

## 2019-02-13 NOTE — Progress Notes (Signed)
Hypoglycemic Event  CBG: 47  Treatment: 4 oz juice/soda and 8 oz juice/soda  Symptoms: Shaky  Follow-up CBG: Time:0102 CBG Result:76  Possible Reasons for Event: Unknown  Comments/MD notified:    Ventura Bruns Tammi Boulier

## 2019-02-13 NOTE — Progress Notes (Signed)
ANTICOAGULATION CONSULT NOTE -  Pharmacy Consult for warfarin Indication: DVT  Patient Measurements: Height: 6' (182.9 cm) Weight: 151 lb 3.8 oz (68.6 kg) IBW/kg (Calculated) : 77.6   Vital Signs: Temp: 98.2 F (36.8 C) (12/20 0432) Temp Source: Oral (12/20 0432) BP: 131/76 (12/20 0432) Pulse Rate: 80 (12/20 0432)  Labs: Recent Labs    02/10/19 1508 02/11/19 0405 02/12/19 0616 02/13/19 0658  HGB 8.5* 8.2* 8.0* 7.7*  HCT 29.3* 29.0* 27.5* 26.4*  PLT 211 205 193 180  APTT 66*  --   --   --   LABPROT 37.7* 39.9* 31.6* 21.3*  INR 3.8* 4.1* 3.1* 1.9*  CREATININE 2.02* 1.74* 1.59*  --     Estimated Creatinine Clearance: 42.5 mL/min (A) (by C-G formula based on SCr of 1.59 mg/dL (H)).   Medical History: Past Medical History:  Diagnosis Date  . Cellulitis   . Dementia ([place])   . Edema   . Hypertension   . Hypoglycemia   . Mental retardation   . Parkinson's disease ([place])   . Psychosis ([place])   . Renal insufficiency     Assessment: 68 yo male admitted with possible sepsis. He has a recent history of a DVT from September of this year. He was originally started on Xarelto but stopped taking this - it is unclear when this was stopped. Most recently, he has been taking warfarin. His MAR reports last dose of warfarin was given on 12/16.  Home dose listed as 2.5 mg daily.  INR 1.9   Goal of Therapy:  INR 2-3 Monitor platelets by anticoagulation protocol: Yes    Plan:  Warfarin 2.5 mg x 1 dose. Monitor daily INR and s/s of bleeding.  Margot Ables, PharmD Clinical Pharmacist 02/13/2019 8:55 AM

## 2019-02-13 NOTE — Progress Notes (Signed)
Discharge instructions sent in package for [facility]

## 2019-02-13 NOTE — Discharge Summary (Signed)
[name], is a 68 y.o. male  DOB 24-May-1950  MRN [id number].  Admission date:  02/10/2019  Admitting Physician  Rolla Plate, DO  Discharge Date:  02/13/2019   Primary MD  Rosita Fire, MD  Recommendations for primary care physician for things to follow:   1) you are taking Coumadin/warfarin for blood thinner so Avoid ibuprofen/Advil/Aleve/Motrin/Goody Powders/Naproxen/BC powders/Meloxicam/Diclofenac/Indomethacin and other Nonsteroidal anti-inflammatory medications as these will make you more likely to bleed and can cause stomach ulcers, can also cause Kidney problems.   2) please repeat CBC, BMP and PT/INR blood test on Wednesday, 02/16/2019  3) please give Ensure 1 can 3 times a day to prevent low blood sugar  4) please give laxatives including MiraLAX and Senokot as prescribed to avoid severe constipation which patient has suffered from from time to time  Admission Diagnosis  Hypoglycemia [E16.2] Acute cystitis without hematuria [N30.00] Sepsis ([place]) [A41.9] Hypotension, unspecified hypotension type [I95.9] Constipation, unspecified constipation type [K59.00] Chronic kidney disease, unspecified CKD stage [N18.9] Sepsis, due to unspecified organism, unspecified whether acute organ dysfunction present [facility]) [A41.9]   Discharge Diagnosis  Hypoglycemia [E16.2] Acute cystitis without hematuria [N30.00] Sepsis ([place]) [A41.9] Hypotension, unspecified hypotension type [I95.9] Constipation, unspecified constipation type [K59.00] Chronic kidney disease, unspecified CKD stage [N18.9] Sepsis, due to unspecified organism, unspecified whether acute organ dysfunction present [facility]) [A41.9]    Principal Problem:   Sepsis ([place])      Past Medical History:  Diagnosis Date  . Cellulitis   . Dementia ([place])   . Edema   . Hypertension   . Hypoglycemia   . Mental retardation   .  Parkinson's disease ([place])   . Psychosis ([place])   . Renal insufficiency     Past Surgical History:  Procedure Laterality Date  . COLONOSCOPY N/A 03/12/2015   Procedure: COLONOSCOPY;  Surgeon: Danie Binder, MD;  Location: [facility];  Service: Endoscopy;  Laterality: N/A;  1430 - moved to 1/16 @ 1:45 - office to notify       HPI  from the history and physical done on the day of admission:    [name]  is a 68 y.o. male, with a history of Parkinson's, cognitive delay, HTN, and more who presents today with a chief complaint of syncope. Patient himself is not able to give much of a history. He has some difficulty with clear speech at baseline. He is able to answer yes and no questions about what he is feeling in the moment. It is unclear if his answers about symptoms that he has felt up until this point are accurate or not. ED provider spoke with staff at his group home who reported that he was in his normal state of health when he slumped over in his chair at lunch time, and was more difficult than normal to understand. EMS was called and reported hypoglycemia of 62 when they arrived on scene. Patient was given two glucose tabs en route. In the ED his glucose was 61 and he was  given an amp of dextrose. This reportedly improved his mental status. Patient complains of no pain right now, and reports that he does not remember his syncopal episode.   ED course WCB 4.0, BP 71/48, T 92.7 (rectally) Code sepsis called 75m/kg bolus Cefepime, flagyl and Vanc CXR shows  No edema or consolidation. Heart size normal. Elevation of left hemidiaphragm again noted. Visualized bowel appear somewhat distended. It may be prudent to correlate with abdominal imaging to further assess in this regard. CT abdomen - 1. Extremely limited CT scan as discussed above. 2. Stool-filled distended colon suggesting chronic constipation, fecal impaction and probable colonic ileus. Small bowel ileus is also  suspected. 3. No obvious/gross abnormality involving the solid abdominal organs but examination is limited. 4. Possible right adrenal gland lesion. 5. Patchy inflammatory or infectious findings at the lung bases along with atelectasis. CT head - No evidence of acute intracranial abnormality. Generalized parenchymal atrophy and chronic small vessel ischemic disease. A fracture deformity of the left nasal bone appears to have been present on prior head CT 10/20/2004, although displacement may be somewhat increased since that time. Clinical correlation is recommended. Partial opacification of right ethmoid air cells. Correlate for acute sinusitis. Hgb 8.5 FOBT negative INR 3.8 (on warfarin for DVT) Urine - mod leuks, positive nitrites, >50 urine WBC Urine culture pending Blood culture pending Warming blanket applied    Hospital Course:   - Brief Summary:- 68y.o.male,with a history of Parkinson's, cognitive delay, HTN admitted on 02/10/2019 with sepsis with hypotension and syncope   A/p 1)Possible Sepsis ---??? Source unknown,  -Pt met SIRS criteria UA without bacteria but with positive leukocytes nitrite and WBC-- -treated with IV cefepime and vancomycin -MRSA PCR is negative -Blood pressure improved with IV fluids -Blood and urine cultures NGTD  2) constipation/obstipation--patient having BMs with aggressive bowel regimen, discussed with general surgeon Dr. BConstance Haw no acute abdomen -No evidence of bowel obstruction -Patient had numerous bowel movements -Discharge home with laxatives  3) history of DVT - patient with DVT by LE venous u/s 11/22/18 right posterior tibial vein with thrombophlebitis right great saphenous vein.  Was unable to afford Xarelto , so switched to Coumadin -V/Q negative recently,  -INR down to 1.9 from 3.1 from 4.1 -Discharge on Coumadin 2 mg daily  4)AKI----acute kidney injury due to presumed UTI and dehydration creatinine on  admission= 2.0 , baseline creatinine = 1.2 (10/14/2018) , creatinine is now= 1.4, renally adjust medications, avoid nephrotoxic agents / dehydration / hypotension Change torsemide to Monday Wednesday Friday only  5)Dementia/Depressive disorder- stable,c/nbuspirone, clonazepam, Clozaril, on Aricept  6)Parkinson's -stable, continue Sinemetand Azilect  7) Disposition- - long term residentat the group home -HH RN and HH PT  At discharged  8)Hypoglycemia--- transient episodes of hypoglycemia -Improved with improved oral intake including Ensure  Disposition--back to group [place] and [place] at group home  Code Status:full  Family Communication:Discussed withBeverly RHuman resources officer([phone] administrator)  Disposition Plan:hh at group home  Consults :na  Discharge Condition:stable  Follow UP--Dr. FLegrand Ramswithin a week   Discharge Condition: stable Diet and Activity recommendation:  As advised  Discharge Instructions    Discharge Instructions    Call MD for:  difficulty breathing, headache or visual disturbances   Complete by: As directed    Call MD for:  persistant dizziness or light-headedness   Complete by: As directed    Call MD for:  persistant nausea and vomiting   Complete by: As directed    Call  MD for:  severe uncontrolled pain   Complete by: As directed    Call MD for:  temperature >100.4   Complete by: As directed    Diet - low sodium heart healthy   Complete by: As directed    Discharge instructions   Complete by: As directed    1) you are taking Coumadin/warfarin for blood thinner so Avoid ibuprofen/Advil/Aleve/Motrin/Goody Powders/Naproxen/BC powders/Meloxicam/Diclofenac/Indomethacin and other Nonsteroidal anti-inflammatory medications as these will make you more likely to bleed and can cause stomach ulcers, can also cause Kidney problems.   2) please repeat CBC, BMP and PT/INR blood test on Wednesday,  02/16/2019  3) please give Ensure 1 can 3 times a day to prevent low blood sugar  4) please give laxatives including MiraLAX and Senokot as prescribed to avoid severe constipation which patient has suffered from from time to time   Increase activity slowly   Complete by: As directed         Discharge Medications     Allergies as of 02/13/2019      Reactions   Penicillins    .Did it involve swelling of the face/tongue/throat, SOB, or low BP? Unknown Did it involve sudden or severe rash/hives, skin peeling, or any reaction on the inside of your mouth or nose? Unknown Did you need to seek medical attention at a hospital or doctor's office? Unknown When did it last happen?Unknown If all above answers are "NO", may proceed with cephalosporin use.      Medication List    TAKE these medications   acetaminophen 500 MG tablet Commonly known as: Q-PAP Take 1 tablet (500 mg total) by mouth every 6 (six) hours as needed for mild pain or fever.   amantadine 100 MG capsule Commonly known as: SYMMETREL Take 100 mg by mouth 2 (two) times daily.   aspirin EC 81 MG tablet Take 1 tablet (81 mg total) by mouth daily with breakfast. What changed: when to take this   busPIRone 15 MG tablet Commonly known as: BUSPAR Take 1 tablet (15 mg total) by mouth 2 (two) times daily.   carbidopa-levodopa 25-100 MG tablet Commonly known as: SINEMET IR Take 2 tablets by mouth 4 (four) times daily.   clonazePAM 0.5 MG tablet Commonly known as: KLONOPIN Take 0.5 tablets (0.25 mg total) by mouth daily as needed for anxiety.   clozapine 50 MG tablet Commonly known as: CLOZARIL Take 1 tablet (50 mg total) by mouth at bedtime.   donepezil 10 MG tablet Commonly known as: ARICEPT Take 1 tablet (10 mg total) by mouth daily.   Ensure Take 237 mLs by mouth 2 (two) times daily.   magnesium oxide 400 MG tablet Commonly known as: MAG-OX Take 1 tablet (400 mg total) by mouth 2 (two) times daily.    nystatin cream Commonly known as: MYCOSTATIN Apply 1 application topically 2 (two) times daily. Applied to feet   polyethylene glycol powder 17 GM/SCOOP powder Commonly known as: GaviLAX Take 17 g by mouth 2 (two) times daily. What changed: when to take this   potassium chloride SA 20 MEQ tablet Commonly known as: KLOR-CON Take 1 tablet (20 mEq total) by mouth every Monday, Wednesday, and Friday. Start taking on: February 14, 2019 What changed: when to take this   psyllium 0.52 g capsule Commonly known as: REGULOID Take 1.04 g by mouth every morning.   rasagiline 1 MG Tabs tablet Commonly known as: AZILECT Take 1 tablet (1 mg total) by mouth daily.   senna-docusate  8.6-50 MG tablet Commonly known as: Senokot-S Take 2 tablets by mouth at bedtime.   simvastatin 20 MG tablet Commonly known as: ZOCOR Take 20 mg by mouth daily.   tamsulosin 0.4 MG Caps capsule Commonly known as: FLOMAX Take 1 capsule (0.4 mg total) by mouth daily.   torsemide 20 MG tablet Commonly known as: DEMADEX Take 1 tablet (20 mg total) by mouth every Monday, Wednesday, and Friday. Start taking on: February 14, 2019 What changed: when to take this   warfarin 2 MG tablet Commonly known as: COUMADIN Take 1 tablet (2 mg total) by mouth every evening. What changed:   medication strength  how much to take  Another medication with the same name was removed. Continue taking this medication, and follow the directions you see here.       Major procedures and Radiology Reports - PLEASE review detailed and final reports for all details, in brief -   CT Abdomen Pelvis Wo Contrast  Result Date: 02/10/2019 CLINICAL DATA:  Abdominal distension.  Dementia. EXAM: CT ABDOMEN AND PELVIS WITHOUT CONTRAST TECHNIQUE: Multidetector CT imaging of the abdomen and pelvis was performed following the standard protocol without IV contrast. COMPARISON:  None. FINDINGS: Extremely limited examination due to lack of IV  and oral contrast, patient motion and arms being down by his side. Lower chest: A few patchy areas of inflammation or possible infection along with bibasilar atelectasis. No pleural effusions. The heart is within normal limits in size. No pericardial effusion. Hepatobiliary: No gross abnormality involving the liver is identified. The gallbladder appears normal. Pancreas: Very limited examination.  No obvious abnormality. Spleen: Normal size.  No gross abnormality. Adrenals/Urinary Tract: Very limited examination. No gross abnormality. I do not see any definite renal calculi or hydronephrosis. Possible right adrenal gland lesion Stomach/Bowel: Markedly distended: Containing large amount of stool all the way down to the rectum. Findings suggest chronic constipation and fecal impaction. There also air distended loops of small bowel, likely diffuse ileus. Vascular/Lymphatic: The aorta is normal in caliber. No obvious large mesenteric or retroperitoneal mass. Reproductive: The prostate gland and seminal vesicles are grossly normal. Other: No obvious pelvic mass or large free pelvic fluid collection. Diffuse body wall edema suggesting anasarca. Musculoskeletal: No significant bony findings. IMPRESSION: 1. Extremely limited CT scan as discussed above. 2. Stool-filled distended colon suggesting chronic constipation, fecal impaction and probable colonic ileus. Small bowel ileus is also suspected. 3. No obvious/gross abnormality involving the solid abdominal organs but examination is limited. 4. Possible right adrenal gland lesion. 5. Patchy inflammatory or infectious findings at the lung bases along with atelectasis. Electronically Signed   By: Marijo Sanes M.D.   On: 02/10/2019 17:11   CT Head Wo Contrast  Result Date: 02/10/2019 CLINICAL DATA:  Encephalopathy. Additional provided: Patient "slumped over" during lunch with speech difficulty. EXAM: CT HEAD WITHOUT CONTRAST TECHNIQUE: Contiguous axial images were  obtained from the base of the skull through the vertex without intravenous contrast. COMPARISON:  Head CT 11/23/2018 FINDINGS: Brain: No evidence of acute intracranial hemorrhage. No demarcated cortical infarction. No evidence of intracranial mass. No midline shift or extra-axial fluid collection. Ill-defined hypoattenuation within the cerebral white matter is nonspecific, but consistent with chronic small vessel ischemic disease. Mild generalized parenchymal atrophy. Vascular: No hyperdense vessel. Skull: No calvarial fracture or suspicious osseous lesion. Sinuses/Orbits: Visualized orbits demonstrate no acute abnormality. A fracture deformity of the left nasal bone appears to have been present on prior head CT 10/20/2004, although displacement may be  somewhat increased since that time. Partial opacification of right ethmoid air cells. No significant mastoid effusion. IMPRESSION: No evidence of acute intracranial abnormality. Generalized parenchymal atrophy and chronic small vessel ischemic disease. A fracture deformity of the left nasal bone appears to have been present on prior head CT 10/20/2004, although displacement may be somewhat increased since that time. Clinical correlation is recommended. Partial opacification of right ethmoid air cells. Correlate for acute sinusitis. Electronically Signed   By: Kellie Simmering DO   On: 02/10/2019 17:10   DG Chest Port 1 View  Result Date: 02/11/2019 CLINICAL DATA:  Sepsis. EXAM: PORTABLE CHEST 1 VIEW COMPARISON:  February 10, 2019. FINDINGS: The heart size and mediastinal contours are within normal limits. Both lungs are clear. The visualized skeletal structures are unremarkable. IMPRESSION: No active disease. Electronically Signed   By: Marijo Conception M.D.   On: 02/11/2019 11:12   DG Chest Port 1 View  Result Date: 02/10/2019 CLINICAL DATA:  Altered mental status and hypotension EXAM: PORTABLE CHEST 1 VIEW COMPARISON:  December 06, 2018 FINDINGS: There is  persistent elevation of the left hemidiaphragm. No edema or consolidation. Heart size and pulmonary vascularity are normal. No adenopathy. Visualized bowel loops in the upper abdomen appear distended. IMPRESSION: No edema or consolidation. Heart size normal. Elevation of left hemidiaphragm again noted. Visualized bowel appear somewhat distended. It may be prudent to correlate with abdominal imaging to further assess in this regard. Electronically Signed   By: Lowella Grip III M.D.   On: 02/10/2019 14:28    Micro Results    Recent Results (from the past 240 hour(s))  Urine culture     Status: None   Collection Time: 02/10/19  2:05 PM   Specimen: In/Out Cath Urine  Result Value Ref Range Status   Specimen Description   Final    IN/OUT CATH URINE Performed at [facility], [address], [place], [place] [address]    Special Requests   Final    NONE Performed at [facility], [address], [place], [place] [address]    Culture   Final    NO GROWTH Performed at [facility], [place] [address], [place],  [address]    Report Status 02/12/2019 FINAL  Final  Respiratory Panel by RT PCR (Flu A&B, Covid) - Nasopharyngeal Swab     Status: None   Collection Time: 02/10/19  2:44 PM   Specimen: Nasopharyngeal Swab  Result Value Ref Range Status   SARS Coronavirus 2 by RT PCR NEGATIVE NEGATIVE Final    Comment: (NOTE) SARS-CoV-2 target nucleic acids are NOT DETECTED. The SARS-CoV-2 RNA is generally detectable in upper respiratoy specimens during the acute phase of infection. The lowest concentration of SARS-CoV-2 viral copies this assay can detect is 131 copies/mL. A negative result does not preclude SARS-Cov-2 infection and should not be used as the sole basis for treatment or other patient management decisions. A negative result may occur with  improper specimen collection/handling, submission of specimen other than nasopharyngeal swab, presence of viral mutation(s) within  the areas targeted by this assay, and inadequate number of viral copies (<131 copies/mL). A negative result must be combined with clinical observations, patient history, and epidemiological information. The expected result is Negative. Fact Sheet for Patients:  PinkCheek.be Fact Sheet for Healthcare Providers:  GravelBags.it This test is not yet ap proved or cleared by the [place] FDA and  has been authorized for detection and/or diagnosis of SARS-CoV-2 by FDA under an  Emergency Use Authorization (EUA). This EUA will remain  in effect (meaning this test can be used) for the duration of the COVID-19 declaration under Section 564(b)(1) of the Act, 21 U.S.C. section 360bbb-3(b)(1), unless the authorization is terminated or revoked sooner.    Influenza A by PCR NEGATIVE NEGATIVE Final   Influenza B by PCR NEGATIVE NEGATIVE Final    Comment: (NOTE) The Xpert Xpress SARS-CoV-2/FLU/RSV assay is intended as an aid in  the diagnosis of influenza from Nasopharyngeal swab specimens and  should not be used as a sole basis for treatment. Nasal washings and  aspirates are unacceptable for Xpert Xpress SARS-CoV-2/FLU/RSV  testing. Fact Sheet for Patients: PinkCheek.be Fact Sheet for Healthcare Providers: GravelBags.it This test is not yet approved or cleared by the [place] FDA and  has been authorized for detection and/or diagnosis of SARS-CoV-2 by  FDA under an Emergency Use Authorization (EUA). This EUA will remain  in effect (meaning this test can be used) for the duration of the  Covid-19 declaration under Section 564(b)(1) of the Act, 21  U.S.C. section 360bbb-3(b)(1), unless the authorization is  terminated or revoked. Performed at [facility], [address], [place], [place] [address]   Blood culture (routine x 2)     Status: None (Preliminary result)    Collection Time: 02/10/19  3:00 PM   Specimen: Left Antecubital; Blood  Result Value Ref Range Status   Specimen Description LEFT ANTECUBITAL  Final   Special Requests   Final    BOTTLES DRAWN AEROBIC AND ANAEROBIC Blood Culture adequate volume   Culture   Final    NO GROWTH 3 DAYS Performed at [facility], [address] [place], [place] [address]    Report Status PENDING  Incomplete  Blood culture (routine x 2)     Status: None (Preliminary result)   Collection Time: 02/10/19  3:09 PM   Specimen: BLOOD LEFT WRIST  Result Value Ref Range Status   Specimen Description BLOOD LEFT WRIST  Final   Special Requests   Final    BOTTLES DRAWN AEROBIC AND ANAEROBIC Blood Culture adequate volume   Culture   Final    NO GROWTH 3 DAYS Performed at [facility], [address], [place], [place] [address]    Report Status PENDING  Incomplete  MRSA PCR Screening     Status: None   Collection Time: 02/11/19 12:09 AM   Specimen: Nasal Mucosa; Nasopharyngeal  Result Value Ref Range Status   MRSA by PCR NEGATIVE NEGATIVE Final    Comment:        The GeneXpert MRSA Assay (FDA approved for NASAL specimens only), is one component of a comprehensive MRSA colonization surveillance program. It is not intended to diagnose MRSA infection nor to guide or monitor treatment for MRSA infections. Performed at [facility], [address] [place], [place] [address]        Today   Subjective    [name] today has no new complaints -Eating and  drinking well -Having bowel movements          Patient has been seen and examined prior to discharge   Objective   Blood pressure 131/66, pulse 65, temperature 97.6 F (36.4 C), temperature source Oral, resp. rate 16, height 6' (1.829 m), weight 68.6 kg, SpO2 95 %.   Intake/Output Summary (Last 24 hours) at 02/13/2019 1742 Last data filed at 02/13/2019 1500 Gross per 24 hour  Intake 1593.28 ml  Output 1600 ml  Net -6.72  ml     Exam Gen:- Awake Alert, no acute distress,resting comfortably HEENT:- [place].AT, No sclera icterus Neck-Supple Neck,No JVD,.  Lungs- CTAB , good air movement bilaterally  CV- S1, S2 normal, regular Abd- +ve B.Sounds, Abd Soft, No tenderness,  Extremity/Skin:-Left lower extremity swelling (INR has been therapeutic),good pulses Psych-affect is appropriate,intermittent episodes of confusion and disorientation Neuro-generalized weakness,no new focal  Deficits ,occasionaltremors    Data Review   CBC w Diff:  Lab Results  Component Value Date   WBC 5.7 02/13/2019   HGB 7.7 (L) 02/13/2019   HCT 26.4 (L) 02/13/2019   PLT 180 02/13/2019   LYMPHOPCT 25 02/10/2019   MONOPCT 11 02/10/2019   EOSPCT 0 02/10/2019   BASOPCT 0 02/10/2019    CMP:  Lab Results  Component Value Date   NA 140 02/13/2019   K 4.5 02/13/2019   CL 107 02/13/2019   CO2 28 02/13/2019   BUN 22 02/13/2019   CREATININE 1.41 (H) 02/13/2019   PROT 6.1 (L) 02/11/2019   ALBUMIN 3.0 (L) 02/11/2019   BILITOT 0.7 02/11/2019   ALKPHOS 81 02/11/2019   AST 37 02/11/2019   ALT 13 02/11/2019  .   Total Discharge time is about 33 minutes  Roxan Hockey M.D on 02/13/2019 at 5:42 PM  Go to [email or web address] -  for contact info  [facility] - Office  [phone]

## 2019-02-13 NOTE — Discharge Instructions (Signed)
1) you are taking Coumadin/warfarin for blood thinner so Avoid ibuprofen/Advil/Aleve/Motrin/Goody Powders/Naproxen/BC powders/Meloxicam/Diclofenac/Indomethacin and other Nonsteroidal anti-inflammatory medications as these will make you more likely to bleed and can cause stomach ulcers, can also cause Kidney problems.   2) please repeat CBC, BMP and PT/INR blood test on Wednesday, 02/16/2019  3) please give Ensure 1 can 3 times a day to prevent low blood sugar  4) please give laxatives including MiraLAX and Senokot as prescribed to avoid severe constipation which patient has suffered from from time to time

## 2019-02-15 LAB — CULTURE, BLOOD (ROUTINE X 2)
Culture: NO GROWTH
Culture: NO GROWTH
Special Requests: ADEQUATE
Special Requests: ADEQUATE

## 2019-03-01 DIAGNOSIS — Z79899 Other long term (current) drug therapy: Secondary | ICD-10-CM | POA: Diagnosis not present

## 2019-03-08 ENCOUNTER — Encounter (HOSPITAL_COMMUNITY): Payer: Self-pay

## 2019-03-08 ENCOUNTER — Emergency Department (HOSPITAL_COMMUNITY): Payer: Medicare HMO

## 2019-03-08 ENCOUNTER — Inpatient Hospital Stay (HOSPITAL_COMMUNITY)
Admission: EM | Admit: 2019-03-08 | Discharge: 2019-03-18 | DRG: 871 | Disposition: A | Discharge status: Skilled Nursing Facility | Payer: Medicare HMO | Attending: Internal Medicine | Admitting: Internal Medicine

## 2019-03-08 ENCOUNTER — Other Ambulatory Visit: Payer: Self-pay

## 2019-03-08 DIAGNOSIS — G9341 Metabolic encephalopathy: Secondary | ICD-10-CM | POA: Diagnosis not present

## 2019-03-08 DIAGNOSIS — F79 Unspecified intellectual disabilities: Secondary | ICD-10-CM | POA: Diagnosis present

## 2019-03-08 DIAGNOSIS — T68XXXA Hypothermia, initial encounter: Secondary | ICD-10-CM | POA: Diagnosis not present

## 2019-03-08 DIAGNOSIS — S065X9A Traumatic subdural hemorrhage with loss of consciousness of unspecified duration, initial encounter: Secondary | ICD-10-CM

## 2019-03-08 DIAGNOSIS — Z8672 Personal history of thrombophlebitis: Secondary | ICD-10-CM

## 2019-03-08 DIAGNOSIS — I959 Hypotension, unspecified: Secondary | ICD-10-CM | POA: Diagnosis not present

## 2019-03-08 DIAGNOSIS — E785 Hyperlipidemia, unspecified: Secondary | ICD-10-CM | POA: Diagnosis not present

## 2019-03-08 DIAGNOSIS — R6521 Severe sepsis with septic shock: Secondary | ICD-10-CM | POA: Diagnosis not present

## 2019-03-08 DIAGNOSIS — D631 Anemia in chronic kidney disease: Secondary | ICD-10-CM | POA: Diagnosis present

## 2019-03-08 DIAGNOSIS — G20 Parkinson's disease: Secondary | ICD-10-CM | POA: Diagnosis not present

## 2019-03-08 DIAGNOSIS — I6203 Nontraumatic chronic subdural hemorrhage: Secondary | ICD-10-CM | POA: Diagnosis not present

## 2019-03-08 DIAGNOSIS — S065XAA Traumatic subdural hemorrhage with loss of consciousness status unknown, initial encounter: Secondary | ICD-10-CM

## 2019-03-08 DIAGNOSIS — K5900 Constipation, unspecified: Secondary | ICD-10-CM | POA: Diagnosis present

## 2019-03-08 DIAGNOSIS — I6200 Nontraumatic subdural hemorrhage, unspecified: Secondary | ICD-10-CM | POA: Diagnosis not present

## 2019-03-08 DIAGNOSIS — R410 Disorientation, unspecified: Secondary | ICD-10-CM

## 2019-03-08 DIAGNOSIS — Z79899 Other long term (current) drug therapy: Secondary | ICD-10-CM

## 2019-03-08 DIAGNOSIS — F209 Schizophrenia, unspecified: Secondary | ICD-10-CM | POA: Diagnosis not present

## 2019-03-08 DIAGNOSIS — F17210 Nicotine dependence, cigarettes, uncomplicated: Secondary | ICD-10-CM | POA: Diagnosis present

## 2019-03-08 DIAGNOSIS — R680 Hypothermia, not associated with low environmental temperature: Secondary | ICD-10-CM | POA: Diagnosis present

## 2019-03-08 DIAGNOSIS — J189 Pneumonia, unspecified organism: Secondary | ICD-10-CM | POA: Diagnosis not present

## 2019-03-08 DIAGNOSIS — G9340 Encephalopathy, unspecified: Secondary | ICD-10-CM | POA: Diagnosis not present

## 2019-03-08 DIAGNOSIS — I129 Hypertensive chronic kidney disease with stage 1 through stage 4 chronic kidney disease, or unspecified chronic kidney disease: Secondary | ICD-10-CM | POA: Diagnosis present

## 2019-03-08 DIAGNOSIS — Z7901 Long term (current) use of anticoagulants: Secondary | ICD-10-CM

## 2019-03-08 DIAGNOSIS — A419 Sepsis, unspecified organism: Secondary | ICD-10-CM | POA: Diagnosis not present

## 2019-03-08 DIAGNOSIS — E871 Hypo-osmolality and hyponatremia: Secondary | ICD-10-CM | POA: Diagnosis not present

## 2019-03-08 DIAGNOSIS — L8990 Pressure ulcer of unspecified site, unspecified stage: Secondary | ICD-10-CM | POA: Insufficient documentation

## 2019-03-08 DIAGNOSIS — R404 Transient alteration of awareness: Secondary | ICD-10-CM | POA: Diagnosis not present

## 2019-03-08 DIAGNOSIS — R001 Bradycardia, unspecified: Secondary | ICD-10-CM | POA: Diagnosis not present

## 2019-03-08 DIAGNOSIS — D649 Anemia, unspecified: Secondary | ICD-10-CM | POA: Diagnosis not present

## 2019-03-08 DIAGNOSIS — R791 Abnormal coagulation profile: Secondary | ICD-10-CM | POA: Diagnosis present

## 2019-03-08 DIAGNOSIS — E162 Hypoglycemia, unspecified: Secondary | ICD-10-CM | POA: Diagnosis not present

## 2019-03-08 DIAGNOSIS — R739 Hyperglycemia, unspecified: Secondary | ICD-10-CM | POA: Diagnosis not present

## 2019-03-08 DIAGNOSIS — M6281 Muscle weakness (generalized): Secondary | ICD-10-CM | POA: Diagnosis present

## 2019-03-08 DIAGNOSIS — R531 Weakness: Secondary | ICD-10-CM | POA: Diagnosis not present

## 2019-03-08 DIAGNOSIS — K117 Disturbances of salivary secretion: Secondary | ICD-10-CM | POA: Diagnosis present

## 2019-03-08 DIAGNOSIS — J181 Lobar pneumonia, unspecified organism: Secondary | ICD-10-CM | POA: Diagnosis not present

## 2019-03-08 DIAGNOSIS — R627 Adult failure to thrive: Secondary | ICD-10-CM | POA: Diagnosis present

## 2019-03-08 DIAGNOSIS — R1310 Dysphagia, unspecified: Secondary | ICD-10-CM | POA: Diagnosis not present

## 2019-03-08 DIAGNOSIS — J9692 Respiratory failure, unspecified with hypercapnia: Secondary | ICD-10-CM | POA: Diagnosis not present

## 2019-03-08 DIAGNOSIS — F0280 Dementia in other diseases classified elsewhere without behavioral disturbance: Secondary | ICD-10-CM | POA: Diagnosis present

## 2019-03-08 DIAGNOSIS — L89892 Pressure ulcer of other site, stage 2: Secondary | ICD-10-CM | POA: Diagnosis present

## 2019-03-08 DIAGNOSIS — R918 Other nonspecific abnormal finding of lung field: Secondary | ICD-10-CM | POA: Diagnosis not present

## 2019-03-08 DIAGNOSIS — Z7181 Spiritual or religious counseling: Secondary | ICD-10-CM

## 2019-03-08 DIAGNOSIS — Z86718 Personal history of other venous thrombosis and embolism: Secondary | ICD-10-CM

## 2019-03-08 DIAGNOSIS — Z20822 Contact with and (suspected) exposure to covid-19: Secondary | ICD-10-CM | POA: Diagnosis not present

## 2019-03-08 DIAGNOSIS — Z7982 Long term (current) use of aspirin: Secondary | ICD-10-CM

## 2019-03-08 DIAGNOSIS — R5381 Other malaise: Secondary | ICD-10-CM | POA: Diagnosis not present

## 2019-03-08 DIAGNOSIS — Z7401 Bed confinement status: Secondary | ICD-10-CM | POA: Diagnosis not present

## 2019-03-08 DIAGNOSIS — L89150 Pressure ulcer of sacral region, unstageable: Secondary | ICD-10-CM | POA: Diagnosis not present

## 2019-03-08 DIAGNOSIS — M2550 Pain in unspecified joint: Secondary | ICD-10-CM | POA: Diagnosis not present

## 2019-03-08 DIAGNOSIS — R4182 Altered mental status, unspecified: Secondary | ICD-10-CM | POA: Diagnosis not present

## 2019-03-08 DIAGNOSIS — N1831 Chronic kidney disease, stage 3a: Secondary | ICD-10-CM | POA: Diagnosis present

## 2019-03-08 DIAGNOSIS — D62 Acute posthemorrhagic anemia: Secondary | ICD-10-CM | POA: Diagnosis not present

## 2019-03-08 DIAGNOSIS — Z515 Encounter for palliative care: Secondary | ICD-10-CM

## 2019-03-08 DIAGNOSIS — Z7189 Other specified counseling: Secondary | ICD-10-CM

## 2019-03-08 DIAGNOSIS — R471 Dysarthria and anarthria: Secondary | ICD-10-CM | POA: Diagnosis present

## 2019-03-08 DIAGNOSIS — E538 Deficiency of other specified B group vitamins: Secondary | ICD-10-CM | POA: Diagnosis present

## 2019-03-08 DIAGNOSIS — L97819 Non-pressure chronic ulcer of other part of right lower leg with unspecified severity: Secondary | ICD-10-CM | POA: Diagnosis not present

## 2019-03-08 DIAGNOSIS — Z789 Other specified health status: Secondary | ICD-10-CM

## 2019-03-08 DIAGNOSIS — L8992 Pressure ulcer of unspecified site, stage 2: Secondary | ICD-10-CM | POA: Diagnosis not present

## 2019-03-08 DIAGNOSIS — S199XXA Unspecified injury of neck, initial encounter: Secondary | ICD-10-CM | POA: Diagnosis not present

## 2019-03-08 LAB — RETICULOCYTES
Immature Retic Fract: 31.1 % — ABNORMAL HIGH (ref 2.3–15.9)
RBC.: 1.8 MIL/uL — ABNORMAL LOW (ref 4.22–5.81)
Retic Count, Absolute: 116.6 10*3/uL (ref 19.0–186.0)
Retic Ct Pct: 6.5 % — ABNORMAL HIGH (ref 0.4–3.1)

## 2019-03-08 LAB — URINALYSIS, ROUTINE W REFLEX MICROSCOPIC
Bacteria, UA: NONE SEEN
Bilirubin Urine: NEGATIVE
Glucose, UA: NEGATIVE mg/dL
Ketones, ur: NEGATIVE mg/dL
Leukocytes,Ua: NEGATIVE
Nitrite: NEGATIVE
Protein, ur: NEGATIVE mg/dL
Specific Gravity, Urine: 1.015 (ref 1.005–1.030)
pH: 5 (ref 5.0–8.0)

## 2019-03-08 LAB — CBC WITH DIFFERENTIAL/PLATELET
Abs Immature Granulocytes: 0.02 10*3/uL (ref 0.00–0.07)
Basophils Absolute: 0 10*3/uL (ref 0.0–0.1)
Basophils Relative: 0 %
Eosinophils Absolute: 0 10*3/uL (ref 0.0–0.5)
Eosinophils Relative: 0 %
HCT: 20.2 % — ABNORMAL LOW (ref 39.0–52.0)
Hemoglobin: 5.5 g/dL — CL (ref 13.0–17.0)
Immature Granulocytes: 0 %
Lymphocytes Relative: 16 %
Lymphs Abs: 0.7 10*3/uL (ref 0.7–4.0)
MCH: 25.5 pg — ABNORMAL LOW (ref 26.0–34.0)
MCHC: 27.2 g/dL — ABNORMAL LOW (ref 30.0–36.0)
MCV: 93.5 fL (ref 80.0–100.0)
Monocytes Absolute: 0.3 10*3/uL (ref 0.1–1.0)
Monocytes Relative: 7 %
Neutro Abs: 3.5 10*3/uL (ref 1.7–7.7)
Neutrophils Relative %: 77 %
Platelets: 310 10*3/uL (ref 150–400)
RBC: 2.16 MIL/uL — ABNORMAL LOW (ref 4.22–5.81)
RDW: 17.1 % — ABNORMAL HIGH (ref 11.5–15.5)
WBC: 4.6 10*3/uL (ref 4.0–10.5)
nRBC: 0.7 % — ABNORMAL HIGH (ref 0.0–0.2)

## 2019-03-08 LAB — COMPREHENSIVE METABOLIC PANEL
ALT: 7 U/L (ref 0–44)
AST: 31 U/L (ref 15–41)
Albumin: 2.8 g/dL — ABNORMAL LOW (ref 3.5–5.0)
Alkaline Phosphatase: 90 U/L (ref 38–126)
Anion gap: 6 (ref 5–15)
BUN: 31 mg/dL — ABNORMAL HIGH (ref 8–23)
CO2: 31 mmol/L (ref 22–32)
Calcium: 8.6 mg/dL — ABNORMAL LOW (ref 8.9–10.3)
Chloride: 104 mmol/L (ref 98–111)
Creatinine, Ser: 1.66 mg/dL — ABNORMAL HIGH (ref 0.61–1.24)
GFR calc Af Amer: 48 mL/min — ABNORMAL LOW (ref >60)
GFR calc non Af Amer: 41 mL/min — ABNORMAL LOW (ref >60)
Glucose, Bld: 90 mg/dL (ref 70–99)
Potassium: 4.1 mmol/L (ref 3.5–5.1)
Sodium: 141 mmol/L (ref 135–145)
Total Bilirubin: 1.2 mg/dL (ref 0.3–1.2)
Total Protein: 6.2 g/dL — ABNORMAL LOW (ref 6.5–8.1)

## 2019-03-08 LAB — CBG MONITORING, ED
Glucose-Capillary: 101 mg/dL — ABNORMAL HIGH (ref 70–99)
Glucose-Capillary: 103 mg/dL — ABNORMAL HIGH (ref 70–99)
Glucose-Capillary: 52 mg/dL — ABNORMAL LOW (ref 70–99)
Glucose-Capillary: 58 mg/dL — ABNORMAL LOW (ref 70–99)
Glucose-Capillary: 68 mg/dL — ABNORMAL LOW (ref 70–99)
Glucose-Capillary: 76 mg/dL (ref 70–99)
Glucose-Capillary: 84 mg/dL (ref 70–99)

## 2019-03-08 LAB — RESPIRATORY PANEL BY RT PCR (FLU A&B, COVID)
Influenza A by PCR: NEGATIVE
Influenza B by PCR: NEGATIVE
SARS Coronavirus 2 by RT PCR: NEGATIVE

## 2019-03-08 LAB — POC OCCULT BLOOD, ED: Fecal Occult Bld: NEGATIVE

## 2019-03-08 LAB — PROTIME-INR
INR: 3.2 — ABNORMAL HIGH (ref 0.8–1.2)
Prothrombin Time: 32.6 seconds — ABNORMAL HIGH (ref 11.4–15.2)

## 2019-03-08 LAB — IRON AND TIBC
Iron: 49 ug/dL (ref 45–182)
Saturation Ratios: 22 % (ref 17.9–39.5)
TIBC: 228 ug/dL — ABNORMAL LOW (ref 250–450)
UIBC: 179 ug/dL

## 2019-03-08 LAB — ABO/RH: ABO/RH(D): A POS

## 2019-03-08 LAB — LACTIC ACID, PLASMA
Lactic Acid, Venous: 1.2 mmol/L (ref 0.5–1.9)
Lactic Acid, Venous: 1.1 mmol/L (ref 0.5–1.9)

## 2019-03-08 LAB — PREPARE RBC (CROSSMATCH)

## 2019-03-08 LAB — FERRITIN: Ferritin: 296 ng/mL (ref 24–336)

## 2019-03-08 LAB — PROCALCITONIN: Procalcitonin: <0.1 ng/mL

## 2019-03-08 LAB — VITAMIN B12: Vitamin B-12: 544 pg/mL (ref 180–914)

## 2019-03-08 LAB — POC SARS CORONAVIRUS 2 AG -  ED: SARS Coronavirus 2 Ag: NEGATIVE

## 2019-03-08 LAB — FOLATE: Folate: 4.9 ng/mL — ABNORMAL LOW (ref >5.9)

## 2019-03-08 MED ORDER — LORAZEPAM 2 MG/ML IJ SOLN: 1.0000 mg | Freq: Once | INTRAMUSCULAR | Status: AC

## 2019-03-08 MED ORDER — DEXTROSE 5 % IV SOLN: Freq: Once | INTRAVENOUS | Status: AC

## 2019-03-08 MED ORDER — DEXTROSE 50 % IV SOLN
1.0000 | Freq: Once | INTRAVENOUS | Status: DC
  Filled 2019-03-08: qty 50

## 2019-03-08 MED ORDER — VANCOMYCIN HCL IN DEXTROSE 1-5 GM/200ML-% IV SOLN
1000.0000 mg | Freq: Once | INTRAVENOUS | Status: AC
  Administered 2019-03-08: 1000 mg via INTRAVENOUS
  Filled 2019-03-08: qty 200

## 2019-03-08 MED ORDER — DEXTROSE IN LACTATED RINGERS 5 % IV SOLN: INTRAVENOUS | Status: DC

## 2019-03-08 MED ORDER — FENTANYL CITRATE (PF) 100 MCG/2ML IJ SOLN
INTRAMUSCULAR | Status: AC
  Administered 2019-03-08: 50 ug
  Filled 2019-03-08: qty 2

## 2019-03-08 MED ORDER — SODIUM CHLORIDE 0.9 % IV SOLN
2.0000 g | Freq: Once | INTRAVENOUS | Status: AC
  Administered 2019-03-08: 2 g via INTRAVENOUS
  Filled 2019-03-08 (×2): qty 2

## 2019-03-08 MED ORDER — NOREPINEPHRINE BITARTRATE 1 MG/ML IV SOLN: 0.0500 ug/kg/min | Freq: Once | INTRAVENOUS | Status: DC

## 2019-03-08 MED ORDER — LORAZEPAM 2 MG/ML IJ SOLN
INTRAMUSCULAR | Status: AC
  Administered 2019-03-08: 0.5 mg via INTRAVENOUS
  Filled 2019-03-08: qty 1

## 2019-03-08 MED ORDER — VANCOMYCIN HCL IN DEXTROSE 1-5 GM/200ML-% IV SOLN
1000.0000 mg | INTRAVENOUS | Status: DC
  Administered 2019-03-09: 1000 mg via INTRAVENOUS
  Filled 2019-03-08: qty 200

## 2019-03-08 MED ORDER — NOREPINEPHRINE 4 MG/250ML-% IV SOLN
2.0000 ug/min | INTRAVENOUS | Status: DC
  Administered 2019-03-08: 2 ug/min via INTRAVENOUS
  Filled 2019-03-08: qty 250

## 2019-03-08 MED ORDER — SODIUM CHLORIDE 0.9 % IV SOLN
250.0000 mL | INTRAVENOUS | Status: DC
  Administered 2019-03-08 – 2019-03-14 (×2): 250 mL via INTRAVENOUS

## 2019-03-08 MED ORDER — DEXTROSE 50 % IV SOLN
1.0000 | Freq: Once | INTRAVENOUS | Status: AC
  Administered 2019-03-08: 50 mL via INTRAVENOUS

## 2019-03-08 MED ORDER — DEXTROSE 50 % IV SOLN
1.0000 | Freq: Once | INTRAVENOUS | Status: AC
  Administered 2019-03-08: 50 mL via INTRAVENOUS
  Filled 2019-03-08: qty 50

## 2019-03-08 MED ORDER — SODIUM CHLORIDE 0.9 % IV BOLUS
1000.0000 mL | Freq: Once | INTRAVENOUS | Status: AC
  Administered 2019-03-08: 1000 mL via INTRAVENOUS

## 2019-03-08 MED ORDER — VITAMIN K1 10 MG/ML IJ SOLN
1.0000 mg | Freq: Once | INTRAVENOUS | Status: AC
  Administered 2019-03-08: 1 mg via INTRAVENOUS
  Filled 2019-03-08: qty 0.1

## 2019-03-08 MED ORDER — SODIUM CHLORIDE 0.9 % IV SOLN
10.0000 mL/h | Freq: Once | INTRAVENOUS | Status: AC
  Administered 2019-03-08: 10 mL/h via INTRAVENOUS

## 2019-03-08 MED ORDER — SODIUM CHLORIDE 0.9 % IV SOLN
2.0000 g | Freq: Two times a day (BID) | INTRAVENOUS | Status: AC
  Administered 2019-03-09 – 2019-03-15 (×14): 2 g via INTRAVENOUS
  Filled 2019-03-08 (×14): qty 2

## 2019-03-08 MED ORDER — VANCOMYCIN HCL 500 MG/100ML IV SOLN
500.0000 mg | Freq: Once | INTRAVENOUS | Status: AC
  Administered 2019-03-08: 500 mg via INTRAVENOUS
  Filled 2019-03-08: qty 100

## 2019-03-08 MED ORDER — SODIUM CHLORIDE 0.9 % IV SOLN
500.0000 mg | Freq: Once | INTRAVENOUS | Status: AC
  Administered 2019-03-08: 500 mg via INTRAVENOUS
  Filled 2019-03-08: qty 500

## 2019-03-08 MED ORDER — DEXTROSE 50 % IV SOLN
INTRAVENOUS | Status: AC
  Filled 2019-03-08: qty 50

## 2019-03-08 NOTE — ED Notes (Signed)
CRITICAL VALUE ALERT  Critical Value:  Hemoglobin 5.5  Date & Time Notied:  03/08/2019  Provider Notified: Dr. Lacinda Axon  Orders Received/Actions taken: see chart

## 2019-03-08 NOTE — H&P (Addendum)
NAME:  [name], MRN:  [id number], DOB:  1950/09/16, LOS: 0 ADMISSION DATE:  03/08/2019, CONSULTATION DATE:  03/08/2019 REFERRING MD:  Nils Flack, MD, CHIEF COMPLAINT:  Septic Shock    History of present illness   69 year old male presents to ED 03/08/2019 from [facility] with altered mental status, hypothermia, and hypotension. Reportedly patient was at baseline mentation last night, this morning he was noted to have garbled dysarthric speech. On arrival to ED BP 86/40. Temp 90. Hemoglobin 5.5. CXR with opacity to right upper lobe. Given Cefepime/Azithromycin/Vancomycin. COVID negative. Transfused 2 units RBC. Due to continued hypotension placed on levophed. CT Head with SDH 9 mm with mild mass effect of left cerebral hemisphere. Neurosurgery Consulted. Reviewed imaging. Believes its subacute. Recommends continuing to hold anticoagulation. Critical Care consulted for admission.   Past Medical History  Dementia, Intellectual Disability, Parkinson's, Schizophrenia, DVT 10/2018 on Warfarin   Significant Hospital Events   1/12 > Presents to [facility]  1/13 > Transferred to [place]:  Neurosurgery  PCCM  Procedures:  N/A   Significant Diagnostic Tests:  CXR 1/12 > 1. Subtle asymmetric opacity in the right upper lobe is new from previous exam. Cannot rule out early pneumonia. Followup PA and lateral chest X-ray is recommended in 3-4 weeks following trial of antibiotic therapy to ensure resolution and exclude underlying Malignancy. 2. No heart failure. CT Head/C-Spine 1/12 > Interval development of a thin intermediate density subdural hematoma overlying the left cerebral convexity measuring 9 mm in thickness. Mild mass effect upon the underlying left cerebral hemisphere. 2 mm rightward midline shift. Stable generalized parenchymal atrophy and chronic small vessel ischemic disease. Severe multilevel degenerative disc disease. No acute abnormality seen in the cervical spine.  Micro Data:    Blood 1/12 >> U/A 1/12  > Negative   Antimicrobials:  Cefepime/Vanomycin/Azithromycin ED 1/12  Interim history/subjective:    Objective   Blood pressure 102/70, pulse 69, temperature (!) 97.4 F (36.3 C), temperature source Rectal, resp. rate 16, SpO2 96 %.        Intake/Output Summary (Last 24 hours) at 03/08/2019 2325 Last data filed at 03/08/2019 1953 Gross per 24 hour  Intake 300 ml  Output --  Net 300 ml   There were no vitals filed for this visit.  Examination: General: Elderly male, no distress  HENT: Dry MM  Lungs: Clear breath sounds no MRG Cardiovascular: RRR, no MRG Abdomen: Soft, non-tender, active bowel sounds  Extremities: +2 BLE edema, redness and warmth noted  Neuro: lethargic, does follow commands, pupils intact, weakness noted throughout  GU: intact   Resolved Hospital Problem list    Assessment & Plan:   Encephalopathy, dysarthria secondary sepsis? Vs SDH acute vs subacute  H/O Dementia, Parkinson's Schizophrenia  Plan -Neurosurgery Consulted >> non-operative at this point  -Repeat Head CT at 2 pm  -Frequent Neuro checks   Hypotension in setting of septic vs hypovolemic shock (in setting of acute blood loss anemia) H/O HTN Plan -Cardiac Monitoring  -Titrate Levophed to maintain MAP >65   Recent DVT (nonocclusive) to right posterior tibial vein and superficial thrombophlebitis involving the distal great saphenous vein Plan -Hold anticoagulation due to SDH  -?need for IVC filter    Hypothermia, CXR with subtle opacity in the right upper lobe concerning for CAP Plan -Trend WBC and Temp Curve -Follow Culture Data -Send Strep P and Urine Legion -Trend PCT (first <0.10)  -Continue Cefepime/Vanomycin for now   Hypoglycemia  Plan -Trend glucose -  Start D5LR gtt  -PRN Dextrose  Acute on Chronic Anemia  SupraTherapeutic INR  Plan  -S/P Vit K  -Trend INR  -Trend CBC  -Transfuse for hemoglobin <7  -Hold all anticoagulation   Best  practice:  Diet: NPO DVT prophylaxis: Hold  GI prophylaxis: PPI Glucose control: Glucose  Mobility: Bedrest  Code Status: FC  Family Communication: no family noted  Disposition: Continue ICU Care   Labs   CBC: Recent Labs  Lab 03/08/19 1224  WBC 4.6  NEUTROABS 3.5  HGB 5.5*  HCT 20.2*  MCV 93.5  PLT 017    Basic Metabolic Panel: Recent Labs  Lab 03/08/19 1224  NA 141  K 4.1  CL 104  CO2 31  GLUCOSE 90  BUN 31*  CREATININE 1.66*  CALCIUM 8.6*   GFR: CrCl cannot be calculated (Unknown ideal weight.). Recent Labs  Lab 03/08/19 1149 03/08/19 1224 03/08/19 1322  PROCALCITON  --  <0.10  --   WBC  --  4.6  --   LATICACIDVEN 1.2  --  1.1    Liver Function Tests: Recent Labs  Lab 03/08/19 1224  AST 31  ALT 7  ALKPHOS 90  BILITOT 1.2  PROT 6.2*  ALBUMIN 2.8*   No results for input(s): LIPASE, AMYLASE in the last 168 hours. No results for input(s): AMMONIA in the last 168 hours.  ABG No results found for: PHART, PCO2ART, PO2ART, HCO3, TCO2, ACIDBASEDEF, O2SAT   Coagulation Profile: Recent Labs  Lab 03/08/19 1224  INR 3.2*    Cardiac Enzymes: No results for input(s): CKTOTAL, CKMB, CKMBINDEX, TROPONINI in the last 168 hours.  HbA1C: No results found for: HGBA1C  CBG: Recent Labs  Lab 03/08/19 1451 03/08/19 1611 03/08/19 1718 03/08/19 1936 03/08/19 2140  GLUCAP 76 58* 101* 52* 103*    Review of Systems:   Unable to review   Past Medical History  He,  has a past medical history of Cellulitis, Dementia ([place]), Edema, Hypertension, Hypoglycemia, Mental retardation, Parkinson's disease ([place]), Psychosis ([place]), and Renal insufficiency.   Surgical History    Past Surgical History:  Procedure Laterality Date  . COLONOSCOPY N/A 03/12/2015   Procedure: COLONOSCOPY;  Surgeon: Danie Binder, MD;  Location: [facility];  Service: Endoscopy;  Laterality: N/A;  1430 - moved to 1/16 @ 1:45 - office to notify     Social History   reports that  he has been smoking. He has never used smokeless tobacco. He reports that he does not drink alcohol or use drugs.   Family History   His family history includes Colon cancer in an other family member.   Allergies Allergies  Allergen Reactions  . Penicillins     .Did it involve swelling of the face/tongue/throat, SOB, or low BP? Unknown Did it involve sudden or severe rash/hives, skin peeling, or any reaction on the inside of your mouth or nose? Unknown Did you need to seek medical attention at a hospital or doctor's office? Unknown When did it last happen?Unknown If all above answers are "NO", may proceed with cephalosporin use.      Home Medications  Prior to Admission medications   Medication Sig Start Date End Date Taking? Authorizing Provider  acetaminophen (Q-PAP) 500 MG tablet Take 1 tablet (500 mg total) by mouth every 6 (six) hours as needed for mild pain or fever. 12/03/18  Yes Emokpae, Courage, MD  amantadine (SYMMETREL) 100 MG capsule Take 100 mg by mouth 2 (two) times daily.   Yes  [provider]  aspirin EC 81 MG tablet Take 1 tablet (81 mg total) by mouth daily with breakfast. 02/13/19  Yes Emokpae, Courage, MD  busPIRone (BUSPAR) 15 MG tablet Take 1 tablet (15 mg total) by mouth 2 (two) times daily. 12/03/18  Yes Emokpae, Courage, MD  carbidopa-levodopa (SINEMET IR) 25-100 MG tablet Take 2 tablets by mouth 4 (four) times daily. 12/03/18  Yes Emokpae, Courage, MD  clonazePAM (KLONOPIN) 0.5 MG tablet Take 0.5 tablets (0.25 mg total) by mouth daily as needed for anxiety. 02/13/19  Yes Emokpae, Courage, MD  cloZAPine (CLOZARIL) 50 MG tablet Take 1 tablet (50 mg total) by mouth at bedtime. 12/03/18  Yes Emokpae, Courage, MD  Ensure (ENSURE) Take 237 mLs by mouth 2 (two) times daily. 02/13/19  Yes Emokpae, Courage, MD  magnesium oxide (MAG-OX) 400 MG tablet Take 1 tablet (400 mg total) by mouth 2 (two) times daily. 12/03/18  Yes Emokpae, Courage, MD  nystatin cream  (MYCOSTATIN) Apply 1 application topically 2 (two) times daily. Applied to feet   Yes [provider]  polyethylene glycol powder (GAVILAX) 17 GM/SCOOP powder Take 17 g by mouth 2 (two) times daily. 02/13/19  Yes Roxan Hockey, MD  potassium chloride SA (KLOR-CON) 20 MEQ tablet Take 1 tablet (20 mEq total) by mouth every Monday, Wednesday, and Friday. 02/14/19  Yes Emokpae, Courage, MD  psyllium (REGULOID) 0.52 g capsule Take 1.04 g by mouth every morning.   Yes [provider]  rasagiline (AZILECT) 1 MG TABS tablet Take 1 tablet (1 mg total) by mouth daily. 12/03/18  Yes Roxan Hockey, MD  torsemide (DEMADEX) 20 MG tablet Take 1 tablet (20 mg total) by mouth every Monday, Wednesday, and Friday. 02/14/19  Yes Roxan Hockey, MD  warfarin (COUMADIN) 2 MG tablet Take 1 tablet (2 mg total) by mouth every evening. 02/13/19  Yes Emokpae, Courage, MD  donepezil (ARICEPT) 10 MG tablet Take 1 tablet (10 mg total) by mouth daily. Patient not taking: Reported on 03/08/2019 12/03/18   Roxan Hockey, MD  senna-docusate (SENOKOT-S) 8.6-50 MG tablet Take 2 tablets by mouth at bedtime. Patient not taking: Reported on 03/08/2019 02/13/19   Roxan Hockey, MD  simvastatin (ZOCOR) 20 MG tablet Take 20 mg by mouth daily.    [provider]  tamsulosin (FLOMAX) 0.4 MG CAPS capsule Take 1 capsule (0.4 mg total) by mouth daily. Patient not taking: Reported on 03/08/2019 12/03/18   Roxan Hockey, MD     Critical care time: 42 minutes     Hayden Pedro, AGACNP-BC [facility]  PCCM Pgr: [phone]

## 2019-03-08 NOTE — ED Triage Notes (Signed)
Ems reports pt is a resident at [facility] care.  Staff told ems pt was normal when he went to bed last night but this morning he was unable to stand or support himself sitting, usually alert, oriented, and can feed himself.  EMS says pt unable to feed self and unable to understand his speech.  EMS noticed pt seemed weaker on his r side when he tries to push himself up in the bed.  EMS says unable to obtain cbg because their machine read error.  EMS started 18g IV in left ac.

## 2019-03-08 NOTE — ED Notes (Signed)
bair hugger applied.

## 2019-03-08 NOTE — ED Provider Notes (Addendum)
[facility] EMERGENCY DEPARTMENT Provider Note   CSN: [id number] Arrival date & time: 03/08/19  1116     History Chief Complaint  Patient presents with  . Altered Mental Status   Level 5 caveat due to altered mental status  [name] is a 69 y.o. male with history of dementia, hypertension, intellectual disability, Parkinson's disease, schizophrenia, DVT currently on warfarin presents brought in by EMS for evaluation of altered mental status.  Per EMS the patient was reportedly at his baseline when he went to bed last night but this morning was unable to stand or support himself sitting and exhibited garbled dysarthric speech.  He is reportedly talkative and can feed himself and at least oriented to person at baseline.  On my assessment the patient attempts to answer questions but exhibits fairly garbled incomprehensible speech.  He is able to follow some commands but not others.  He was found to be hypothermic and hypotensive on arrival and placed on Bair hugger and given warmed IV fluids.  Recent admission for urosepsis on 02/10/2019 and discharged on 02/13/2019.   Spoke with patient's caregiver [name].  She was informed by staff that the patient was behaving abnormally last night, was more agitated and lethargic, confused speech.  He was reportedly reaching out for things that were not there.  She notes that he was able to sleep without difficulty but when he awoke was unable to pull himself out of bed which is unusual for him.  She notes that he can typically ambulate without the aid of any assistive devices but they do use a wheelchair to help transport him around.  She denies any recent trauma or falls but she does note that he is prone to running into walls.  To her knowledge she has not had any significant trauma recently.  She notes that the swelling in his hands and lower extremities has been persistent for the last several months, and is currently at baseline.  She will  contact patient's next of kin to discuss [place].  The history is provided by medical records and the EMS personnel. The history is limited by the condition of the patient.       Past Medical History:  Diagnosis Date  . Cellulitis   . Dementia ([place])   . Edema   . Hypertension   . Hypoglycemia   . Mental retardation   . Parkinson's disease ([place])   . Psychosis ([place])   . Renal insufficiency     Patient Active Problem List   Diagnosis Date Noted  . Sepsis ([place]) 02/10/2019  . Sepsis secondary to UTI ([place]) 11/28/2018  . DVT (deep venous thrombosis) ([place]) 11/28/2018  . AKI (acute kidney injury) ([place]) 11/28/2018  . Cellulitis 10/13/2018  . Schizophrenia ([place]) 10/13/2018  . Parkinson disease ([place]) 10/13/2018  . Dementia ([place]) 10/13/2018  . Encounter for screening colonoscopy 01/30/2015    Past Surgical History:  Procedure Laterality Date  . COLONOSCOPY N/A 03/12/2015   Procedure: COLONOSCOPY;  Surgeon: Danie Binder, MD;  Location: [facility];  Service: Endoscopy;  Laterality: N/A;  1430 - moved to 1/16 @ 1:45 - office to notify       Family History  Problem Relation Age of Onset  . Colon cancer Other        Unknown family history    Social History   Tobacco Use  . Smoking status: Current Every Day Smoker  . Smokeless tobacco: Never Used  Substance Use Topics  .  Alcohol use: No    Alcohol/week: 0.0 standard drinks  . Drug use: No    Home Medications Prior to Admission medications   Medication Sig Start Date End Date Taking? Authorizing Provider  acetaminophen (Q-PAP) 500 MG tablet Take 1 tablet (500 mg total) by mouth every 6 (six) hours as needed for mild pain or fever. 12/03/18  Yes Emokpae, Courage, MD  amantadine (SYMMETREL) 100 MG capsule Take 100 mg by mouth 2 (two) times daily.   Yes [provider]  aspirin EC 81 MG tablet Take 1 tablet (81 mg total) by mouth daily with breakfast. 02/13/19  Yes Emokpae, Courage, MD  busPIRone (BUSPAR) 15  MG tablet Take 1 tablet (15 mg total) by mouth 2 (two) times daily. 12/03/18  Yes Emokpae, Courage, MD  carbidopa-levodopa (SINEMET IR) 25-100 MG tablet Take 2 tablets by mouth 4 (four) times daily. 12/03/18  Yes Emokpae, Courage, MD  clonazePAM (KLONOPIN) 0.5 MG tablet Take 0.5 tablets (0.25 mg total) by mouth daily as needed for anxiety. 02/13/19  Yes Emokpae, Courage, MD  cloZAPine (CLOZARIL) 50 MG tablet Take 1 tablet (50 mg total) by mouth at bedtime. 12/03/18  Yes Emokpae, Courage, MD  Ensure (ENSURE) Take 237 mLs by mouth 2 (two) times daily. 02/13/19  Yes Emokpae, Courage, MD  magnesium oxide (MAG-OX) 400 MG tablet Take 1 tablet (400 mg total) by mouth 2 (two) times daily. 12/03/18  Yes Emokpae, Courage, MD  nystatin cream (MYCOSTATIN) Apply 1 application topically 2 (two) times daily. Applied to feet   Yes [provider]  polyethylene glycol powder (GAVILAX) 17 GM/SCOOP powder Take 17 g by mouth 2 (two) times daily. 02/13/19  Yes Roxan Hockey, MD  potassium chloride SA (KLOR-CON) 20 MEQ tablet Take 1 tablet (20 mEq total) by mouth every Monday, Wednesday, and Friday. 02/14/19  Yes Emokpae, Courage, MD  psyllium (REGULOID) 0.52 g capsule Take 1.04 g by mouth every morning.   Yes [provider]  rasagiline (AZILECT) 1 MG TABS tablet Take 1 tablet (1 mg total) by mouth daily. 12/03/18  Yes Roxan Hockey, MD  torsemide (DEMADEX) 20 MG tablet Take 1 tablet (20 mg total) by mouth every Monday, Wednesday, and Friday. 02/14/19  Yes Roxan Hockey, MD  warfarin (COUMADIN) 2 MG tablet Take 1 tablet (2 mg total) by mouth every evening. 02/13/19  Yes Emokpae, Courage, MD  donepezil (ARICEPT) 10 MG tablet Take 1 tablet (10 mg total) by mouth daily. Patient not taking: Reported on 03/08/2019 12/03/18   Roxan Hockey, MD  senna-docusate (SENOKOT-S) 8.6-50 MG tablet Take 2 tablets by mouth at bedtime. Patient not taking: Reported on 03/08/2019 02/13/19   Roxan Hockey, MD    simvastatin (ZOCOR) 20 MG tablet Take 20 mg by mouth daily.    [provider]  tamsulosin (FLOMAX) 0.4 MG CAPS capsule Take 1 capsule (0.4 mg total) by mouth daily. Patient not taking: Reported on 03/08/2019 12/03/18   Roxan Hockey, MD    Allergies    Penicillins  Review of Systems   Review of Systems  Unable to perform ROS: Mental status change    Physical Exam Updated Vital Signs BP 106/62   Pulse 71   Temp (!) 93.8 F (34.3 C) (Rectal)   Resp 18   SpO2 100%   Physical Exam Vitals and nursing note reviewed.  Constitutional:      General: He is not in acute distress.    Appearance: He is well-developed.  HENT:     Head: Normocephalic  and atraumatic.  Eyes:     General:        Right eye: No discharge.        Left eye: No discharge.     Conjunctiva/sclera: Conjunctivae normal.     Comments: Pupils are pinpoint, minimally reactive to light, patient unable to comply with commands to check EOMs.  Neck:     Vascular: No JVD.     Trachea: No tracheal deviation.  Cardiovascular:     Rate and Rhythm: Regular rhythm. Bradycardia present.     Comments: 2+ pitting edema bilateral lower extremities.  There is pitting edema to the bilateral hands.  2+ radial and DP/PT pulses bilaterally. Pulmonary:     Effort: Pulmonary effort is normal.     Comments: Scattered rhonchi Abdominal:     General: Abdomen is flat. There is no distension.     Palpations: Abdomen is soft.     Tenderness: There is no abdominal tenderness. There is no guarding.  Genitourinary:    Rectum: Guaiac result negative.     Comments: Examination performed in the presence of a chaperone.  No frank rectal bleeding.  No external hemorrhoids.  Few stool balls noted, no melena or hematochezia. Musculoskeletal:     Cervical back: Neck supple.     Right lower leg: Edema present.     Left lower leg: Edema present.  Skin:    General: Skin is warm and dry.     Findings: No erythema.  Neurological:      Mental Status: He is alert.     GCS: GCS eye subscore is 4. GCS verbal subscore is 2. GCS motor subscore is 6.     Comments: Speech is entirely incomprehensible and dysarthric. No facial droop noted however difficult to assess cranial nerves due to inability to cooperate with exam. He is able to follow some commands but not others.  Equal grip strength bilaterally.  He is able to hold up bilateral upper extremities against gravity for 5 seconds without difficulty.  Plantarflexion and dorsiflexion against resistance intact.  Psychiatric:        Behavior: Behavior normal.     ED Results / Procedures / Treatments   Labs (all labs ordered are listed, but only abnormal results are displayed) Labs Reviewed  COMPREHENSIVE METABOLIC PANEL - Abnormal; Notable for the following components:      Result Value   BUN 31 (*)    Creatinine, Ser 1.66 (*)    Calcium 8.6 (*)    Total Protein 6.2 (*)    Albumin 2.8 (*)    GFR calc non Af Amer 41 (*)    GFR calc Af Amer 48 (*)    All other components within normal limits  CBC WITH DIFFERENTIAL/PLATELET - Abnormal; Notable for the following components:   RBC 2.16 (*)    Hemoglobin 5.5 (*)    HCT 20.2 (*)    MCH 25.5 (*)    MCHC 27.2 (*)    RDW 17.1 (*)    nRBC 0.7 (*)    All other components within normal limits  PROTIME-INR - Abnormal; Notable for the following components:   Prothrombin Time 32.6 (*)    INR 3.2 (*)    All other components within normal limits  URINALYSIS, ROUTINE W REFLEX MICROSCOPIC - Abnormal; Notable for the following components:   Hgb urine dipstick SMALL (*)    All other components within normal limits  FOLATE - Abnormal; Notable for the following components:   Folate 4.9 (*)  All other components within normal limits  IRON AND TIBC - Abnormal; Notable for the following components:   TIBC 228 (*)    All other components within normal limits  RETICULOCYTES - Abnormal; Notable for the following components:   Retic Ct  Pct 6.5 (*)    RBC. 1.80 (*)    Immature Retic Fract 31.1 (*)    All other components within normal limits  CBG MONITORING, ED - Abnormal; Notable for the following components:   Glucose-Capillary 58 (*)    All other components within normal limits  CULTURE, BLOOD (ROUTINE X 2)  CULTURE, BLOOD (ROUTINE X 2)  RESPIRATORY PANEL BY RT PCR (FLU A&B, COVID)  MRSA PCR SCREENING  LACTIC ACID, PLASMA  LACTIC ACID, PLASMA  VITAMIN B12  FERRITIN  PROCALCITONIN  CREATININE, SERUM  CBG MONITORING, ED  POC SARS CORONAVIRUS 2 AG -  ED  POC OCCULT BLOOD, ED  CBG MONITORING, ED  PREPARE RBC (CROSSMATCH)  TYPE AND SCREEN  ABO/RH    EKG None  Radiology CT Head Wo Contrast  Result Date: 03/08/2019 CLINICAL DATA:  Altered mental status (AMS), unclear cause. Additional history provided: Patient unable to stand or supportive self this morning, weaker on the right side. EXAM: CT HEAD WITHOUT CONTRAST TECHNIQUE: Contiguous axial images were obtained from the base of the skull through the vertex without intravenous contrast. COMPARISON:  Head CT 02/10/2019 FINDINGS: Brain: There has been interval development of a thin intermediate density subdural hematoma overlying the left cerebral convexity since CT 02/10/2019. This measures up to 9 mm in thickness. Mild mass effect upon the underlying left cerebral hemisphere. 2 mm rightward midline shift at the level of the septum pellucidum. No evidence of intracranial mass. No demarcated cortical infarct. Ill-defined hypoattenuation within the cerebral white matter is nonspecific, but consistent with chronic small vessel ischemic disease. Mild generalized parenchymal atrophy. Vascular: No hyperdense vessel Skull: Normal. Negative for fracture or focal lesion. Sinuses/Orbits: Visualized orbits demonstrate no acute abnormality. Mild ethmoid sinus mucosal thickening. No significant mastoid effusion. These results were called by telephone at the time of interpretation on  03/08/2019 at 2:12 pm to provider Dr. Lacinda Axon, who verbally acknowledged these results. IMPRESSION: Interval development of a thin intermediate density subdural hematoma overlying the left cerebral convexity measuring 9 mm in thickness. Mild mass effect upon the underlying left cerebral hemisphere. 2 mm rightward midline shift. Stable generalized parenchymal atrophy and chronic small vessel ischemic disease. Electronically Signed   By: Kellie Simmering DO   On: 03/08/2019 14:12   CT Cervical Spine Wo Contrast  Result Date: 03/08/2019 CLINICAL DATA:  Status post trauma. EXAM: CT CERVICAL SPINE WITHOUT CONTRAST TECHNIQUE: Multidetector CT imaging of the cervical spine was performed without intravenous contrast. Multiplanar CT image reconstructions were also generated. COMPARISON:  None. FINDINGS: Alignment: Minimal grade 1 retrolisthesis of C4-5 is noted secondary to severe degenerative disc disease. Skull base and vertebrae: No acute fracture. No primary bone lesion or focal pathologic process. Soft tissues and spinal canal: No prevertebral fluid or swelling. No visible canal hematoma. Disc levels: Severe degenerative disc disease is noted at C3-4, C4-5, C5-6 and C6-7. Upper chest: Negative. Other: None. IMPRESSION: Severe multilevel degenerative disc disease. No acute abnormality seen in the cervical spine. Electronically Signed   By: Marijo Conception M.D.   On: 03/08/2019 15:53   DG Chest Portable 1 View  Result Date: 03/08/2019 CLINICAL DATA:  Altered mental status EXAM: PORTABLE CHEST 1 VIEW COMPARISON:  02/11/2019 FINDINGS: The heart  size and mediastinal contours are within normal limits. Subtle asymmetric opacity within the right upper lobe is new from previous exam. No pleural effusion or edema identified the visualized skeletal structures are unremarkable. IMPRESSION: 1. Subtle asymmetric opacity in the right upper lobe is new from previous exam. Cannot rule out early pneumonia. Followup PA and lateral chest  X-ray is recommended in 3-4 weeks following trial of antibiotic therapy to ensure resolution and exclude underlying malignancy. 2. No heart failure. Electronically Signed   By: Kerby Moors M.D.   On: 03/08/2019 11:52    Procedures .Critical Care Performed by: Renita Papa, PA-C Authorized by: Renita Papa, PA-C   Critical care provider statement:    Critical care time (minutes):  50   Critical care was necessary to treat or prevent imminent or life-threatening deterioration of the following conditions:  Sepsis and shock   Critical care was time spent personally by me on the following activities:  Discussions with consultants, evaluation of patient's response to treatment, examination of patient, ordering and performing treatments and interventions, ordering and review of laboratory studies, ordering and review of radiographic studies, pulse oximetry, re-evaluation of patient's condition, obtaining history from patient or surrogate and review of old charts   (including critical care time)  Medications Ordered in ED Medications  0.9 %  sodium chloride infusion (has no administration in time range)  vancomycin (VANCOREADY) IVPB 500 mg/100 mL (has no administration in time range)  vancomycin (VANCOCIN) IVPB 1000 mg/200 mL premix (has no administration in time range)  ceFEPIme (MAXIPIME) 2 g in sodium chloride 0.9 % 100 mL IVPB (has no administration in time range)  0.9 %  sodium chloride infusion (250 mLs Intravenous New Bag/Given 03/08/19 1507)  norepinephrine (LEVOPHED) 4mg  in 25mL premix infusion (5 mcg/min Intravenous Rate/Dose Change 03/08/19 1607)  LORazepam (ATIVAN) injection 1 mg (has no administration in time range)  dextrose 50 % solution 50 mL (has no administration in time range)  sodium chloride 0.9 % bolus 1,000 mL (0 mLs Intravenous Stopped 03/08/19 1305)  vancomycin (VANCOCIN) IVPB 1000 mg/200 mL premix (0 mg Intravenous Stopped 03/08/19 1500)  ceFEPIme (MAXIPIME) 2 g in  sodium chloride 0.9 % 100 mL IVPB (0 g Intravenous Stopped 03/08/19 1330)  azithromycin (ZITHROMAX) 500 mg in sodium chloride 0.9 % 250 mL IVPB (0 mg Intravenous Stopped 03/08/19 1402)  sodium chloride 0.9 % bolus 1,000 mL (1,000 mLs Intravenous New Bag/Given 03/08/19 1315)  LORazepam (ATIVAN) 2 MG/ML injection (0.5 mg  Given 03/08/19 1516)  phytonadione (VITAMIN K) 1 mg in dextrose 5 % 50 mL IVPB (1 mg Intravenous New Bag/Given 03/08/19 1553)  fentaNYL (SUBLIMAZE) 100 MCG/2ML injection (50 mcg  Given 03/08/19 1551)  dextrose 50 % solution 50 mL (50 mLs Intravenous Given 03/08/19 1621)    ED Course  I have reviewed the triage vital signs and the nursing notes.  Pertinent labs & imaging results that were available during my care of the patient were reviewed by me and considered in my medical decision making (see chart for details).    MDM Rules/Calculators/A&P                      Patient presents sent from facility for evaluation of altered mental status since last night.  He is hypothermic and persistently hypotensive in the ED.  He exhibits incomprehensible speech, is able to follow some commands but not others.  Placed on [facility] and code sepsis was initiated.  He received  30 cc/kg bolus of normal saline with no improvement in blood pressure, MAP maintaining around 50.  He was started on norepinephrine through peripheral access with some improvement in blood pressure.   Lab work reviewed by me shows no leukocytosis, anemia with hemoglobin of 5.5.  His baseline is around 7-8.  Point of care Hemoccult is negative; anemia panel obtained.  Will transfuse 2 units PRBCs.  Remainder of lab work reviewed by me shows elevation in BUN and creatinine, likely in the setting of dehydration.  Albumin is low.  Suspect that peripheral edema is more indicative of third spacing than volume overload.  UA is not concerning for UTI or nephrolithiasis, will culture.  Chest x-ray shows subtle asymmetric opacity in  the right upper lobe which is new from previous exam concerning for possible pneumonia.  He was started on cefepime, vancomycin, and azithromycin in the ED.  Covid test is negative.  Due to altered mental status head and neck CTs were obtained which show interval development of a subdural hematoma 9 mm in thickness with mass-effect upon the underlying left cerebral hemisphere and 2 mm rightward midline shift.  Neurosurgery was consulted and Dr. Lacinda Axon spoke with Dr. Zada Finders who reviewed the patient's images independently and notes that the blood appears chronic and there is no indication for emergent neurosurgical intervention.  He does recommend discontinuing the patient's warfarin and if needed giving subcu heparin for anticoagulation purposes.  However the patient's INR is supratherapeutic today at 3.2.  We will give vitamin K to reverse this. No acute cervical spine injury on imaging.  Per caregiver, no history of trauma.  While in the ED the patient became more agitated and required soft restraints of the upper extremities, Ativan and 1 dose of fentanyl with improvement.  He can still follow some commands.  Speech remains incomprehensible.  Spoke with Dr. Posey Pronto with [facility] hospitalist service.  He is concerned that the patient may require transfer to [facility] for his ICU level care if he is to require formal neurosurgical evaluation.  Spoke with Dr. Vaughan Browner with ICU at [facility].  He agrees to assume care of patient however the unit is full and he is concerned about the ability to transfer the patient expeditiously given no bed availability.  CONSULT: I spoke with Dr. Zada Finders with neurosurgery who advised that the patient will not require formal neurosurgical consultation given his subdural hematoma appears chronic.  He feels it is reasonable to keep the patient at [facility] for further evaluation and management at this time.  Dr. Laverta Baltimore to consult hospitalist service for further  recommendations.  Final Clinical Impression(s) / ED Diagnoses Final diagnoses:  Sepsis with encephalopathy and septic shock, due to unspecified organism ([place])  Subdural hematoma (H[facility])  Anemia, unspecified type  Hypothermia, initial encounter    Rx / DC Orders ED Discharge Orders    None       Debroah Baller 03/08/19 1735    Nat Christen, MD 03/09/19 [phone]

## 2019-03-08 NOTE — Progress Notes (Signed)
Pharmacy Antibiotic Note  [name] is a 69 y.o. male admitted on 03/08/2019 with pneumonia.  Pharmacy has been consulted for Vancomycin and Cefepime dosing.  Plan: Vancomycin 1500 mg IV x 1 dose. Vancomycin 1000 mg IV every 24 hours.  Goal trough 15-20 mcg/mL.  Cefepime 2000 mg IV every 12 hours. Monitor labs, c/s, and vanco level as indicated.     Temp (24hrs), Avg:90 F (32.2 C), Min:90 F (32.2 C), Max:90 F (32.2 C)  Recent Labs  Lab 03/08/19 1149 03/08/19 1224 03/08/19 1322  WBC  --  4.6  --   CREATININE  --  1.66*  --   LATICACIDVEN 1.2  --  1.1    CrCl cannot be calculated (Unknown ideal weight.).    Allergies  Allergen Reactions  . Penicillins     .Did it involve swelling of the face/tongue/throat, SOB, or low BP? Unknown Did it involve sudden or severe rash/hives, skin peeling, or any reaction on the inside of your mouth or nose? Unknown Did you need to seek medical attention at a hospital or doctor's office? Unknown When did it last happen?Unknown If all above answers are "NO", may proceed with cephalosporin use.     Antimicrobials this admission: Cefepime 1/12 >>  Vanco 1/12 >>   Dose adjustments this admission: Vanco/Cefepime  Microbiology results: 1/12 BCx: pending 1/12 MRSA PCR: pending  Thank you for allowing pharmacy to be a part of this patient's care.  Ramond Craver 03/08/2019 2:38 PM

## 2019-03-08 NOTE — Progress Notes (Signed)
Discussed with patient's treatment team regarding history, current neurological status, and reviewed his CT head without contrast. He has a history of PD, MR/DD, schizophrenia, DVT on warfarin, recent admission for sepsis, found down dysarthric with hypotension, hypothermia, reportedly normal last night. Now with septic shock picture, CTH obtained due to AMS, I reviewed the CT, which shows an intermediate density L SDH. It measures roughly 84mm in thickness and I do not see a fluid level consistent with a hyperacute or uncoagulated collection, which again is consistent with his history and lack of lateralizing signs. While his Hb is 5.5 and INR of 3.2, given the history and imaging findings, this most likely represents a subacute SDH, meaning it is between 75-60 days old and does not correlate well with the patient's history. I therefore do not think that he needs surgery to remove this collection at this time, as it has likely been present and asymptomatic for longer than the duration of his dysarthria, which is a common finding in encephalopathy 2/2 sepsis and not a lateralizing supratentorial symptom.  From a neurosurgical perspective, given his recent DVT, I would recommend holding his warfarin unless he needs active reversal for another reason besides his subacute subdural. When no longer therapeutic, I think the benefits outweigh the risks for prophylactic, but not therapeutic, anticoagulation for a DVT without symptomatic PE. I am available for any concerns or questions.  Following his hospitalization, he should follow up with me in clinic to monitor his subdural collection, given he will potentially have to eventually resume his anticoagulation.  Judith Part, MD

## 2019-03-08 NOTE — Sepsis Progress Note (Signed)
Notified provider of need to order fluid bolus.  ?

## 2019-03-09 ENCOUNTER — Other Ambulatory Visit: Payer: Self-pay

## 2019-03-09 ENCOUNTER — Inpatient Hospital Stay (HOSPITAL_COMMUNITY): Payer: Medicare HMO

## 2019-03-09 DIAGNOSIS — L8990 Pressure ulcer of unspecified site, unspecified stage: Secondary | ICD-10-CM | POA: Insufficient documentation

## 2019-03-09 DIAGNOSIS — S065XAA Traumatic subdural hemorrhage with loss of consciousness status unknown, initial encounter: Secondary | ICD-10-CM

## 2019-03-09 DIAGNOSIS — S065X9A Traumatic subdural hemorrhage with loss of consciousness of unspecified duration, initial encounter: Secondary | ICD-10-CM

## 2019-03-09 DIAGNOSIS — D62 Acute posthemorrhagic anemia: Secondary | ICD-10-CM

## 2019-03-09 DIAGNOSIS — G9340 Encephalopathy, unspecified: Secondary | ICD-10-CM | POA: Diagnosis present

## 2019-03-09 DIAGNOSIS — A419 Sepsis, unspecified organism: Principal | ICD-10-CM

## 2019-03-09 DIAGNOSIS — R6521 Severe sepsis with septic shock: Secondary | ICD-10-CM

## 2019-03-09 LAB — CBC
HCT: 29.6 % — ABNORMAL LOW (ref 39.0–52.0)
HCT: 27.8 % — ABNORMAL LOW (ref 39.0–52.0)
HCT: 28.6 % — ABNORMAL LOW (ref 39.0–52.0)
HCT: 31.8 % — ABNORMAL LOW (ref 39.0–52.0)
Hemoglobin: 8.7 g/dL — ABNORMAL LOW (ref 13.0–17.0)
Hemoglobin: 8.4 g/dL — ABNORMAL LOW (ref 13.0–17.0)
Hemoglobin: 8.5 g/dL — ABNORMAL LOW (ref 13.0–17.0)
Hemoglobin: 9.6 g/dL — ABNORMAL LOW (ref 13.0–17.0)
MCH: 27 pg (ref 26.0–34.0)
MCH: 26.8 pg (ref 26.0–34.0)
MCH: 26.7 pg (ref 26.0–34.0)
MCH: 27.2 pg (ref 26.0–34.0)
MCHC: 29.4 g/dL — ABNORMAL LOW (ref 30.0–36.0)
MCHC: 30.2 g/dL (ref 30.0–36.0)
MCHC: 29.7 g/dL — ABNORMAL LOW (ref 30.0–36.0)
MCHC: 30.2 g/dL (ref 30.0–36.0)
MCV: 91.9 fL (ref 80.0–100.0)
MCV: 88.8 fL (ref 80.0–100.0)
MCV: 89.9 fL (ref 80.0–100.0)
MCV: 90.1 fL (ref 80.0–100.0)
Platelets: 368 10*3/uL (ref 150–400)
Platelets: 366 10*3/uL (ref 150–400)
Platelets: 335 10*3/uL (ref 150–400)
Platelets: 326 10*3/uL (ref 150–400)
RBC: 3.22 MIL/uL — ABNORMAL LOW (ref 4.22–5.81)
RBC: 3.13 MIL/uL — ABNORMAL LOW (ref 4.22–5.81)
RBC: 3.18 MIL/uL — ABNORMAL LOW (ref 4.22–5.81)
RBC: 3.53 MIL/uL — ABNORMAL LOW (ref 4.22–5.81)
RDW: 17.1 % — ABNORMAL HIGH (ref 11.5–15.5)
RDW: 17.2 % — ABNORMAL HIGH (ref 11.5–15.5)
RDW: 17.2 % — ABNORMAL HIGH (ref 11.5–15.5)
RDW: 17.6 % — ABNORMAL HIGH (ref 11.5–15.5)
WBC: 8 10*3/uL (ref 4.0–10.5)
WBC: 10.1 10*3/uL (ref 4.0–10.5)
WBC: 10.1 10*3/uL (ref 4.0–10.5)
WBC: 12.2 10*3/uL — ABNORMAL HIGH (ref 4.0–10.5)
nRBC: 1 % — ABNORMAL HIGH (ref 0.0–0.2)
nRBC: 0.7 % — ABNORMAL HIGH (ref 0.0–0.2)
nRBC: 0.5 % — ABNORMAL HIGH (ref 0.0–0.2)
nRBC: 0.5 % — ABNORMAL HIGH (ref 0.0–0.2)

## 2019-03-09 LAB — GLUCOSE, CAPILLARY
Glucose-Capillary: 67 mg/dL — ABNORMAL LOW (ref 70–99)
Glucose-Capillary: 70 mg/dL (ref 70–99)
Glucose-Capillary: 91 mg/dL (ref 70–99)
Glucose-Capillary: 61 mg/dL — ABNORMAL LOW (ref 70–99)
Glucose-Capillary: 115 mg/dL — ABNORMAL HIGH (ref 70–99)
Glucose-Capillary: 68 mg/dL — ABNORMAL LOW (ref 70–99)
Glucose-Capillary: 78 mg/dL (ref 70–99)

## 2019-03-09 LAB — POCT I-STAT 7, (LYTES, BLD GAS, ICA,H+H)
Acid-Base Excess: 3 mmol/L — ABNORMAL HIGH (ref 0.0–2.0)
Bicarbonate: 29 mmol/L — ABNORMAL HIGH (ref 20.0–28.0)
Calcium, Ion: 1.2 mmol/L (ref 1.15–1.40)
HCT: 26 % — ABNORMAL LOW (ref 39.0–52.0)
Hemoglobin: 8.8 g/dL — ABNORMAL LOW (ref 13.0–17.0)
O2 Saturation: 93 %
Patient temperature: 98
Potassium: 4.2 mmol/L (ref 3.5–5.1)
Sodium: 145 mmol/L (ref 135–145)
TCO2: 31 mmol/L (ref 22–32)
pCO2 arterial: 50 mmHg — ABNORMAL HIGH (ref 32.0–48.0)
pH, Arterial: 7.37 (ref 7.350–7.450)
pO2, Arterial: 67 mmHg — ABNORMAL LOW (ref 83.0–108.0)

## 2019-03-09 LAB — TYPE AND SCREEN
ABO/RH(D): A POS
ABO/RH(D): A POS
Antibody Screen: NEGATIVE
Antibody Screen: NEGATIVE
Unit division: 0
Unit division: 0

## 2019-03-09 LAB — BASIC METABOLIC PANEL
Anion gap: 5 (ref 5–15)
Anion gap: 8 (ref 5–15)
BUN: 23 mg/dL (ref 8–23)
BUN: 21 mg/dL (ref 8–23)
CO2: 27 mmol/L (ref 22–32)
CO2: 27 mmol/L (ref 22–32)
Calcium: 8 mg/dL — ABNORMAL LOW (ref 8.9–10.3)
Calcium: 8.4 mg/dL — ABNORMAL LOW (ref 8.9–10.3)
Chloride: 110 mmol/L (ref 98–111)
Chloride: 109 mmol/L (ref 98–111)
Creatinine, Ser: 1.62 mg/dL — ABNORMAL HIGH (ref 0.61–1.24)
Creatinine, Ser: 1.53 mg/dL — ABNORMAL HIGH (ref 0.61–1.24)
GFR calc Af Amer: 49 mL/min — ABNORMAL LOW (ref >60)
GFR calc Af Amer: 53 mL/min — ABNORMAL LOW (ref >60)
GFR calc non Af Amer: 43 mL/min — ABNORMAL LOW (ref >60)
GFR calc non Af Amer: 46 mL/min — ABNORMAL LOW (ref >60)
Glucose, Bld: 83 mg/dL (ref 70–99)
Glucose, Bld: 119 mg/dL — ABNORMAL HIGH (ref 70–99)
Potassium: 4.4 mmol/L (ref 3.5–5.1)
Potassium: 4.3 mmol/L (ref 3.5–5.1)
Sodium: 142 mmol/L (ref 135–145)
Sodium: 144 mmol/L (ref 135–145)

## 2019-03-09 LAB — ABO/RH: ABO/RH(D): A POS

## 2019-03-09 LAB — MRSA PCR SCREENING
MRSA by PCR: NEGATIVE
MRSA by PCR: NEGATIVE

## 2019-03-09 LAB — BPAM RBC
Blood Product Expiration Date: 202101172359
Blood Product Expiration Date: 202101172359
ISSUE DATE / TIME: 202101121652
ISSUE DATE / TIME: 202101122116
Unit Type and Rh: 600
Unit Type and Rh: 600

## 2019-03-09 LAB — PROTIME-INR
INR: 2.7 — ABNORMAL HIGH (ref 0.8–1.2)
Prothrombin Time: 29 seconds — ABNORMAL HIGH (ref 11.4–15.2)

## 2019-03-09 LAB — MAGNESIUM: Magnesium: 2.8 mg/dL — ABNORMAL HIGH (ref 1.7–2.4)

## 2019-03-09 LAB — PHOSPHORUS: Phosphorus: 3.1 mg/dL (ref 2.5–4.6)

## 2019-03-09 LAB — AMMONIA: Ammonia: 38 umol/L — ABNORMAL HIGH (ref 9–35)

## 2019-03-09 LAB — STREP PNEUMONIAE URINARY ANTIGEN: Strep Pneumo Urinary Antigen: NEGATIVE

## 2019-03-09 MED ORDER — AMANTADINE HCL 100 MG PO CAPS
100.0000 mg | ORAL_CAPSULE | Freq: Two times a day (BID) | ORAL | Status: DC
  Administered 2019-03-11 – 2019-03-18 (×15): 100 mg via ORAL
  Filled 2019-03-09 (×21): qty 1

## 2019-03-09 MED ORDER — CARBIDOPA-LEVODOPA 25-100 MG PO TABS
2.0000 | ORAL_TABLET | Freq: Four times a day (QID) | ORAL | Status: DC
  Administered 2019-03-11 – 2019-03-18 (×32): 2 via ORAL
  Filled 2019-03-09 (×42): qty 2

## 2019-03-09 MED ORDER — CHLORHEXIDINE GLUCONATE CLOTH 2 % EX PADS
6.0000 | MEDICATED_PAD | Freq: Every day | CUTANEOUS | Status: DC
  Administered 2019-03-09 – 2019-03-10 (×2): 6 via TOPICAL

## 2019-03-09 MED ORDER — PANTOPRAZOLE SODIUM 40 MG IV SOLR
40.0000 mg | INTRAVENOUS | Status: DC
  Administered 2019-03-09 – 2019-03-10 (×2): 40 mg via INTRAVENOUS
  Filled 2019-03-09 (×2): qty 40

## 2019-03-09 MED ORDER — DEXTROSE 50 % IV SOLN
25.0000 mL | Freq: Once | INTRAVENOUS | Status: AC
  Administered 2019-03-09: 25 mL via INTRAVENOUS
  Filled 2019-03-09: qty 50

## 2019-03-09 NOTE — Progress Notes (Signed)
Hypoglycemic Event  CBG: notified by NA - 61 @ 2022, recheck by RN 68  Treatment: D50 25 mL (12.5 gm) IV  Symptoms: Sweaty, Shaky and Nervous/irritable  Follow-up CBG: Time: 2102    CBG Result: 115. Sierra Brooks RN Notified  Possible Reasons for Event: Other: NPO, on D5%  LR @ 75 mL/hr  Comments: RN @ [place] notified 2052. Will continue to monitor CBG.     Sharene Skeans

## 2019-03-09 NOTE — Progress Notes (Signed)
And was admitted from [facility] for altered mental status, septic shock without significant infectious sources identified, was never on pressors and blood pressure improved, also has chronic subdural hematoma no indication for intervention as per neurosurgery, neurosurgery recommend continue to hold systemic anticoagulation (was for a distal DVT)

## 2019-03-09 NOTE — Plan of Care (Signed)

## 2019-03-09 NOTE — Progress Notes (Signed)
[facility] Progress Note Patient Name: [name] DOB: [birth year] MRN: [id number]   Date of Service  03/09/2019  HPI/Events of Note  Pt  Transferred from [facility] due to altered mental status and a subdural hematoma, Pt was found down at a group home with hypothermia and bradycardia. Temp has normalized with a warming blanket.  eICU Interventions  New patient evaluation completed. PCCM on the ground requested to see the patient.        Kerry Kass Teresa Nicodemus 03/09/2019, 2:56 AM

## 2019-03-09 NOTE — Progress Notes (Addendum)
NAME:  [name], MRN:  [id number], DOB:  09-Oct-1950, LOS: 1 ADMISSION DATE:  03/08/2019, CONSULTATION DATE:  03/08/2019 REFERRING MD:  Nils Flack, MD, CHIEF COMPLAINT:  Septic Shock  (hypothermia/bradycardia)  History of present illness   69 year old male presents to ED 03/08/2019 from SNF with altered mental status, hypothermia, and hypotension. Reportedly patient was at baseline mentation last night, this morning he was noted to have garbled dysarthric speech. On arrival to ED BP 86/40. Temp 90. Hemoglobin 5.5. CXR with opacity to right upper lobe. Given Cefepime/Azithromycin/Vancomycin. COVID negative. Transfused 2 units RBC. Due to continued hypotension placed on levophed. CT Head with SDH 9 mm with mild mass effect of left cerebral hemisphere. Neurosurgery Consulted. Reviewed imaging. Believes its subacute. Recommended continuing to hold anticoagulation. Critical Care consulted for admission due to shock.   Past Medical History  Dementia, Intellectual Disability, Parkinson's, Schizophrenia, DVT 10/2018 on Warfarin   Significant Hospital Events   1/12 > Presents to [facility]  1/13 > Transferred to [facility]:  Neurosurgery  PCCM  Procedures:  N/A   Significant Diagnostic Tests:  CXR 1/12 > 1. Subtle asymmetric opacity in the right upper lobe is new from previous exam. Cannot rule out early pneumonia. Followup PA and lateral chest X-ray is recommended in 3-4 weeks following trial of antibiotic therapy to ensure resolution and exclude underlying Malignancy. 2. No heart failure.  CT Head/C-Spine 1/12 > Interval development of a thin intermediate density subdural hematoma overlying the left cerebral convexity measuring 9 mm in thickness. Mild mass effect upon the underlying left cerebral hemisphere. 2 mm rightward midline shift. Stable generalized parenchymal atrophy and chronic small vessel ischemic disease. Severe multilevel degenerative disc disease. No acute abnormality seen  in the cervical spine.  Micro Data:  Blood 1/12 >> U/A 1/12  > Negative  MRSA PCR > Negative  Antimicrobials:  Cefepime 1/13 >> Vancomycin 1/13 >> Azithromycin ED 1/12  Interim history/subjective:    Objective   Blood pressure 110/71, pulse (!) 56, temperature 98 F (36.7 C), temperature source Oral, resp. rate 14, weight 71.1 kg, SpO2 96 %.        Intake/Output Summary (Last 24 hours) at 03/09/2019 0705 Last data filed at 03/09/2019 0600 Gross per 24 hour  Intake 871.79 ml  Output --  Net 871.79 ml   Filed Weights   03/09/19 0254  Weight: 71.1 kg   Examination: General: Opens left eye to command, moves extremities on command but poorly, in no acute distress, afebrile, nondiaphoretic Neuro: Patient lethargic, does follow commands after repetition, all four extremities move spontaneously, reflexes appear minimally diminished bilaterally upper and lower  HENT: No trauma or rash Eyes: Pupil 86mm with minimal reactivity bilaterally, right eyelid droop prominent, patient does not open this eye Cardio: RRR, no mrg's  Pulmonary: CTA bilaterally, no wheezing or crackles  Abdomen: Bowel sounds normal, soft, nontender  MSK: BLE nontender, nonedematous Psych: Unable to assess, patient does not engage  Resolved Hospital Problem list    Assessment & Plan:   Encephalopathy, dysarthria secondary sepsis Vs subacute SDH  H/O Parkinson's Dementia Neurosurgery indicated that the SDH appears subacute 90-63 days old and is likely not what induced his symptoms. Symptom course does seem to match focal signs as noted previously and again today with decreased movement of the extremities on the right, right eye droop and history with acute onset of deficits. Procalitonin low at <0.10 decreasing likelihood of severe pulmonary PNA. Source of possible sepsis undetermined.  There is a subtle opacity of the right upper lobe that was not seen on prior DG chest. UA unremarkable.  Plan -Neurosurgery  Consulted >> non-operative at this point  -Repeat Head CT at 2 pm ordered -Frequent Neuro checks   Hypotension in setting of septic vs hypovolemic shock (in setting of acute blood loss anemia) Resolved. Etiology uncertain. Could be due to anemia with secondarily associated PNA. Off of levophed now since admission to [facility]. Will repeat Procalcitonin tomorrow.  Plan -Cardiac Monitoring during admission -Trend CBC daily - Procal ordered  Recent DVT (nonocclusive) to right posterior tibial vein and superficial thrombophlebitis involving the distal great saphenous vein Given the distal nature of the DVT I feel that holding anticoagulation in the setting of a SDH is most efficacious. No current indicated for emergent evaluation for IVC placement.  Plan -Hold anticoagulation due to SDH    Hypothermia, CXR with subtle opacity in the right upper lobe concerning for CAP Felt to be due to shock. Plan -Trend WBC and Temp Curve -Follow-up Culture Data -Sent Strep P and Urine Legion -Trend PCT (first <0.10)  -Continue Cefepime -Discontinue Vanomycin today   Hypoglycemia  On admission. Likely due to hypothermia and less so to decreased PO intake.  Plan -Trend glucose -Continue D5LR gtt  -PRN Dextrose  Acute on Chronic Anemia  SupraTherapeutic INR  Contributing to his hypovolemic shock. Holding Anticoagulation for now due to SDH. CBC stable today s/p transfusion.  Plan  -S/P Vit K  -Trend INR  -Trend CBC  -Transfuse for hemoglobin <7  - Hold all anticoagulation   Best practice:  Diet: NPO DVT prophylaxis: SCD's ordered GI prophylaxis: PPI Glucose control: Glucose  Mobility: Bedrest  Code Status: FC  Family Communication: no family noted  Disposition: Continue ICU Care   Labs   CBC: Recent Labs  Lab 03/08/19 1224 03/09/19 0038 03/09/19 0439 03/09/19 0514  WBC 4.6 8.0  --  10.1  NEUTROABS 3.5  --   --   --   HGB 5.5* 8.7* 8.8* 8.4*  HCT 20.2* 29.6* 26.0* 27.8*  MCV  93.5 91.9  --  88.8  PLT 310 368  --  073    Basic Metabolic Panel: Recent Labs  Lab 03/08/19 1224 03/09/19 0038 03/09/19 0439 03/09/19 0514  NA 141 142 145 144  K 4.1 4.4 4.2 4.3  CL 104 110  --  109  CO2 31 27  --  27  GLUCOSE 90 83  --  119*  BUN 31* 23  --  21  CREATININE 1.66* 1.62*  --  1.53*  CALCIUM 8.6* 8.0*  --  8.4*  MG  --   --   --  2.8*  PHOS  --   --   --  3.1   GFR: Estimated Creatinine Clearance: 45.8 mL/min (A) (by C-G formula based on SCr of 1.53 mg/dL (H)). Recent Labs  Lab 03/08/19 1149 03/08/19 1224 03/08/19 1322 03/09/19 0038 03/09/19 0514  PROCALCITON  --  <0.10  --   --   --   WBC  --  4.6  --  8.0 10.1  LATICACIDVEN 1.2  --  1.1  --   --     Liver Function Tests: Recent Labs  Lab 03/08/19 1224  AST 31  ALT 7  ALKPHOS 90  BILITOT 1.2  PROT 6.2*  ALBUMIN 2.8*   No results for input(s): LIPASE, AMYLASE in the last 168 hours. Recent Labs  Lab 03/09/19 0514  AMMONIA 38*  ABG    Component Value Date/Time   PHART 7.370 03/09/2019 0439   PCO2ART 50.0 (H) 03/09/2019 0439   PO2ART 67.0 (L) 03/09/2019 0439   HCO3 29.0 (H) 03/09/2019 0439   TCO2 31 03/09/2019 0439   O2SAT 93.0 03/09/2019 0439     Coagulation Profile: Recent Labs  Lab 03/08/19 1224 03/09/19 0514  INR 3.2* 2.7*    Cardiac Enzymes: No results for input(s): CKTOTAL, CKMB, CKMBINDEX, TROPONINI in the last 168 hours.  HbA1C: No results found for: HGBA1C  CBG: Recent Labs  Lab 03/08/19 1718 03/08/19 1936 03/08/19 2140 03/08/19 2351 03/09/19 0352  GLUCAP 101* 52* 103* 68* 67*    Review of Systems:   Patient unable to provide due to poor mentation.   Past Medical History  He,  has a past medical history of Cellulitis, Dementia ([place]), Edema, Hypertension, Hypoglycemia, Mental retardation, Parkinson's disease ([place]), Psychosis ([place]), and Renal insufficiency.   Surgical History    Past Surgical History:  Procedure Laterality Date  . COLONOSCOPY N/A  03/12/2015   Procedure: COLONOSCOPY;  Surgeon: Danie Binder, MD;  Location[facility];  Service: Endoscopy;  Laterality: N/A;  1430 - moved to 1/16 @ 1:45 - office to notify     Social History   reports that he has been smoking. He has never used smokeless tobacco. He reports that he does not drink alcohol or use drugs.   Family History   His family history includes Colon cancer in an other family member.   Allergies Allergies  Allergen Reactions  . Penicillins     .Did it involve swelling of the face/tongue/throat, SOB, or low BP? Unknown Did it involve sudden or severe rash/hives, skin peeling, or any reaction on the inside of your mouth or nose? Unknown Did you need to seek medical attention at a hospital or doctor's office? Unknown When did it last happen?Unknown If all above answers are "NO", may proceed with cephalosporin use.      Home Medications  Prior to Admission medications   Medication Sig Start Date End Date Taking? Authorizing Provider  acetaminophen (Q-PAP) 500 MG tablet Take 1 tablet (500 mg total) by mouth every 6 (six) hours as needed for mild pain or fever. 12/03/18  Yes Emokpae, Courage, MD  amantadine (SYMMETREL) 100 MG capsule Take 100 mg by mouth 2 (two) times daily.   Yes [provider]  aspirin EC 81 MG tablet Take 1 tablet (81 mg total) by mouth daily with breakfast. 02/13/19  Yes Emokpae, Courage, MD  busPIRone (BUSPAR) 15 MG tablet Take 1 tablet (15 mg total) by mouth 2 (two) times daily. 12/03/18  Yes Emokpae, Courage, MD  carbidopa-levodopa (SINEMET IR) 25-100 MG tablet Take 2 tablets by mouth 4 (four) times daily. 12/03/18  Yes Emokpae, Courage, MD  clonazePAM (KLONOPIN) 0.5 MG tablet Take 0.5 tablets (0.25 mg total) by mouth daily as needed for anxiety. 02/13/19  Yes Emokpae, Courage, MD  cloZAPine (CLOZARIL) 50 MG tablet Take 1 tablet (50 mg total) by mouth at bedtime. 12/03/18  Yes Emokpae, Courage, MD  Ensure (ENSURE) Take 237 mLs  by mouth 2 (two) times daily. 02/13/19  Yes Emokpae, Courage, MD  magnesium oxide (MAG-OX) 400 MG tablet Take 1 tablet (400 mg total) by mouth 2 (two) times daily. 12/03/18  Yes Emokpae, Courage, MD  nystatin cream (MYCOSTATIN) Apply 1 application topically 2 (two) times daily. Applied to feet   Yes [provider]  polyethylene glycol powder (GAVILAX) 17 GM/SCOOP powder  Take 17 g by mouth 2 (two) times daily. 02/13/19  Yes Roxan Hockey, MD  potassium chloride SA (KLOR-CON) 20 MEQ tablet Take 1 tablet (20 mEq total) by mouth every Monday, Wednesday, and Friday. 02/14/19  Yes Emokpae, Courage, MD  psyllium (REGULOID) 0.52 g capsule Take 1.04 g by mouth every morning.   Yes [provider]  rasagiline (AZILECT) 1 MG TABS tablet Take 1 tablet (1 mg total) by mouth daily. 12/03/18  Yes Roxan Hockey, MD  torsemide (DEMADEX) 20 MG tablet Take 1 tablet (20 mg total) by mouth every Monday, Wednesday, and Friday. 02/14/19  Yes Roxan Hockey, MD  warfarin (COUMADIN) 2 MG tablet Take 1 tablet (2 mg total) by mouth every evening. 02/13/19  Yes Emokpae, Courage, MD  donepezil (ARICEPT) 10 MG tablet Take 1 tablet (10 mg total) by mouth daily. Patient not taking: Reported on 03/08/2019 12/03/18   Roxan Hockey, MD  senna-docusate (SENOKOT-S) 8.6-50 MG tablet Take 2 tablets by mouth at bedtime. Patient not taking: Reported on 03/08/2019 02/13/19   Roxan Hockey, MD  simvastatin (ZOCOR) 20 MG tablet Take 20 mg by mouth daily.    [provider]  tamsulosin (FLOMAX) 0.4 MG CAPS capsule Take 1 capsule (0.4 mg total) by mouth daily. Patient not taking: Reported on 03/08/2019 12/03/18   Roxan Hockey, MD    Kathi Ludwig, MD [facility] Internal Medicine, PGY-3  Please see Attending A/P and/or Addendum for final recommendations.

## 2019-03-09 NOTE — Progress Notes (Signed)
BG 78

## 2019-03-09 NOTE — Progress Notes (Signed)
Patient transferred to [facility] with pick up as of 1/14 at 7am. We appreciate their assistance with our patient.

## 2019-03-10 LAB — GLUCOSE, CAPILLARY
Glucose-Capillary: 70 mg/dL (ref 70–99)
Glucose-Capillary: 98 mg/dL (ref 70–99)
Glucose-Capillary: 79 mg/dL (ref 70–99)
Glucose-Capillary: 130 mg/dL — ABNORMAL HIGH (ref 70–99)
Glucose-Capillary: 77 mg/dL (ref 70–99)
Glucose-Capillary: 91 mg/dL (ref 70–99)
Glucose-Capillary: 82 mg/dL (ref 70–99)
Glucose-Capillary: 69 mg/dL — ABNORMAL LOW (ref 70–99)
Glucose-Capillary: 127 mg/dL — ABNORMAL HIGH (ref 70–99)

## 2019-03-10 LAB — CBC
HCT: 30.9 % — ABNORMAL LOW (ref 39.0–52.0)
Hemoglobin: 9 g/dL — ABNORMAL LOW (ref 13.0–17.0)
MCH: 26.9 pg (ref 26.0–34.0)
MCHC: 29.1 g/dL — ABNORMAL LOW (ref 30.0–36.0)
MCV: 92.2 fL (ref 80.0–100.0)
Platelets: 313 10*3/uL (ref 150–400)
RBC: 3.35 MIL/uL — ABNORMAL LOW (ref 4.22–5.81)
RDW: 17.9 % — ABNORMAL HIGH (ref 11.5–15.5)
WBC: 13.5 10*3/uL — ABNORMAL HIGH (ref 4.0–10.5)
nRBC: 0.4 % — ABNORMAL HIGH (ref 0.0–0.2)

## 2019-03-10 LAB — BASIC METABOLIC PANEL
Anion gap: 8 (ref 5–15)
BUN: 15 mg/dL (ref 8–23)
CO2: 23 mmol/L (ref 22–32)
Calcium: 8.4 mg/dL — ABNORMAL LOW (ref 8.9–10.3)
Chloride: 114 mmol/L — ABNORMAL HIGH (ref 98–111)
Creatinine, Ser: 1.49 mg/dL — ABNORMAL HIGH (ref 0.61–1.24)
GFR calc Af Amer: 55 mL/min — ABNORMAL LOW (ref >60)
GFR calc non Af Amer: 47 mL/min — ABNORMAL LOW (ref >60)
Glucose, Bld: 89 mg/dL (ref 70–99)
Potassium: 4.5 mmol/L (ref 3.5–5.1)
Sodium: 145 mmol/L (ref 135–145)

## 2019-03-10 LAB — PROTIME-INR
INR: 3.9 — ABNORMAL HIGH (ref 0.8–1.2)
Prothrombin Time: 37.9 seconds — ABNORMAL HIGH (ref 11.4–15.2)

## 2019-03-10 LAB — LEGIONELLA PNEUMOPHILA SEROGP 1 UR AG: L. pneumophila Serogp 1 Ur Ag: NEGATIVE

## 2019-03-10 LAB — PROCALCITONIN: Procalcitonin: 0.25 ng/mL

## 2019-03-10 MED ORDER — CLONAZEPAM 0.5 MG PO TABS
0.2500 mg | ORAL_TABLET | Freq: Every day | ORAL | Status: DC | PRN
  Administered 2019-03-16 – 2019-03-18 (×3): 0.25 mg via ORAL
  Filled 2019-03-10 (×3): qty 1

## 2019-03-10 MED ORDER — RASAGILINE MESYLATE 1 MG PO TABS
1.0000 mg | ORAL_TABLET | Freq: Every day | ORAL | Status: DC
  Administered 2019-03-11 – 2019-03-18 (×8): 1 mg via ORAL
  Filled 2019-03-10 (×9): qty 1

## 2019-03-10 MED ORDER — VITAMIN K1 10 MG/ML IJ SOLN
5.0000 mg | Freq: Once | INTRAVENOUS | Status: AC
  Administered 2019-03-10: 5 mg via INTRAVENOUS
  Filled 2019-03-10: qty 0.5

## 2019-03-10 MED ORDER — SIMVASTATIN 20 MG PO TABS
20.0000 mg | ORAL_TABLET | Freq: Every day | ORAL | Status: DC
  Administered 2019-03-11 – 2019-03-18 (×8): 20 mg via ORAL
  Filled 2019-03-10 (×8): qty 1

## 2019-03-10 MED ORDER — CLOZAPINE 25 MG PO TABS
50.0000 mg | ORAL_TABLET | Freq: Every day | ORAL | Status: DC
  Administered 2019-03-11 – 2019-03-17 (×7): 50 mg via ORAL
  Filled 2019-03-10 (×10): qty 2

## 2019-03-10 MED ORDER — BUSPIRONE HCL 5 MG PO TABS
15.0000 mg | ORAL_TABLET | Freq: Two times a day (BID) | ORAL | Status: DC
  Administered 2019-03-11 – 2019-03-18 (×15): 15 mg via ORAL
  Filled 2019-03-10 (×16): qty 3

## 2019-03-10 MED ORDER — DEXTROSE 50 % IV SOLN
12.5000 g | INTRAVENOUS | Status: AC
  Administered 2019-03-10: 12.5 g via INTRAVENOUS
  Filled 2019-03-10: qty 50

## 2019-03-10 NOTE — Progress Notes (Signed)
Hypoglycemic Event  CBG: 70  Treatment: D50 25 mL (12.5 gm), increased D5% LR gtt to 125 mL/hr per order from [facility] MD  Symptoms: Shaky  Follow-up CBG: Time:0143 CBG Result:98  Possible Reasons for Event: Other: pt NPO and D5% LR infusing at 75 mL/hr  Comments/MD notified: RN at [facility] notified [place]

## 2019-03-10 NOTE — Progress Notes (Signed)
Patient transfer from 2N  Patient is sleeping.

## 2019-03-10 NOTE — Evaluation (Signed)
Physical Therapy Evaluation Patient Details Name: [name] MRN: [id number] DOB: November 04, 1950 Today's Date: 03/10/2019   History of Present Illness  69 year old male with past medical history significant for schizophrenia, Parkinson's disease, intellectual disability, dementia, history of DVT on warfarin who presented from skilled nursing facility due to altered mental status. Pt found to have sub-acute vs chronic L SDH, along with septic schock 2/2 R PNA.  Clinical Impression  Pt presents to PT with deficits in cognition, functional mobility, gait, balance, endurance, tone. Pt with history of cognitive impairment, history obtained from chart, unable to consistently follow commands at this time. Pt resistant at times to mobility with PT during evaluation, and demonstrating increased tone (possibly baseline due to parkinsons disease). Per chart review pt ambulates independently at baseline. Pt will benefit from acute PT POC to improve functional mobility quality and reduce falls risk.    Follow Up Recommendations SNF;Supervision/Assistance - 24 hour    Equipment Recommendations  (defer to post-acute setting)    Recommendations for Other Services       Precautions / Restrictions Precautions Precautions: Fall Restrictions Weight Bearing Restrictions: No      Mobility  Bed Mobility Overal bed mobility: Needs Assistance Bed Mobility: Supine to Sit;Sit to Supine;Rolling Rolling: Max assist   Supine to sit: Max assist Sit to supine: Max assist   General bed mobility comments: pt resistant to PT mobility wanting to return to supine soon after sitting up  Transfers Overall transfer level: (unable to attempt as pt attempting to return to supine)                  Ambulation/Gait                Stairs            Wheelchair Mobility    Modified Rankin (Stroke Patients Only)       Balance Overall balance assessment: Needs assistance Sitting-balance  support: Bilateral upper extremity supported;Feet supported Sitting balance-Leahy Scale: Poor Sitting balance - Comments: min-modA Postural control: Posterior lean;Right lateral lean                                   Pertinent Vitals/Pain Pain Assessment: Faces Pain Score: 0-No pain Faces Pain Scale: Hurts even more Pain Location: unable to determine Pain Descriptors / Indicators: Grimacing;Guarding Pain Intervention(s): Limited activity within patient's tolerance    Home Living Family/patient expects to be discharged to:: Skilled nursing facility                 Additional Comments: unsure if pt at SNF or group home prior to admission    Prior Function Level of Independence: Needs assistance   Gait / Transfers Assistance Needed: household ambulator per chart review  ADL's / Homemaking Assistance Needed: assisted by group home staff        Hand Dominance        Extremity/Trunk Assessment   Upper Extremity Assessment Upper Extremity Assessment: Difficult to assess due to impaired cognition(increased tone vs resistance to PT, unable to fully assess)    Lower Extremity Assessment Lower Extremity Assessment: Difficult to assess due to impaired cognition(pt with increased adductor tone noted in BLE)    Cervical / Trunk Assessment Cervical / Trunk Assessment: Normal  Communication   Communication: Receptive difficulties;Expressive difficulties  Cognition Arousal/Alertness: Lethargic Behavior During Therapy: Restless;Impulsive Overall Cognitive Status: History of cognitive impairments - at baseline  General Comments: pt able to follow simple single step commands with increased cueing 25-50% of the time, unable to follow multi-step commands      General Comments General comments (skin integrity, edema, etc.): VSS on RA    Exercises     Assessment/Plan    PT Assessment Patient needs continued PT  services  PT Problem List Decreased strength;Decreased range of motion;Decreased activity tolerance;Decreased balance;Decreased mobility;Decreased cognition;Decreased knowledge of use of DME;Decreased safety awareness;Decreased knowledge of precautions;Impaired tone       PT Treatment Interventions DME instruction;Gait training;Functional mobility training;Therapeutic activities;Therapeutic exercise;Balance training;Neuromuscular re-education;Patient/family education    PT Goals (Current goals can be found in the Care Plan section)  Acute Rehab PT Goals Patient Stated Goal: Pt unable to state. Return to prior level of mobility PT Goal Formulation: Patient unable to participate in goal setting Time For Goal Achievement: 03/24/19 Potential to Achieve Goals: Fair    Frequency Min 2X/week   Barriers to discharge        Co-evaluation               AM-PAC PT "6 Clicks" Mobility  Outcome Measure Help needed turning from your back to your side while in a flat bed without using bedrails?: Total Help needed moving from lying on your back to sitting on the side of a flat bed without using bedrails?: Total Help needed moving to and from a bed to a chair (including a wheelchair)?: Total Help needed standing up from a chair using your arms (e.g., wheelchair or bedside chair)?: Total Help needed to walk in hospital room?: Total Help needed climbing 3-5 steps with a railing? : Total 6 Click Score: 6    End of Session Equipment Utilized During Treatment: (none) Activity Tolerance: Other (comment)(limited due to cognitive status) Patient left: in bed;with call bell/phone within reach;with bed alarm set Nurse Communication: Mobility status PT Visit Diagnosis: Other abnormalities of gait and mobility (R26.89);Other symptoms and signs involving the nervous system (R29.898);Difficulty in walking, not elsewhere classified (R26.2)    Time: 7121-9758 PT Time Calculation (min) (ACUTE ONLY): 28  min   Charges:   PT Evaluation $PT Eval High Complexity: 1 High          Zenaida Niece, PT, DPT Acute Rehabilitation Pager: [phone]   Zenaida Niece 03/10/2019, 4:12 PM

## 2019-03-10 NOTE — Progress Notes (Signed)
eLink Physician-Brief Progress Note Patient Name: GEDEON BRANDOW DOB: [birth year] MRN: [id number]   Date of Service  03/10/2019  HPI/Events of Note  Notified of hypoglycemia at 1.45 am (note being written now, had discussed with RN earlier)  eICU Interventions  Increase d5LR to 125 cc/hr Check glucose in one hour D/W rn      Intervention Category Major Interventions: Hyperglycemia - active titration of insulin therapy  Margaretmary Lombard 03/10/2019, 2:17 AM

## 2019-03-10 NOTE — Evaluation (Signed)
Clinical/Bedside Swallow Evaluation Patient Details  Name: [name] MRN: [id number] Date of Birth: 31-Aug-1950  Today's Date: 03/10/2019 Time: SLP Start Time (ACUTE ONLY): 1300 SLP Stop Time (ACUTE ONLY): 1329 SLP Time Calculation (min) (ACUTE ONLY): 29 min  Past Medical History:  Past Medical History:  Diagnosis Date  . Cellulitis   . Dementia ([place])   . Edema   . Hypertension   . Hypoglycemia   . Mental retardation   . Parkinson's disease ([place])   . Psychosis ([place])   . Renal insufficiency    Past Surgical History:  Past Surgical History:  Procedure Laterality Date  . COLONOSCOPY N/A 03/12/2015   Procedure: COLONOSCOPY;  Surgeon: Danie Binder, MD;  [facility];  Service: Endoscopy;  Laterality: N/A;  1430 - moved to 1/16 @ 1:45 - office to notify   HPI:  Jeremiah Natale Thoma. Coleman, 68y/m, presented to ED with incoherent speech, reportedly weaker on the right side and unable to sit or stand. PMH of schizophrenia, Parkinson's disease, intellectual disability, dementia and h/o DVT. At baseline pt lives in SNF, mostly verbal, ambulates w/o assistance, able to feed himself. No known h/o dysphagia. CT head positive for subdural hematoma with right midline shift.  Neurosurgery was consulted and felt that this was a subacute or chronic change.   Assessment / Plan / Recommendation Clinical Impression  Clinical swallow evaluation completed at bedside. Patient presents with oropharyngeal dysphagia in the setting of acute illness. Generalized weakness in oral cavity resulting in prolonged mastication and transfer  with dry solids. Liquid wash was used to aid in oral clearance. Pt tolerated liquids with oral holdingbut  no anterior spillage, cough or clearing. No change in vocal quality or respiratory status noted throughout evaluation. RN noted patient wouldn't initiate a swallow at all this morning. Recommend Dys 1, thin liquid diet with aspiration precautions. Small bites,  sips, and total assist to feed pt, Medication crushed in puree. Speech Therapy to follow to determine diet tolerance and advancement. SLP Visit Diagnosis: Dysphagia, oropharyngeal phase (R13.12)    Aspiration Risk  Mild aspiration risk    Diet Recommendation Dysphagia 1 (Puree);Thin liquid   Liquid Administration via: Cup;Straw Medication Administration: Crushed with puree Supervision: (total assistance) Compensations: Slow rate;Small sips/bites Postural Changes: Seated upright at 90 degrees    Other  Recommendations Oral Care Recommendations: Oral care QID   Follow up Recommendations Skilled Nursing facility      Frequency and Duration min 2x/week  1 week       Prognosis Prognosis for Safe Diet Advancement: Good Barriers to Reach Goals: Cognitive deficits      Swallow Study   General Date of Onset: 03/08/19 HPI: [name]. Hata, 68y/m, presented to ED with incoherent speech, reportedly weaker on the right side and unable to sit or stand. PMH of schizophrenia, Parkinson's disease, intellectual disability, dementia and h/o DVT. At baseline pt lives in SNF, mostly verbal, ambulates w/o assistance, able to feed himself. No known h/o dysphagia. CT head positive for subdural hematoma with right midline shift.  Neurosurgery was consulted and felt that this was a subacute or chronic change. Type of Study: Bedside Swallow Evaluation Previous Swallow Assessment: none Diet Prior to this Study: NPO Temperature Spikes Noted: No Respiratory Status: Room air History of Recent Intubation: No Behavior/Cognition: Lethargic/Drowsy;Requires cueing Oral Cavity Assessment: Within Functional Limits Oral Care Completed by SLP: Yes Oral Cavity - Dentition: Edentulous Vision: Functional for self-feeding Self-Feeding Abilities: Total assist Patient Positioning: Upright in  bed Baseline Vocal Quality: Normal Volitional Cough: Cognitively unable to elicit Volitional Swallow: Unable to  elicit    Oral/Motor/Sensory Function Overall Oral Motor/Sensory Function: Generalized oral weakness   Ice Chips Ice chips: Within functional limits Presentation: Spoon   Thin Liquid Thin Liquid: Within functional limits Presentation: Cup;Straw    Nectar Thick Nectar Thick Liquid: Not tested   Honey Thick Honey Thick Liquid: Not tested   Puree Puree: Within functional limits Presentation: Spoon   Solid     Solid: Impaired Presentation: Spoon Oral Phase Impairments: Impaired mastication;Reduced lingual movement/coordination;Poor awareness of bolus Oral Phase Functional Implications: Prolonged oral transit      Wynelle Bourgeois., MA CCC-SLP 03/10/2019,1:39 PM

## 2019-03-10 NOTE — Progress Notes (Signed)
PROGRESS NOTE    [name]  QQV:[id number] DOB: 22-Aug-1950 DOA: 03/08/2019 PCP: Rosita Fire, MD     Brief Narrative:  [name] is a 69 year old male with past medical history significant for schizophrenia, Parkinson's disease, intellectual disability, dementia, history of DVT on warfarin who presented from skilled nursing facility due to altered mental status.  At baseline, he is alert and oriented to self, mostly nonverbal, ambulates without assistance, able to feed himself.  However at the nursing facility they noted that he was unable to sit or stand, had incoherent speech and reportedly weaker on the right side.  Work-up revealed CT head positive for subdural hematoma with right midline shift.  Neurosurgery was consulted and felt that this was a subacute or chronic change.  Recommended to hold anticoagulation.  Due to sepsis and septic shock, patient was started on Levophed and broad-spectrum antibiotics, transferred to [facility] for further evaluation and management under PCCM service.  He was weaned off pressors and transferred to [facility] 1/14.  New events last 24 hours / Subjective: No acute events overnight.  Patient seems to be close to his baseline, remains mostly nonverbal this morning but is alert and awake and in no acute distress.  Assessment & Plan:   Active Problems:   Hypotension   Encephalopathy   Pressure injury of skin   Subdural hematoma (HCC)   Septic shock secondary to suspected right pneumonia, sepsis present on admission -Influenza, Covid negative -Strep pneumo antigen negative -Weaned off Levophed and blood pressure remained stable -MRSA swab negative, stop vancomycin -Blood cultures negative to date -CXR: Subtle asymmetric opacity in the right upper lobe is new from previous exam. Cannot rule out early pneumonia. Followup PA and lateral chest X-ray is recommended in 3-4 weeks following trial of antibiotic therapy to ensure  resolution and exclude underlying malignancy  -Continue cefepime  Acute anemia, without evidence of blood loss -Hemoglobin on presentation 5.5 -FOBT negative -Iron studies does not indicate iron deficiency anemia, vitamin B12 within normal -Patient given 2 unit pRBC and IV vitamin K -Hemoglobin remained stable this morning 9.0  Acute metabolic encephalopathy -Seems to be improving  CKD stage 3a -Baseline creatinine 1.4 -Stable   Left chronic subdural hematoma -CT head 1/12: Interval development of a thin intermediate density subdural hematoma overlying the left cerebral convexity measuring 9 mm in thickness. Mild mass effect upon the underlying left cerebral hemisphere. 2 mm rightward midline shift. -Repeat CT head 1/13: 9 mm extra-axial fluid collection left is larger compared with yesterday now measuring up to 9 mm. Findings compatible with chronic subdural hematoma. Slight increase in midline shift to the right now 3 mm. -Neurosurgery recommended nonsurgical management  -Stop warfarin  History of right lower extremity DVT in September 2020 -Thought to be provoked in setting of immobilization and treatment for cellulitis in August 2020 -Stop Coumadin  History of schizophrenia, Parkinson's disease -Continue amantadine, BuSpar, Sinemet, Klonopin, Clozaril, rasagiline  Hyperlipidemia -Continue Zocor     In agreement with assessment of the pressure ulcer as below:  Pressure Injury 03/09/19 Anus Mid Stage 2 -  Partial thickness loss of dermis presenting as a shallow open injury with a red, pink wound bed without slough. (Active)  03/09/19 0323  Location: Anus  Location Orientation: Mid  Staging: Stage 2 -  Partial thickness loss of dermis presenting as a shallow open injury with a red, pink wound bed without slough.  Wound Description (Comments):   Present on Admission: Yes  DVT prophylaxis: SCD Code Status: Full code Family Communication: None at  bedside Disposition Plan: Pending further improvement in his clinical status including continuing IV antibiotics.  SLP, PT, OT to evaluate   Consultants:   PCCM admission   Antimicrobials:  Anti-infectives (From admission, onward)   Start     Dose/Rate Route Frequency Ordered Stop   03/09/19 1500  vancomycin (VANCOCIN) IVPB 1000 mg/200 mL premix  Status:  Discontinued     1,000 mg 200 mL/hr over 60 Minutes Intravenous Every 24 hours 03/08/19 1436 03/10/19 0714   03/09/19 0000  ceFEPIme (MAXIPIME) 2 g in sodium chloride 0.9 % 100 mL IVPB     2 g 200 mL/hr over 30 Minutes Intravenous Every 12 hours 03/08/19 1437     03/08/19 1500  vancomycin (VANCOREADY) IVPB 500 mg/100 mL     500 mg 100 mL/hr over 60 Minutes Intravenous  Once 03/08/19 1435 03/08/19 1828   03/08/19 1230  vancomycin (VANCOCIN) IVPB 1000 mg/200 mL premix     1,000 mg 200 mL/hr over 60 Minutes Intravenous  Once 03/08/19 1229 03/08/19 1500   03/08/19 1230  ceFEPIme (MAXIPIME) 2 g in sodium chloride 0.9 % 100 mL IVPB     2 g 200 mL/hr over 30 Minutes Intravenous  Once 03/08/19 1229 03/08/19 1330   03/08/19 1230  azithromycin (ZITHROMAX) 500 mg in sodium chloride 0.9 % 250 mL IVPB     500 mg 250 mL/hr over 60 Minutes Intravenous  Once 03/08/19 1229 03/08/19 1402        Objective: Vitals:   03/10/19 0500 03/10/19 0600 03/10/19 0700 03/10/19 0815  BP: (!) 120/103 118/75 125/74   Pulse: 70 74 64   Resp: 14 16    Temp:    97.7 F (36.5 C)  TempSrc:    Oral  SpO2: 96% 100% 98%   Weight: 70.2 kg       Intake/Output Summary (Last 24 hours) at 03/10/2019 1003 Last data filed at 03/10/2019 0600 Gross per 24 hour  Intake 2060.39 ml  Output 1225 ml  Net 835.39 ml   Filed Weights   03/09/19 0254 03/10/19 0500  Weight: 71.1 kg 70.2 kg    Examination:  General exam: Appears calm and comfortable  Respiratory system: Clear to auscultation. Respiratory effort normal. No respiratory distress.  On room  air Cardiovascular system: S1 & S2 heard, RRR. No murmurs. Gastrointestinal system: Abdomen is nondistended, soft and nontender. Normal bowel sounds heard. Central nervous system: Alert but remains nonverbal Extremities: Symmetric in appearance    Data Reviewed: I have personally reviewed following labs and imaging studies  CBC: Recent Labs  Lab 03/08/19 1224 03/08/19 1224 03/09/19 0038 03/09/19 0038 03/09/19 0439 03/09/19 0514 03/09/19 1106 03/09/19 1801 03/09/19 2355  WBC 4.6   < > 8.0  --   --  10.1 10.1 12.2* 13.5*  NEUTROABS 3.5  --   --   --   --   --   --   --   --   HGB 5.5*   < > 8.7*   < > 8.8* 8.4* 8.5* 9.6* 9.0*  HCT 20.2*   < > 29.6*   < > 26.0* 27.8* 28.6* 31.8* 30.9*  MCV 93.5   < > 91.9  --   --  88.8 89.9 90.1 92.2  PLT 310   < > 368  --   --  366 335 326 313   < > = values in this interval not displayed.  Basic Metabolic Panel: Recent Labs  Lab 03/08/19 1224 03/09/19 0038 03/09/19 0439 03/09/19 0514 03/09/19 2355  NA 141 142 145 144 145  K 4.1 4.4 4.2 4.3 4.5  CL 104 110  --  109 114*  CO2 31 27  --  27 23  GLUCOSE 90 83  --  119* 89  BUN 31* 23  --  21 15  CREATININE 1.66* 1.62*  --  1.53* 1.49*  CALCIUM 8.6* 8.0*  --  8.4* 8.4*  MG  --   --   --  2.8*  --   PHOS  --   --   --  3.1  --    GFR: Estimated Creatinine Clearance: 46.5 mL/min (A) (by C-G formula based on SCr of 1.49 mg/dL (H)). Liver Function Tests: Recent Labs  Lab 03/08/19 1224  AST 31  ALT 7  ALKPHOS 90  BILITOT 1.2  PROT 6.2*  ALBUMIN 2.8*   No results for input(s): LIPASE, AMYLASE in the last 168 hours. Recent Labs  Lab 03/09/19 0514  AMMONIA 38*   Coagulation Profile: Recent Labs  Lab 03/08/19 1224 03/09/19 0514  INR 3.2* 2.7*   Cardiac Enzymes: No results for input(s): CKTOTAL, CKMB, CKMBINDEX, TROPONINI in the last 168 hours. BNP (last 3 results) No results for input(s): PROBNP in the last 8760 hours. HbA1C: No results for input(s): HGBA1C in the  last 72 hours. CBG: Recent Labs  Lab 03/10/19 0107 03/10/19 0143 03/10/19 0242 03/10/19 0331 03/10/19 0812  GLUCAP 70 98 79 130* 77   Lipid Profile: No results for input(s): CHOL, HDL, LDLCALC, TRIG, CHOLHDL, LDLDIRECT in the last 72 hours. Thyroid Function Tests: No results for input(s): TSH, T4TOTAL, FREET4, T3FREE, THYROIDAB in the last 72 hours. Anemia Panel: Recent Labs    03/08/19 1322  VITAMINB12 544  FOLATE 4.9*  FERRITIN 296  TIBC 228*  IRON 49  RETICCTPCT 6.5*   Sepsis Labs: Recent Labs  Lab 03/08/19 1149 03/08/19 1224 03/08/19 1322  PROCALCITON  --  <0.10  --   LATICACIDVEN 1.2  --  1.1    Recent Results (from the past 240 hour(s))  Culture, blood (Routine x 2)     Status: None (Preliminary result)   Collection Time: 03/08/19 12:23 PM   Specimen: BLOOD RIGHT WRIST  Result Value Ref Range Status   Specimen Description BLOOD RIGHT WRIST  Final   Special Requests   Final    BOTTLES DRAWN AEROBIC AND ANAEROBIC Blood Culture adequate volume   Culture   Final    NO GROWTH 2 DAYS Performed at [facility], [address], [place], [place] [address]    Report Status PENDING  Incomplete  Culture, blood (Routine x 2)     Status: None (Preliminary result)   Collection Time: 03/08/19 12:24 PM   Specimen: BLOOD RIGHT FOREARM  Result Value Ref Range Status   Specimen Description BLOOD RIGHT FOREARM  Final   Special Requests   Final    BOTTLES DRAWN AEROBIC AND ANAEROBIC Blood Culture adequate volume   Culture   Final    NO GROWTH 2 DAYS Performed at [facility], [address], [place],  [address]    Report Status PENDING  Incomplete  MRSA PCR Screening     Status: None   Collection Time: 03/08/19  2:35 PM   Specimen: Nasal Mucosa; Nasopharyngeal  Result Value Ref Range Status   MRSA by PCR NEGATIVE NEGATIVE Final    Comment:  The GeneXpert MRSA Assay (FDA approved for NASAL specimens only), is one component of a comprehensive MRSA  colonization surveillance program. It is not intended to diagnose MRSA infection nor to guide or monitor treatment for MRSA infections. Performed at [facility], [address] [place], [place] [address]   Respiratory Panel by RT PCR (Flu A&B, Covid) - Nasopharyngeal Swab     Status: None   Collection Time: 03/08/19  2:55 PM   Specimen: Nasopharyngeal Swab  Result Value Ref Range Status   SARS Coronavirus 2 by RT PCR NEGATIVE NEGATIVE Final    Comment: (NOTE) SARS-CoV-2 target nucleic acids are NOT DETECTED. The SARS-CoV-2 RNA is generally detectable in upper respiratoy specimens during the acute phase of infection. The lowest concentration of SARS-CoV-2 viral copies this assay can detect is 131 copies/mL. A negative result does not preclude SARS-Cov-2 infection and should not be used as the sole basis for treatment or other patient management decisions. A negative result may occur with  improper specimen collection/handling, submission of specimen other than nasopharyngeal swab, presence of viral mutation(s) within the areas targeted by this assay, and inadequate number of viral copies (<131 copies/mL). A negative result must be combined with clinical observations, patient history, and epidemiological information. The expected result is Negative. Fact Sheet for Patients:  PinkCheek.be Fact Sheet for Healthcare Providers:  GravelBags.it This test is not yet ap proved or cleared by the [place] FDA and  has been authorized for detection and/or diagnosis of SARS-CoV-2 by FDA under an Emergency Use Authorization (EUA). This EUA will remain  in effect (meaning this test can be used) for the duration of the COVID-19 declaration under Section 564(b)(1) of the Act, 21 U.S.C. section 360bbb-3(b)(1), unless the authorization is terminated or revoked sooner.    Influenza A by PCR NEGATIVE NEGATIVE Final   Influenza B by PCR  NEGATIVE NEGATIVE Final    Comment: (NOTE) The Xpert Xpress SARS-CoV-2/FLU/RSV assay is intended as an aid in  the diagnosis of influenza from Nasopharyngeal swab specimens and  should not be used as a sole basis for treatment. Nasal washings and  aspirates are unacceptable for Xpert Xpress SARS-CoV-2/FLU/RSV  testing. Fact Sheet for Patients: PinkCheek.be Fact Sheet for Healthcare Providers: GravelBags.it This test is not yet approved or cleared by the [place] FDA and  has been authorized for detection and/or diagnosis of SARS-CoV-2 by  FDA under an Emergency Use Authorization (EUA). This EUA will remain  in effect (meaning this test can be used) for the duration of the  Covid-19 declaration under Section 564(b)(1) of the Act, 21  U.S.C. section 360bbb-3(b)(1), unless the authorization is  terminated or revoked. Performed at [facility], [address], [place], [place] [address]   MRSA PCR Screening     Status: None   Collection Time: 03/09/19  3:04 AM   Specimen: Nasopharyngeal  Result Value Ref Range Status   MRSA by PCR NEGATIVE NEGATIVE Final    Comment:        The GeneXpert MRSA Assay (FDA approved for NASAL specimens only), is one component of a comprehensive MRSA colonization surveillance program. It is not intended to diagnose MRSA infection nor to guide or monitor treatment for MRSA infections. Performed at [facility], [address] [place], [place] [address]       Radiology Studies: CT HEAD WO CONTRAST  Result Date: 03/09/2019 CLINICAL DATA:  Subdural hematoma EXAM: CT HEAD WITHOUT CONTRAST TECHNIQUE: Contiguous axial images were obtained from the  base of the skull through the vertex without intravenous contrast. COMPARISON:  CT head 03/08/2019 FINDINGS: Brain: Low-density extra-axial fluid collection left appears slightly larger today now measuring up to 9 mm in thickness. This appears to  be a chronic subdural hematoma and is slightly more dense than ventricles. No high-density hemorrhage. 3 mm midline shift to the right slightly more prominent. Ventricle size normal. Patchy white matter hypodensity bilaterally appears chronic. No acute infarct. Vascular: Negative for hyperdense vessel Skull: Negative Sinuses/Orbits: Negative Other: None IMPRESSION: 9 mm extra-axial fluid collection left is larger compared with yesterday now measuring up to 9 mm. Findings compatible with chronic subdural hematoma. Slight increase in midline shift to the right now 3 mm. Electronically Signed   By: Franchot Gallo M.D.   On: 03/09/2019 16:29   CT Head Wo Contrast  Result Date: 03/08/2019 CLINICAL DATA:  Altered mental status (AMS), unclear cause. Additional history provided: Patient unable to stand or supportive self this morning, weaker on the right side. EXAM: CT HEAD WITHOUT CONTRAST TECHNIQUE: Contiguous axial images were obtained from the base of the skull through the vertex without intravenous contrast. COMPARISON:  Head CT 02/10/2019 FINDINGS: Brain: There has been interval development of a thin intermediate density subdural hematoma overlying the left cerebral convexity since CT 02/10/2019. This measures up to 9 mm in thickness. Mild mass effect upon the underlying left cerebral hemisphere. 2 mm rightward midline shift at the level of the septum pellucidum. No evidence of intracranial mass. No demarcated cortical infarct. Ill-defined hypoattenuation within the cerebral white matter is nonspecific, but consistent with chronic small vessel ischemic disease. Mild generalized parenchymal atrophy. Vascular: No hyperdense vessel Skull: Normal. Negative for fracture or focal lesion. Sinuses/Orbits: Visualized orbits demonstrate no acute abnormality. Mild ethmoid sinus mucosal thickening. No significant mastoid effusion. These results were called by telephone at the time of interpretation on 03/08/2019 at 2:12 pm to  provider Dr. Lacinda Axon, who verbally acknowledged these results. IMPRESSION: Interval development of a thin intermediate density subdural hematoma overlying the left cerebral convexity measuring 9 mm in thickness. Mild mass effect upon the underlying left cerebral hemisphere. 2 mm rightward midline shift. Stable generalized parenchymal atrophy and chronic small vessel ischemic disease. Electronically Signed   By: Kellie Simmering DO   On: 03/08/2019 14:12   CT Cervical Spine Wo Contrast  Result Date: 03/08/2019 CLINICAL DATA:  Status post trauma. EXAM: CT CERVICAL SPINE WITHOUT CONTRAST TECHNIQUE: Multidetector CT imaging of the cervical spine was performed without intravenous contrast. Multiplanar CT image reconstructions were also generated. COMPARISON:  None. FINDINGS: Alignment: Minimal grade 1 retrolisthesis of C4-5 is noted secondary to severe degenerative disc disease. Skull base and vertebrae: No acute fracture. No primary bone lesion or focal pathologic process. Soft tissues and spinal canal: No prevertebral fluid or swelling. No visible canal hematoma. Disc levels: Severe degenerative disc disease is noted at C3-4, C4-5, C5-6 and C6-7. Upper chest: Negative. Other: None. IMPRESSION: Severe multilevel degenerative disc disease. No acute abnormality seen in the cervical spine. Electronically Signed   By: Marijo Conception M.D.   On: 03/08/2019 15:53   DG Chest Portable 1 View  Result Date: 03/08/2019 CLINICAL DATA:  Altered mental status EXAM: PORTABLE CHEST 1 VIEW COMPARISON:  02/11/2019 FINDINGS: The heart size and mediastinal contours are within normal limits. Subtle asymmetric opacity within the right upper lobe is new from previous exam. No pleural effusion or edema identified the visualized skeletal structures are unremarkable. IMPRESSION: 1. Subtle asymmetric opacity in the  right upper lobe is new from previous exam. Cannot rule out early pneumonia. Followup PA and lateral chest X-ray is recommended in  3-4 weeks following trial of antibiotic therapy to ensure resolution and exclude underlying malignancy. 2. No heart failure. Electronically Signed   By: Kerby Moors M.D.   On: 03/08/2019 11:52      Scheduled Meds:  amantadine  100 mg Oral BID   carbidopa-levodopa  2 tablet Oral QID   Chlorhexidine Gluconate Cloth  6 each Topical Daily   dextrose  1 ampule Intravenous Once   pantoprazole (PROTONIX) IV  40 mg Intravenous Q24H   Continuous Infusions:  sodium chloride Stopped (03/08/19 2335)   ceFEPime (MAXIPIME) IV 2 g (03/10/19 0950)   dextrose 5% lactated ringers 125 mL/hr at 03/10/19 0542     LOS: 2 days      Time spent: 45 minutes   Dessa Phi, DO [facility] 03/10/2019, 10:03 AM   Available via Epic secure chat 7am-7pm After these hours, please refer to coverage provider listed on [email or web address]

## 2019-03-11 LAB — CBC
HCT: 27.1 % — ABNORMAL LOW (ref 39.0–52.0)
Hemoglobin: 8 g/dL — ABNORMAL LOW (ref 13.0–17.0)
MCH: 27 pg (ref 26.0–34.0)
MCHC: 29.5 g/dL — ABNORMAL LOW (ref 30.0–36.0)
MCV: 91.6 fL (ref 80.0–100.0)
Platelets: 291 10*3/uL (ref 150–400)
RBC: 2.96 MIL/uL — ABNORMAL LOW (ref 4.22–5.81)
RDW: 17.6 % — ABNORMAL HIGH (ref 11.5–15.5)
WBC: 9.7 10*3/uL (ref 4.0–10.5)
nRBC: 0.5 % — ABNORMAL HIGH (ref 0.0–0.2)

## 2019-03-11 LAB — BASIC METABOLIC PANEL
Anion gap: 10 (ref 5–15)
BUN: 11 mg/dL (ref 8–23)
CO2: 26 mmol/L (ref 22–32)
Calcium: 8.5 mg/dL — ABNORMAL LOW (ref 8.9–10.3)
Chloride: 112 mmol/L — ABNORMAL HIGH (ref 98–111)
Creatinine, Ser: 1.42 mg/dL — ABNORMAL HIGH (ref 0.61–1.24)
GFR calc Af Amer: 58 mL/min — ABNORMAL LOW (ref >60)
GFR calc non Af Amer: 50 mL/min — ABNORMAL LOW (ref >60)
Glucose, Bld: 74 mg/dL (ref 70–99)
Potassium: 4.1 mmol/L (ref 3.5–5.1)
Sodium: 148 mmol/L — ABNORMAL HIGH (ref 135–145)

## 2019-03-11 LAB — GLUCOSE, CAPILLARY
Glucose-Capillary: 122 mg/dL — ABNORMAL HIGH (ref 70–99)
Glucose-Capillary: 57 mg/dL — ABNORMAL LOW (ref 70–99)
Glucose-Capillary: 71 mg/dL (ref 70–99)
Glucose-Capillary: 78 mg/dL (ref 70–99)
Glucose-Capillary: 80 mg/dL (ref 70–99)
Glucose-Capillary: 86 mg/dL (ref 70–99)
Glucose-Capillary: 99 mg/dL (ref 70–99)
Glucose-Capillary: 99 mg/dL (ref 70–99)

## 2019-03-11 LAB — PROTIME-INR
INR: 1.9 — ABNORMAL HIGH (ref 0.8–1.2)
Prothrombin Time: 21.8 seconds — ABNORMAL HIGH (ref 11.4–15.2)

## 2019-03-11 MED ORDER — DEXTROSE 50 % IV SOLN
12.5000 g | INTRAVENOUS | Status: AC
  Administered 2019-03-11: 12.5 g via INTRAVENOUS
  Filled 2019-03-11: qty 50

## 2019-03-11 MED ORDER — DEXTROSE-NACL 5-0.45 % IV SOLN: INTRAVENOUS | Status: DC

## 2019-03-11 NOTE — Progress Notes (Signed)
Hypoglycemic Event  CBG: 69  Treatment: Dextrose 50% solution 12.5g Symptoms: None  Follow-up CBG: Time:2154 CBG Result:127  Possible Reasons for Event: Pt is NPO  Comments/MD notified:Hypoglycemic protocol initiated    Alyannah Sanks B Maxie Slovacek,RN

## 2019-03-11 NOTE — Progress Notes (Signed)
PROGRESS NOTE    [name]  WGN:[id number] DOB: [birth year] DOA: 03/08/2019 PCP: Rosita Fire, MD     Brief Narrative:  [name] is a 69 year old male with past medical history significant for schizophrenia, Parkinson's disease, intellectual disability, dementia, history of DVT on warfarin who presented from skilled nursing facility due to altered mental status.  At baseline, he is alert and oriented to self, mostly nonverbal, ambulates without assistance, able to feed himself.  However at the nursing facility they noted that he was unable to sit or stand, had incoherent speech and reportedly weaker on the right side.  Work-up revealed CT head positive for subdural hematoma with right midline shift.  Neurosurgery was consulted and felt that this was a subacute or chronic change.  Recommended to hold anticoagulation.  Due to sepsis and septic shock, patient was started on Levophed and broad-spectrum antibiotics, transferred to [facility] for further evaluation and management under PCCM service.  He was weaned off pressors and transferred to [facility] 1/14.  New events last 24 hours / Subjective: Hypoglycemic episodes overnight.  Patient remains alert, mumbling on examination  Assessment & Plan:   Active Problems:   Hypotension   Encephalopathy   Pressure injury of skin   Subdural hematoma (HCC)   Septic shock secondary to suspected right pneumonia, sepsis present on admission -Influenza, Covid negative -Strep pneumo antigen negative -Weaned off Levophed and blood pressure remained stable -MRSA swab negative, stop vancomycin -Blood cultures negative to date -CXR: Subtle asymmetric opacity in the right upper lobe is new from previous exam. Cannot rule out early pneumonia. Followup PA and lateral chest X-ray is recommended in 3-4 weeks following trial of antibiotic therapy to ensure resolution and exclude underlying malignancy  -Continue cefepime  Acute anemia,  without evidence of blood loss -Hemoglobin on presentation 5.5 -FOBT negative -Iron studies does not indicate iron deficiency anemia, vitamin B12 within normal -Patient given 2 unit pRBC and IV vitamin K -Hemoglobin remained stable this morning 8.0  Acute metabolic encephalopathy -Improved but not quite back to baseline.  At baseline, patient is largely nonverbal, only mumbles incoherently, but is able to feed himself and walk.  Resides at a group home.  CKD stage 3a -Baseline creatinine 1.4 -Stable   Left chronic subdural hematoma -CT head 1/12: Interval development of a thin intermediate density subdural hematoma overlying the left cerebral convexity measuring 9 mm in thickness. Mild mass effect upon the underlying left cerebral hemisphere. 2 mm rightward midline shift. -Repeat CT head 1/13: 9 mm extra-axial fluid collection left is larger compared with yesterday now measuring up to 9 mm. Findings compatible with chronic subdural hematoma. Slight increase in midline shift to the right now 3 mm. -Neurosurgery recommended nonsurgical management  -Stop warfarin  History of right lower extremity DVT in September 2020 -Thought to be provoked in setting of immobilization and treatment for cellulitis in August 2020 -Stop Coumadin  History of schizophrenia, Parkinson's disease -Continue amantadine, BuSpar, Sinemet, Klonopin, Clozaril, rasagiline  Hyperlipidemia -Continue Zocor  Hyponatremia -D5 half-normal saline IV fluid  Goals of care -[name] is the contact at the group home where patient has resided over the past 20 years.  He does have a brother and a cousin that group home is in contact with intermittently.  Discussed that patient has not been back to his baseline, and not eating or ambulating much.  If patient does not improve in terms of p.o. intake, patient has a poor prognosis.  Consulted palliative care  today.     In agreement with assessment of the pressure ulcer as  below:  Pressure Injury 03/09/19 Anus Mid Stage 2 -  Partial thickness loss of dermis presenting as a shallow open injury with a red, pink wound bed without slough. (Active)  03/09/19 0323  Location: Anus  Location Orientation: Mid  Staging: Stage 2 -  Partial thickness loss of dermis presenting as a shallow open injury with a red, pink wound bed without slough.  Wound Description (Comments):   Present on Admission: Yes        DVT prophylaxis: SCD Code Status: Full code Family Communication: None at bedside.  Discussed with caregiver listed Disposition Plan: Pending further improvement in his clinical status including continuing IV antibiotics.  Palliative care medicine consulted today.  SNF recommended by PT   Consultants:   PCCM admission  Palliative care medicine   Antimicrobials:  Anti-infectives (From admission, onward)   Start     Dose/Rate Route Frequency Ordered Stop   03/09/19 1500  vancomycin (VANCOCIN) IVPB 1000 mg/200 mL premix  Status:  Discontinued     1,000 mg 200 mL/hr over 60 Minutes Intravenous Every 24 hours 03/08/19 1436 03/10/19 0714   03/09/19 0000  ceFEPIme (MAXIPIME) 2 g in sodium chloride 0.9 % 100 mL IVPB     2 g 200 mL/hr over 30 Minutes Intravenous Every 12 hours 03/08/19 1437     03/08/19 1500  vancomycin (VANCOREADY) IVPB 500 mg/100 mL     500 mg 100 mL/hr over 60 Minutes Intravenous  Once 03/08/19 1435 03/08/19 1828   03/08/19 1230  vancomycin (VANCOCIN) IVPB 1000 mg/200 mL premix     1,000 mg 200 mL/hr over 60 Minutes Intravenous  Once 03/08/19 1229 03/08/19 1500   03/08/19 1230  ceFEPIme (MAXIPIME) 2 g in sodium chloride 0.9 % 100 mL IVPB     2 g 200 mL/hr over 30 Minutes Intravenous  Once 03/08/19 1229 03/08/19 1330   03/08/19 1230  azithromycin (ZITHROMAX) 500 mg in sodium chloride 0.9 % 250 mL IVPB     500 mg 250 mL/hr over 60 Minutes Intravenous  Once 03/08/19 1229 03/08/19 1402       Objective: Vitals:   03/10/19 1636 03/10/19  2101 03/11/19 0544 03/11/19 0846  BP:  134/79 128/75 119/61  Pulse:  72 68 61  Resp:  16 20 18   Temp: 97.7 F (36.5 C) 97.9 F (36.6 C) 98.1 F (36.7 C) 98.1 F (36.7 C)  TempSrc: Axillary Oral Oral Oral  SpO2:  98% 98% 96%  Weight:  71.5 kg      Intake/Output Summary (Last 24 hours) at 03/11/2019 1117 Last data filed at 03/11/2019 0545 Gross per 24 hour  Intake 2630.28 ml  Output 920 ml  Net 1710.28 ml   Filed Weights   03/09/19 0254 03/10/19 0500 03/10/19 2101  Weight: 71.1 kg 70.2 kg 71.5 kg    Examination: General exam: Appears calm and comfortable  Respiratory system: Clear to auscultation anteriorly Cardiovascular system: S1 & S2 heard, RRR. No pedal edema. Gastrointestinal system: Abdomen is nondistended, soft and nontender. Normal bowel sounds heard. Central nervous system: Alert, mumbles incoherently, does not follow command Extremities: Symmetric in appearance bilaterally  Skin: No rashes, lesions or ulcers on exposed skin    Data Reviewed: I have personally reviewed following labs and imaging studies  CBC: Recent Labs  Lab 03/08/19 1224 03/09/19 0038 03/09/19 0514 03/09/19 1106 03/09/19 1801 03/09/19 2355 03/11/19 0339  WBC 4.6   < >  10.1 10.1 12.2* 13.5* 9.7  NEUTROABS 3.5  --   --   --   --   --   --   HGB 5.5*   < > 8.4* 8.5* 9.6* 9.0* 8.0*  HCT 20.2*   < > 27.8* 28.6* 31.8* 30.9* 27.1*  MCV 93.5   < > 88.8 89.9 90.1 92.2 91.6  PLT 310   < > 366 335 326 313 291   < > = values in this interval not displayed.   Basic Metabolic Panel: Recent Labs  Lab 03/08/19 1224 03/08/19 1224 03/09/19 0038 03/09/19 0439 03/09/19 0514 03/09/19 2355 03/11/19 0339  NA 141   < > 142 145 144 145 148*  K 4.1   < > 4.4 4.2 4.3 4.5 4.1  CL 104  --  110  --  109 114* 112*  CO2 31  --  27  --  27 23 26   GLUCOSE 90  --  83  --  119* 89 74  BUN 31*  --  23  --  21 15 11   CREATININE 1.66*  --  1.62*  --  1.53* 1.49* 1.42*  CALCIUM 8.6*  --  8.0*  --  8.4* 8.4*  8.5*  MG  --   --   --   --  2.8*  --   --   PHOS  --   --   --   --  3.1  --   --    < > = values in this interval not displayed.   GFR: Estimated Creatinine Clearance: 49.7 mL/min (A) (by C-G formula based on SCr of 1.42 mg/dL (H)). Liver Function Tests: Recent Labs  Lab 03/08/19 1224  AST 31  ALT 7  ALKPHOS 90  BILITOT 1.2  PROT 6.2*  ALBUMIN 2.8*   No results for input(s): LIPASE, AMYLASE in the last 168 hours. Recent Labs  Lab 03/09/19 0514  AMMONIA 38*   Coagulation Profile: Recent Labs  Lab 03/08/19 1224 03/09/19 0514 03/10/19 0915 03/11/19 0339  INR 3.2* 2.7* 3.9* 1.9*   Cardiac Enzymes: No results for input(s): CKTOTAL, CKMB, CKMBINDEX, TROPONINI in the last 168 hours. BNP (last 3 results) No results for input(s): PROBNP in the last 8760 hours. HbA1C: No results for input(s): HGBA1C in the last 72 hours. CBG: Recent Labs  Lab 03/11/19 0018 03/11/19 0353 03/11/19 0452 03/11/19 0520 03/11/19 0718  GLUCAP 78 71 57* 122* 80   Lipid Profile: No results for input(s): CHOL, HDL, LDLCALC, TRIG, CHOLHDL, LDLDIRECT in the last 72 hours. Thyroid Function Tests: No results for input(s): TSH, T4TOTAL, FREET4, T3FREE, THYROIDAB in the last 72 hours. Anemia Panel: Recent Labs    03/08/19 1322  VITAMINB12 544  FOLATE 4.9*  FERRITIN 296  TIBC 228*  IRON 49  RETICCTPCT 6.5*   Sepsis Labs: Recent Labs  Lab 03/08/19 1149 03/08/19 1224 03/08/19 1322 03/10/19 0533  PROCALCITON  --  <0.10  --  0.25  LATICACIDVEN 1.2  --  1.1  --     Recent Results (from the past 240 hour(s))  Culture, blood (Routine x 2)     Status: None (Preliminary result)   Collection Time: 03/08/19 12:23 PM   Specimen: BLOOD RIGHT WRIST  Result Value Ref Range Status   Specimen Description BLOOD RIGHT WRIST  Final   Special Requests   Final    BOTTLES DRAWN AEROBIC AND ANAEROBIC Blood Culture adequate volume   Culture   Final    NO GROWTH 3  DAYS Performed at [facility], [address] [place], [place] [address]    Report Status PENDING  Incomplete  Culture, blood (Routine x 2)     Status: None (Preliminary result)   Collection Time: 03/08/19 12:24 PM   Specimen: BLOOD RIGHT FOREARM  Result Value Ref Range Status   Specimen Description BLOOD RIGHT FOREARM  Final   Special Requests   Final    BOTTLES DRAWN AEROBIC AND ANAEROBIC Blood Culture adequate volume   Culture   Final    NO GROWTH 3 DAYS Performed at [facility], [address], [place], [place] [address]    Report Status PENDING  Incomplete  MRSA PCR Screening     Status: None   Collection Time: 03/08/19  2:35 PM   Specimen: Nasal Mucosa; Nasopharyngeal  Result Value Ref Range Status   MRSA by PCR NEGATIVE NEGATIVE Final    Comment:        The GeneXpert MRSA Assay (FDA approved for NASAL specimens only), is one component of a comprehensive MRSA colonization surveillance program. It is not intended to diagnose MRSA infection nor to guide or monitor treatment for MRSA infections. Performed at [facility], [address] [place],  [address]   Respiratory Panel by RT PCR (Flu A&B, Covid) - Nasopharyngeal Swab     Status: None   Collection Time: 03/08/19  2:55 PM   Specimen: Nasopharyngeal Swab  Result Value Ref Range Status   SARS Coronavirus 2 by RT PCR NEGATIVE NEGATIVE Final    Comment: (NOTE) SARS-CoV-2 target nucleic acids are NOT DETECTED. The SARS-CoV-2 RNA is generally detectable in upper respiratoy specimens during the acute phase of infection. The lowest concentration of SARS-CoV-2 viral copies this assay can detect is 131 copies/mL. A negative result does not preclude SARS-Cov-2 infection and should not be used as the sole basis for treatment or other patient management decisions. A negative result may occur with  improper specimen collection/handling, submission of specimen other than nasopharyngeal swab, presence of viral mutation(s) within the areas targeted  by this assay, and inadequate number of viral copies (<131 copies/mL). A negative result must be combined with clinical observations, patient history, and epidemiological information. The expected result is Negative. Fact Sheet for Patients:  PinkCheek.be Fact Sheet for Healthcare Providers:  GravelBags.it This test is not yet ap proved or cleared by the [place] FDA and  has been authorized for detection and/or diagnosis of SARS-CoV-2 by FDA under an Emergency Use Authorization (EUA). This EUA will remain  in effect (meaning this test can be used) for the duration of the COVID-19 declaration under Section 564(b)(1) of the Act, 21 U.S.C. section 360bbb-3(b)(1), unless the authorization is terminated or revoked sooner.    Influenza A by PCR NEGATIVE NEGATIVE Final   Influenza B by PCR NEGATIVE NEGATIVE Final    Comment: (NOTE) The Xpert Xpress SARS-CoV-2/FLU/RSV assay is intended as an aid in  the diagnosis of influenza from Nasopharyngeal swab specimens and  should not be used as a sole basis for treatment. Nasal washings and  aspirates are unacceptable for Xpert Xpress SARS-CoV-2/FLU/RSV  testing. Fact Sheet for Patients: PinkCheek.be Fact Sheet for Healthcare Providers: GravelBags.it This test is not yet approved or cleared by the [place] FDA and  has been authorized for detection and/or diagnosis of SARS-CoV-2 by  FDA under an Emergency Use Authorization (EUA). This EUA will remain  in effect (meaning this test can be used) for the duration of the  Covid-19 declaration  under Section 564(b)(1) of the Act, 21  U.S.C. section 360bbb-3(b)(1), unless the authorization is  terminated or revoked. Performed at [facility], [address], [place], [place] [address]   MRSA PCR Screening     Status: None   Collection Time: 03/09/19  3:04 AM   Specimen:  Nasopharyngeal  Result Value Ref Range Status   MRSA by PCR NEGATIVE NEGATIVE Final    Comment:        The GeneXpert MRSA Assay (FDA approved for NASAL specimens only), is one component of a comprehensive MRSA colonization surveillance program. It is not intended to diagnose MRSA infection nor to guide or monitor treatment for MRSA infections. Performed at [facility], [place] [address], [place], [place] [address]       Radiology Studies: CT HEAD WO CONTRAST  Result Date: 03/09/2019 CLINICAL DATA:  Subdural hematoma EXAM: CT HEAD WITHOUT CONTRAST TECHNIQUE: Contiguous axial images were obtained from the base of the skull through the vertex without intravenous contrast. COMPARISON:  CT head 03/08/2019 FINDINGS: Brain: Low-density extra-axial fluid collection left appears slightly larger today now measuring up to 9 mm in thickness. This appears to be a chronic subdural hematoma and is slightly more dense than ventricles. No high-density hemorrhage. 3 mm midline shift to the right slightly more prominent. Ventricle size normal. Patchy white matter hypodensity bilaterally appears chronic. No acute infarct. Vascular: Negative for hyperdense vessel Skull: Negative Sinuses/Orbits: Negative Other: None IMPRESSION: 9 mm extra-axial fluid collection left is larger compared with yesterday now measuring up to 9 mm. Findings compatible with chronic subdural hematoma. Slight increase in midline shift to the right now 3 mm. Electronically Signed   By: Franchot Gallo M.D.   On: 03/09/2019 16:29      Scheduled Meds: . amantadine  100 mg Oral BID  . busPIRone  15 mg Oral BID  . carbidopa-levodopa  2 tablet Oral QID  . Chlorhexidine Gluconate Cloth  6 each Topical Daily  . clozapine  50 mg Oral QHS  . dextrose  1 ampule Intravenous Once  . rasagiline  1 mg Oral Daily  . simvastatin  20 mg Oral Daily   Continuous Infusions: . sodium chloride Stopped (03/08/19 2335)  . ceFEPime (MAXIPIME) IV 2 g  (03/11/19 1041)  . dextrose 5 % and 0.45% NaCl 75 mL/hr at 03/11/19 0838     LOS: 3 days      Time spent: 40 minutes   Dessa Phi, DO [facility] 03/11/2019, 11:17 AM   Available via Epic secure chat 7am-7pm After these hours, please refer to coverage provider listed on [email or web address]

## 2019-03-11 NOTE — Progress Notes (Signed)
Pharmacy Antibiotic Note  [name] is a 69 y.o. male admitted on 03/08/2019 with pneumonia.  Pharmacy has been consulted for Cefepime dosing.  Plan: Cefepime 2000 mg IV every 12 hours. Monitor labs, c/s  Weight: 157 lb 10.1 oz (71.5 kg)  Temp (24hrs), Avg:97.8 F (36.6 C), Min:97.7 F (36.5 C), Max:98.1 F (36.7 C)  Recent Labs  Lab 03/08/19 1149 03/08/19 1224 03/08/19 1224 03/08/19 1322 03/09/19 0038 03/09/19 0038 03/09/19 0514 03/09/19 1106 03/09/19 1801 03/09/19 2355 03/11/19 0339  WBC  --  4.6   < >  --  8.0   < > 10.1 10.1 12.2* 13.5* 9.7  CREATININE  --  1.66*  --   --  1.62*  --  1.53*  --   --  1.49* 1.42*  LATICACIDVEN 1.2  --   --  1.1  --   --   --   --   --   --   --    < > = values in this interval not displayed.    Estimated Creatinine Clearance: 49.7 mL/min (A) (by C-G formula based on SCr of 1.42 mg/dL (H)).    Allergies  Allergen Reactions  . Penicillins     .Did it involve swelling of the face/tongue/throat, SOB, or low BP? Unknown Did it involve sudden or severe rash/hives, skin peeling, or any reaction on the inside of your mouth or nose? Unknown Did you need to seek medical attention at a hospital or doctor's office? Unknown When did it last happen?Unknown If all above answers are "NO", may proceed with cephalosporin use.     Antimicrobials this admission: Cefepime 1/12 >>  Vanco 1/12 >>12/14   Microbiology results: 1/12 BCx: NGTD 1/12 MRSA PCR: negative  Beckett Maden A. Levada Dy, PharmD, BCPS, FNKF Clinical Pharmacist [place] Please utilize Amion for appropriate phone number to reach the unit pharmacist ([place])   03/11/2019 8:08 AM

## 2019-03-11 NOTE — Progress Notes (Signed)
Hypoglycemic Event  CBG: 57  Treatment: Dextrose 50% solution 12.5g  Symptoms: None Follow-up CBG: Time: 5:20am CBG Result:122  Possible Reasons for Event: NPO Comments/MD notified: [place],NP notified    Hadley Detloff B Artina Minella,RN

## 2019-03-11 NOTE — Evaluation (Signed)
Occupational Therapy Evaluation Patient Details Name: [name] MRN: [id number] DOB: Nov 12, 1950 Today's Date: 03/11/2019    History of Present Illness 69 year old male with past medical history significant for schizophrenia, Parkinson's disease, intellectual disability, dementia, history of DVT on warfarin who presented from skilled nursing facility due to altered mental status. Pt found to have sub-acute vs chronic L SDH, along with septic shock 2/2 R PNA.   Clinical Impression   Patient admitted for above, limited by problem list below including lethargy, impaired cognition, weakness, impaired balance and tone.  Per chart review, he lives in group home and requires assist for most ADLs but can feed himself and was able to ambulate.  Today, he follows no commands, maintains eyes closed for majority of session, and resistant to movement at times.  Requires +2 total assist for bed mobility, total assist sitting EOB with posterior lean, and total assist for all self care at this time.  He opens eyes briefly to change in position, but does not maintain.  He will benefit from continued OT services while admitted and after dc at SNF level in order to progress towards PLOF with ADLs, mobility to decreased burden of care.     Follow Up Recommendations  SNF;Supervision/Assistance - 24 hour    Equipment Recommendations  Other (comment)(TBD at next venue of care )    Recommendations for Other Services Other (comment)(palliative care)     Precautions / Restrictions Precautions Precautions: Fall Restrictions Weight Bearing Restrictions: No      Mobility Bed Mobility Overal bed mobility: Needs Assistance Bed Mobility: Supine to Sit;Sit to Supine     Supine to sit: Total assist;+2 for physical assistance Sit to supine: Total assist;+2 for physical assistance   General bed mobility comments: pt requires total assist for all mobility at this time, no initation noted   Transfers                  General transfer comment: deferred    Balance Overall balance assessment: Needs assistance Sitting-balance support: Feet supported;No upper extremity supported Sitting balance-Leahy Scale: Zero Sitting balance - Comments: posterior lean and max-total support when unsupported Postural control: Posterior lean                                 ADL either performed or assessed with clinical judgement   ADL Overall ADL's : Needs assistance/impaired                                     Functional mobility during ADLs: Total assistance;+2 for physical assistance General ADL Comments: total assist for all self care at this time      Vision   Vision Assessment?: Vision impaired- to be further tested in functional context Additional Comments: keeps eyes closed majority of session, opens eyes with positional changes but resistant to assist in eye opening     Perception     Praxis      Pertinent Vitals/Pain Pain Assessment: Faces Pain Score: 2  Faces Pain Scale: Hurts a little bit Pain Location: unable to determine Pain Descriptors / Indicators: Grimacing;Guarding Pain Intervention(s): Monitored during session;Repositioned     Hand Dominance     Extremity/Trunk Assessment Upper Extremity Assessment Upper Extremity Assessment: Difficult to assess due to impaired cognition(tone vs resistance)   Lower Extremity Assessment Lower Extremity Assessment: Defer to  PT evaluation       Communication     Cognition Arousal/Alertness: Lethargic Behavior During Therapy: Flat affect Overall Cognitive Status: History of cognitive impairments - at baseline                                 General Comments: pt not following any commands today    General Comments  VSS    Exercises     Shoulder Instructions      Home Living Family/patient expects to be discharged to:: Skilled nursing facility                                  Additional Comments: was from group home per chart       Prior Functioning/Environment Level of Independence: Needs assistance  Gait / Transfers Assistance Needed: household ambulator per chart review ADL's / Homemaking Assistance Needed: able to feed self, but otherwise assisted by group home staff for ADLs--per chart review Communication / Swallowing Assistance Needed: pt nonverbal during session- per chart review largely non verbal PTA          OT Problem List: Decreased strength;Impaired balance (sitting and/or standing);Impaired UE functional use;Impaired tone;Decreased knowledge of precautions;Decreased knowledge of use of DME or AE;Decreased safety awareness;Decreased cognition;Decreased coordination;Impaired vision/perception;Decreased activity tolerance;Decreased range of motion      OT Treatment/Interventions: Self-care/ADL training;Therapeutic exercise;Balance training;Cognitive remediation/compensation;Therapeutic activities;Visual/perceptual remediation/compensation;Neuromuscular education    OT Goals(Current goals can be found in the care plan section) Acute Rehab OT Goals Patient Stated Goal: unable to state OT Goal Formulation: Patient unable to participate in goal setting Time For Goal Achievement: 03/25/19 Potential to Achieve Goals: Fair  OT Frequency: Min 2X/week   Barriers to D/C:            Co-evaluation              AM-PAC OT "6 Clicks" Daily Activity     Outcome Measure Help from another person eating meals?: Total Help from another person taking care of personal grooming?: Total Help from another person toileting, which includes using toliet, bedpan, or urinal?: Total Help from another person bathing (including washing, rinsing, drying)?: Total Help from another person to put on and taking off regular upper body clothing?: Total Help from another person to put on and taking off regular lower body clothing?: Total 6 Click Score:  6   End of Session Nurse Communication: Mobility status  Activity Tolerance: Patient limited by lethargy Patient left: in bed;with call bell/phone within reach;with bed alarm set  OT Visit Diagnosis: Other abnormalities of gait and mobility (R26.89);Cognitive communication deficit (R41.841);Other symptoms and signs involving cognitive function;Muscle weakness (generalized) (M62.81);Other symptoms and signs involving the nervous system (R29.898) Symptoms and signs involving cognitive functions: Other cerebrovascular disease                Time: 1201-1213 OT Time Calculation (min): 12 min Charges:  OT General Charges $OT Visit: 1 Visit OT Evaluation $OT Eval Moderate Complexity: 1 Mod  Jolaine Artist, OT Acute Rehabilitation Services Pager [phone] Office [phone]   Delight Stare 03/11/2019, 1:15 PM

## 2019-03-11 NOTE — Progress Notes (Addendum)
  Speech Language Pathology Treatment: Dysphagia  Patient Details Name: [name] MRN: [id number] DOB: 1950/04/09 Today's Date: 03/11/2019 Time: 2353-6144 SLP Time Calculation (min) (ACUTE ONLY): 24 min  Assessment / Plan / Recommendation Clinical Impression  Patient seen at bedside for dysphagia treatment. Patient in bed, eyes closed, sleeping. ST able to rouse patient with verbal cues, sternum rub and thermal/tactile stimulation. ST set up suction at bedside and used toothbrush with in-line suction to complete oral care. Pt edentulous, allowed ST to perform oral care, though attempting to bite down on toothbrush x2. Minimal oral residue noted in oral cavity (appeared to be from breakfast). SLP able to remove with suction. Pt did not verbalize anything. Pt opened eyes x1 when respositioned in bed. Otherwise, pt w/ eyes closed posture. Pt sat up in bed (HOB raised). Pt provided small cup sips of water, anterior labial spillage noted and brief oral hold before initiation of the swallow. Hyolaryngeal excursion observed. No cough or s/sx aspiration. ST provided thin liquids via spoon sips and straw, pt able to grasp straw and siphon liquid. Again, no s/sx aspiration seen. SLP provided small bolus of puree (applesauce) via spoon, pt extracting puree and orally holding with prolonged oral transfer observed. Some hyolaryngeal excursion observed, but unable to visualize oral cavity. ST provided small sip of thin liquids for liquid rinse, pt with prolonged oral hold before swallowing. Pt with no obvious oral residue observed following liquid rinse. Further PO trials discontinued as patient unable to sustain appropriate alertness required for PO intake.  D/W RN and NT. Both report patient tolerated puree/thin breakfast (fed by NT) and tolerated meds crushed in puree.  Sign placed at bedside indicating diet recommendations (dysphagia 1/thin liquids) and aspiration precautions. RN educated and verbalized  understanding.  Recommend continuing dysphagia 1/thin liquids, small bites/sips, alternate liquids and solids, only feed if patient alert and upright, check oral cavity for pocketed material. Continue to have suction available at bedside. ST to follow acutely.   HPI HPI: Mr Orlanda Lemmerman. [place], 68y/m, presented to ED with incoherent speech, reportedly weaker on the right side and unable to sit or stand. PMH of schizophrenia, Parkinson's disease, intellectual disability, dementia and h/o DVT. At baseline pt lives in SNF, mostly verbal, ambulates w/o assistance, able to feed himself. No known h/o dysphagia. CT head positive for subdural hematoma with right midline shift.  Neurosurgery was consulted and felt that this was a subacute or chronic change.      SLP Plan  Continue with current plan of care       Recommendations  Diet recommendations: Thin liquid;Dysphagia 1 (puree) Liquids provided via: Cup;Straw;Teaspoon Medication Administration: Crushed with puree Supervision: Full supervision/cueing for compensatory strategies;Trained caregiver to feed patient;Staff to assist with self feeding Compensations: Slow rate;Small sips/bites;Monitor for anterior loss;Follow solids with liquid Postural Changes and/or Swallow Maneuvers: Seated upright 90 degrees;Upright 30-60 min after meal                Oral Care Recommendations: Oral care QID Follow up Recommendations: Skilled Nursing facility;24 hour supervision/assistance SLP Visit Diagnosis: Dysphagia, oropharyngeal phase (R13.12) Plan: Continue with current plan of care       [place], M.Ed., CCC-SLP Speech Therapy Acute Rehabilitation [place] 03/11/2019, 12:16 PM

## 2019-03-11 NOTE — Plan of Care (Signed)
  Problem: Nutrition: Goal: Adequate nutrition will be maintained Outcome: Not Progressing   

## 2019-03-11 NOTE — Plan of Care (Signed)
  Problem: Education: Goal: Knowledge of General Education information will improve Description Including pain rating scale, medication(s)/side effects and non-pharmacologic comfort measures Outcome: Progressing   

## 2019-03-11 NOTE — TOC Initial Note (Addendum)
Transition of Care [facility]) - Initial/Assessment Note    Patient Details  Name: [name] MRN: [id number] Date of Birth: 1950-12-06  [facility] of [facility]) CM/SW Contact:    Sable Feil, LCSW Phone Number: 03/11/2019, 2:27 PM  Clinical Narrative:  Remigio Eisenmenger (contact listed in chart - [phone] in chart) regarding patient. Ms. Huel Cote reported that patient has been with her since 1999-2000 at her family care home. Explained recommendation for ST rehab and she in agreement, stating that he must be able to ambulate to return to her facility. Ms. Huel Cote added that she would prefer a facility in [place] and [facility] would be her preference. Ms. Huel Cote reported that patient has a cousin and brother and she has updated them regarding patient being in the hospital. CSW informed by Ms. Rucker that she talked with Lanelle Bal this morning and provided her with the contact information for his brother and cousin.  CSW talked later with Lanelle Bal, and she reported putting in information in [place], but could not locate it.  Call made to Ms. Rucker (2:18 pm) to get family contact information, however her VM is full. CSW will continue trying to reach Ms. Huel Cote for patient's brother/cousin contact information.    Talked with Ms. Huel Cote again and was provided with the brother and cousins names and contact information:  *Brother - Reeder Brisby - [phone] [facility], [place]) [place] - [phone] [place], [place])  4:45 pm - Talked with Mr. Taelyn Nemes regarding patient and the recommendation of [facility] re[facility]. The facility search process and ST rehab explained and Mr. Keech in agreement. When asked, brother was in agreement with Ms. Huel Cote making the decision on which SNF he will go to, due to her experience and knowledge of the facilities in her area. Mr. Weidinger confirmed that his brother has been with Ms. Huel Cote for a long  time and added that at one time he considered bringing his brother to live with him, but later decided to leave him where he is as he likes it with Ms. Huel Cote. Brother advised that he will be informed when his brother is ready for discharge and where he is going.       Expected Discharge Plan: Skilled Nursing Facility Barriers to Discharge: Continued Medical Work up  [employer] for [facility] QM#[id number]   Patient Goals and CMS Choice Patient states their goals for this hospitalization and ongoing recovery are:: Talked with Ms, Huel Cote regarding patient's medical status and will also follow-up with patient's brother once contact information can be obtained from Ms. Rucker.      Expected Discharge Plan and Services Expected Discharge Plan: [place] In-house Referral: Clinical Social Work     Living arrangements for the past 2 months: Assisted Living Facility([facility] in [place])                                    Prior Living Arrangements/Services Living arrangements for the past 2 months: Assisted Living Facility([facility] in [place]) Lives with:: Facility Resident Patient language and need for interpreter reviewed:: No Do you feel safe going back to the place where you live?: No   Ms. Huel Cote is in agreement with SNF for rehab if needed. CSW attempting to get brother's contact information.  Need for Family Participation in Patient Care: Yes (Comment) Care giver support system in place?: Yes (comment)  Activities of Daily Living      Permission Sought/Granted Permission sought to share information with : Other (comment) Permission granted to share information with : No(Patient disoriented X4 per medical record)              Emotional Assessment Appearance:: Other (Comment Required(Did not visit with patient due to orientation status) Attitude/Demeanor/Rapport: Unable to Assess Affect (typically  observed): Unable to Assess   Alcohol / Substance Use: Tobacco Use, Alcohol Use, Illicit Drugs(Per H&P patient smokes and does not drink alcohol or use illicit drugs) Psych Involvement: No (comment)  Admission diagnosis:  Subdural hematoma (HCC) [S06.5X9A] Supratherapeutic INR [R79.1] Hypotension [I95.9] Hypothermia, initial encounter [T68.XXXA] Anemia, unspecified type [D64.9] Sepsis with encephalopathy and septic shock, due to unspecified organism ([place]) [A41.9, R65.21, G93.40] Encephalopathy [G93.40] Patient Active Problem List   Diagnosis Date Noted  . Encephalopathy 03/09/2019  . Pressure injury of skin 03/09/2019  . Subdural hematoma ([place])   . Hypotension 03/08/2019  . Sepsis ([place]) 02/10/2019  . Sepsis secondary to UTI ([place]) 11/28/2018  . DVT (deep venous thrombosis) ([place]) 11/28/2018  . AKI (acute kidney injury) ([place]) 11/28/2018  . Cellulitis 10/13/2018  . Schizophrenia ([place]) 10/13/2018  . Parkinson disease ([place]) 10/13/2018  . Dementia ([place]) 10/13/2018  . Encounter for screening colonoscopy 01/30/2015   PCP:  Rosita Fire, MD Pharmacy:   [facility], [place] [address] Phone: [phone] Fax: [phone]     Social Determinants of Health (SDOH) Interventions  No SDOH interventions needed at this time  Readmission Risk Interventions Readmission Risk Prevention Plan 02/11/2019  Transportation Screening Complete  Medication Review (RN Care Manager) Complete  Palliative Care Screening Not Applicable  Some recent data might be hidden

## 2019-03-12 DIAGNOSIS — R410 Disorientation, unspecified: Secondary | ICD-10-CM

## 2019-03-12 DIAGNOSIS — R627 Adult failure to thrive: Secondary | ICD-10-CM

## 2019-03-12 DIAGNOSIS — Z7189 Other specified counseling: Secondary | ICD-10-CM

## 2019-03-12 DIAGNOSIS — Z7181 Spiritual or religious counseling: Secondary | ICD-10-CM

## 2019-03-12 DIAGNOSIS — M6281 Muscle weakness (generalized): Secondary | ICD-10-CM

## 2019-03-12 DIAGNOSIS — Z789 Other specified health status: Secondary | ICD-10-CM

## 2019-03-12 DIAGNOSIS — Z515 Encounter for palliative care: Secondary | ICD-10-CM

## 2019-03-12 DIAGNOSIS — K117 Disturbances of salivary secretion: Secondary | ICD-10-CM

## 2019-03-12 LAB — CBC
HCT: 25.2 % — ABNORMAL LOW (ref 39.0–52.0)
Hemoglobin: 7.4 g/dL — ABNORMAL LOW (ref 13.0–17.0)
MCH: 26.4 pg (ref 26.0–34.0)
MCHC: 29.4 g/dL — ABNORMAL LOW (ref 30.0–36.0)
MCV: 90 fL (ref 80.0–100.0)
Platelets: 251 10*3/uL (ref 150–400)
RBC: 2.8 MIL/uL — ABNORMAL LOW (ref 4.22–5.81)
RDW: 17.2 % — ABNORMAL HIGH (ref 11.5–15.5)
WBC: 9.2 10*3/uL (ref 4.0–10.5)
nRBC: 0.5 % — ABNORMAL HIGH (ref 0.0–0.2)

## 2019-03-12 LAB — BASIC METABOLIC PANEL
Anion gap: 6 (ref 5–15)
BUN: 13 mg/dL (ref 8–23)
CO2: 27 mmol/L (ref 22–32)
Calcium: 8 mg/dL — ABNORMAL LOW (ref 8.9–10.3)
Chloride: 111 mmol/L (ref 98–111)
Creatinine, Ser: 1.38 mg/dL — ABNORMAL HIGH (ref 0.61–1.24)
GFR calc Af Amer: >60 mL/min (ref >60)
GFR calc non Af Amer: 52 mL/min — ABNORMAL LOW (ref >60)
Glucose, Bld: 78 mg/dL (ref 70–99)
Potassium: 3.7 mmol/L (ref 3.5–5.1)
Sodium: 144 mmol/L (ref 135–145)

## 2019-03-12 LAB — GLUCOSE, CAPILLARY
Glucose-Capillary: 79 mg/dL (ref 70–99)
Glucose-Capillary: 80 mg/dL (ref 70–99)
Glucose-Capillary: 83 mg/dL (ref 70–99)
Glucose-Capillary: 88 mg/dL (ref 70–99)
Glucose-Capillary: 95 mg/dL (ref 70–99)
Glucose-Capillary: 97 mg/dL (ref 70–99)

## 2019-03-12 LAB — PROTIME-INR
INR: 1.5 — ABNORMAL HIGH (ref 0.8–1.2)
Prothrombin Time: 17.8 seconds — ABNORMAL HIGH (ref 11.4–15.2)

## 2019-03-12 MED ORDER — MAGNESIUM CITRATE PO SOLN
1.0000 | Freq: Once | ORAL | Status: DC
  Filled 2019-03-12: qty 296

## 2019-03-12 MED ORDER — ENSURE ENLIVE PO LIQD
237.0000 mL | Freq: Two times a day (BID) | ORAL | Status: DC
  Administered 2019-03-13 – 2019-03-18 (×11): 237 mL via ORAL

## 2019-03-12 NOTE — Consult Note (Signed)
Consultation Note Date: 03/12/2019   Patient Name: [name]  DOB: 1950/02/28  MRN: [id number]  Age / Sex: 69 y.o., male  PCP: Rosita Fire, MD Referring Physician: Dessa Phi, DO  Reason for Consultation: Establishing goals of care  HPI/Patient Profile:  Per hospitalist note --> [name] is a 69 year old male with past medical history significant for schizophrenia, Parkinson's disease, intellectual disability, dementia, history of DVT on warfarin who presented from skilled nursing facility due to altered mental status.  At baseline, he is alert and oriented to self, mostly nonverbal, ambulates without assistance, able to feed himself.  However at the nursing facility they noted that he was unable to sit or stand, had incoherent speech and reportedly weaker on the right side.  Work-up revealed CT head positive for subdural hematoma with right midline shift.  Neurosurgery was consulted and felt that this was a subacute or chronic change.  Recommended to hold anticoagulation.  Due to sepsis and septic shock, patient was started on Levophed and broad-spectrum antibiotics, transferred to [facility] for further evaluation and management under PCCM service.  He was weaned off pressors and transferred to [facility] 1/14.  Clinical Assessment and Goals of Care: Chart reviewed. Touched base with patients bedside RN, Lanelle Bal. Evaluated patient who was noted to be alert,  nonverbal, lip-smacking, with abdominal distension present. Patient did not appear to be in any distress.   Per SW notes was able to get into contact with patients brother, Christy Sartorius. Introduced myself as a Palliative care team member and explained our role in patients current phase of care. Christy Sartorius was able to share that his brother suffered brain damage at birth as he was a large baby and the physician delivering him needed to use  forceps which caused injury. He states that his mother is originally from [place] so they are familiar and comfortable with him living in [place] at the group home he has been for the last twenty years.   Christy Sartorius visits his brother from [place] about three times a year. His father was a Environmental education officer therefore the patient has ties to the lord per our discussions. Christy Sartorius states that the patient loves eating snacks. He describes loading up his car at the market to bring him lots of goodies each time he visits. Per his description [name] also enjoys smoking an occasional cigarette. Christy Sartorius is hopeful that his brother can transition to rehabilitation to gain strength then transition back to his group home. He felt that he had a reasonably good quality of life there.  Explained to Christy Sartorius the complications of [facility] hospital stay. Asked if he and his brother had ever discussed code status before. Christy Sartorius said that they had not, we talked about each component of a code situation inclusive of CPR, intubation, shocks, pressors, and feeding tube placement. Christy Sartorius said that he would wish to pursue a trial of everything for his brother. He would wish for a supportive dialogue with the medical team and their guidance if trials of supportive measures are  not working.   Told Christy Sartorius that we will continue to follow along and offer support for [name] throughout his hospital stay.   Decision Maker: Lorette Ang [phone]   SUMMARY OF RECOMMENDATIONS   Full Code TOC consult for placement  Code Status/Advance Care Planning:  Full code  Management:  Goals of Care:                 - Patients brother would like a trial of all treatments, he realizes if they are ineffective and Dheeraj is considered to be in a vegetative state he would opt for withdrawal of care.   Failure to Thrive:  - 1:1 feedings  - Supplemental's BID  - Provide appetizing foods and frequent snacks   Delirium:                  - Delirium precautions                 - Get up during the day                 - Encourage a familiar face to remain present throughout the day                 - Keep blinds open and lights on during daylight hours                 - Minimize the use of opioids/benzodiazepines                  Constipation:                 - Magnesium citrate   Muscular Weakness:                 - PT                 - OT   Xerostomia:                 - Good oral care QShift                 - Encourage liquid intake   Spiritual:                 - Chaplain consult   Palliative Prophylaxis:   Aspiration, Bowel Regimen, Delirium Protocol, Eye Care, Frequent Pain Assessment, Oral Care and Turn Reposition  Additional Recommendations (Limitations, Scope, Preferences):  Full Scope Treatment  Psycho-social/Spiritual:   Desire for further Chaplaincy support: Yes  Additional Recommendations: Caregiving  Support/Resources  Prognosis:   Unable to determine, will be dependent upon his rehabilitation course  Discharge Planning: Plan to transition to skilled nursing facility for strengethening     Primary Diagnoses: Present on Admission: . Hypotension . Encephalopathy  I have reviewed the medical record, interviewed the patient and family, and examined the patient. The following aspects are pertinent.  Past Medical History:  Diagnosis Date  . Cellulitis   . Dementia ([place])   . Edema   . Hypertension   . Hypoglycemia   . Mental retardation   . Parkinson's disease ([place])   . Psychosis ([place])   . Renal insufficiency    Social History   Socioeconomic History  . Marital status: Widowed    Spouse name: Not on file  . Number of children: Not on file  . Years of education: Not on file  . Highest education level: Not on file  Occupational History  . Not on file  Tobacco Use  . Smoking status: Current Every Day Smoker  . Smokeless tobacco: Never Used  Substance and Sexual Activity  .  Alcohol use: No    Alcohol/week: 0.0 standard drinks  . Drug use: No  . Sexual activity: Not on file  Other Topics Concern  . Not on file  Social History Narrative  . Not on file   Social Determinants of Health   Financial Resource Strain:   . Difficulty of Paying Living Expenses: Not on file  Food Insecurity:   . Worried About Charity fundraiser in the Last Year: Not on file  . Ran Out of Food in the Last Year: Not on file  Transportation Needs:   . Lack of Transportation (Medical): Not on file  . Lack of Transportation (Non-Medical): Not on file  Physical Activity:   . Days of Exercise per Week: Not on file  . Minutes of Exercise per Session: Not on file  Stress:   . Feeling of Stress : Not on file  Social Connections:   . Frequency of Communication with Friends and Family: Not on file  . Frequency of Social Gatherings with Friends and Family: Not on file  . Attends Religious Services: Not on file  . Active Member of Clubs or Organizations: Not on file  . Attends Archivist Meetings: Not on file  . Marital Status: Not on file   Family History  Problem Relation Age of Onset  . Colon cancer Other        Unknown family history   Scheduled Meds: . amantadine  100 mg Oral BID  . busPIRone  15 mg Oral BID  . carbidopa-levodopa  2 tablet Oral QID  . Chlorhexidine Gluconate Cloth  6 each Topical Daily  . clozapine  50 mg Oral QHS  . dextrose  1 ampule Intravenous Once  . magnesium citrate  1 Bottle Oral Once  . rasagiline  1 mg Oral Daily  . simvastatin  20 mg Oral Daily   Continuous Infusions: . sodium chloride Stopped (03/08/19 2335)  . ceFEPime (MAXIPIME) IV 2 g (03/12/19 0939)   PRN Meds:.clonazePAM Medications Prior to Admission:  Prior to Admission medications   Medication Sig Start Date End Date Taking? Authorizing Provider  acetaminophen (Q-PAP) 500 MG tablet Take 1 tablet (500 mg total) by mouth every 6 (six) hours as needed for mild pain or  fever. 12/03/18  Yes Emokpae, Courage, MD  amantadine (SYMMETREL) 100 MG capsule Take 100 mg by mouth 2 (two) times daily.   Yes [provider]  aspirin EC 81 MG tablet Take 1 tablet (81 mg total) by mouth daily with breakfast. 02/13/19  Yes Emokpae, Courage, MD  busPIRone (BUSPAR) 15 MG tablet Take 1 tablet (15 mg total) by mouth 2 (two) times daily. 12/03/18  Yes Emokpae, Courage, MD  carbidopa-levodopa (SINEMET IR) 25-100 MG tablet Take 2 tablets by mouth 4 (four) times daily. 12/03/18  Yes Emokpae, Courage, MD  clonazePAM (KLONOPIN) 0.5 MG tablet Take 0.5 tablets (0.25 mg total) by mouth daily as needed for anxiety. 02/13/19  Yes Emokpae, Courage, MD  cloZAPine (CLOZARIL) 50 MG tablet Take 1 tablet (50 mg total) by mouth at bedtime. 12/03/18  Yes Emokpae, Courage, MD  Ensure (ENSURE) Take 237 mLs by mouth 2 (two) times daily. 02/13/19  Yes Emokpae, Courage, MD  magnesium oxide (MAG-OX) 400 MG tablet Take 1 tablet (400 mg total) by mouth 2 (two) times daily. 12/03/18  Yes Roxan Hockey, MD  nystatin cream (MYCOSTATIN) Apply 1 application topically 2 (two) times daily. Applied to feet   Yes [provider]  polyethylene glycol powder (GAVILAX) 17 GM/SCOOP powder Take 17 g by mouth 2 (two) times daily. 02/13/19  Yes Roxan Hockey, MD  potassium chloride SA (KLOR-CON) 20 MEQ tablet Take 1 tablet (20 mEq total) by mouth every Monday, Wednesday, and Friday. 02/14/19  Yes Emokpae, Courage, MD  psyllium (REGULOID) 0.52 g capsule Take 1.04 g by mouth every morning.   Yes [provider]  rasagiline (AZILECT) 1 MG TABS tablet Take 1 tablet (1 mg total) by mouth daily. 12/03/18  Yes Roxan Hockey, MD  torsemide (DEMADEX) 20 MG tablet Take 1 tablet (20 mg total) by mouth every Monday, Wednesday, and Friday. 02/14/19  Yes Roxan Hockey, MD  warfarin (COUMADIN) 2 MG tablet Take 1 tablet (2 mg total) by mouth every evening. 02/13/19  Yes Emokpae, Courage, MD  simvastatin  (ZOCOR) 20 MG tablet Take 20 mg by mouth daily.    [provider]   Allergies  Allergen Reactions  . Penicillins     .Did it involve swelling of the face/tongue/throat, SOB, or low BP? Unknown Did it involve sudden or severe rash/hives, skin peeling, or any reaction on the inside of your mouth or nose? Unknown Did you need to seek medical attention at a hospital or doctor's office? Unknown When did it last happen?Unknown If all above answers are "NO", may proceed with cephalosporin use.    Review of Systems  Unable to perform ROS   Physical Exam Vitals and nursing note reviewed.  Constitutional:      Comments: Eyes open, lip smacking  HENT:     Head: Normocephalic.     Nose: Nose normal.     Mouth/Throat:     Mouth: Mucous membranes are dry.  Eyes:     Pupils: Pupils are equal, round, and reactive to light.  Cardiovascular:     Rate and Rhythm: Normal rate.  Pulmonary:     Effort: Pulmonary effort is normal.  Abdominal:     General: There is distension.  Musculoskeletal:     Right lower leg: Edema present.     Left lower leg: Edema present.     Comments: Decreased muscle tone  Skin:    General: Skin is warm and dry.     Capillary Refill: Capillary refill takes less than 2 seconds.  Neurological:     Mental Status: He is disoriented.     Vital Signs: BP 111/63 (BP Location: Left Arm)   Pulse 69   Temp 98.8 F (37.1 C) (Oral)   Resp 18   Wt 74.4 kg   SpO2 96%   BMI 22.25 kg/m  Pain Scale: Faces   Pain Score: 0-No pain  SpO2: SpO2: 96 % O2 Device:SpO2: 96 % O2 Flow Rate: .   IO: Intake/output summary:   Intake/Output Summary (Last 24 hours) at 03/12/2019 1637 Last data filed at 03/12/2019 1539 Gross per 24 hour  Intake 1940.53 ml  Output 1200 ml  Net 740.53 ml   LBM: Last BM Date: (PTA) Baseline Weight: Weight: 71.1 kg Most recent weight: Weight: 74.4 kg     Palliative Assessment/Data: 30%   Time In: 1600 Time Out: 1715 Time  Total: 75 Greater than 50%  of this time was spent counseling and coordinating care related to the above assessment and plan.  Signed by: Rosezella Rumpf, NP   Please contact Palliative Medicine Team phone at [phone]  for questions and concerns.  For individual provider: See Shea Evans

## 2019-03-12 NOTE — Progress Notes (Signed)
  Speech Language Pathology Treatment: Dysphagia  Patient Details Name: [name] MRN: [id number] DOB: 03/19/1950 Today's Date: 03/12/2019 Time: 5625-6389 SLP Time Calculation (min) (ACUTE ONLY): 18 min  Assessment / Plan / Recommendation Clinical Impression  Pt was seen for skilled ST targeting diet tolerance and diagnostic treatment.  Pt was encountered awake/alert with part of lunch meal tray in front of him.  RN reported that pt was tolerating his current diet of Dysphagia 1 (puree) solids and thin liquids without difficulty.  Pt was seen with trials of thin liquid, nectar-thick liquid, and puree (4oz).  He was noted to be impulsive and he required verbal and tactile cues to limit bolus size and slow rate of intake with liquid consumption.  Pt exhibited frequent eructation and delayed throat clearing with thin liquid.  Nectar-thick liquid was tried with similar results.  Small single sips helped to decreased frequency of eructation and throat clearing.  Suspect delayed swallow initiation with all liquid trials.  Pt exhibited prolonged AP transport and trace R anterior labial spillage with puree trials.  No clinical s/sx of aspiration were observed with any puree trials.  Pt with mild oral residue following completion of PO trials that was subsequently suctioned from the pt's oral cavity.  Recommend continuation of Dysphagia 1 (puree) solids and thin liquids with full supervision to assist with feeding and to cue for strict use of compensatory strategies.  Additionally recommend consideration of Protonix or another PPI.  SLP will continue to f/u per POC.     HPI HPI: Mr Jeremiah Pafford. Coleman, 68y/m, presented to ED with incoherent speech, reportedly weaker on the right side and unable to sit or stand. PMH of schizophrenia, Parkinson's disease, intellectual disability, dementia and h/o DVT. At baseline pt lives in SNF, mostly verbal, ambulates w/o assistance, able to feed himself. No known  h/o dysphagia. CT head positive for subdural hematoma with right midline shift.  Neurosurgery was consulted and felt that this was a subacute or chronic change.      SLP Plan  Continue with current plan of care       Recommendations  Diet recommendations: Dysphagia 1 (puree);Thin liquid Liquids provided via: Cup;Straw;Teaspoon Medication Administration: Crushed with puree Supervision: Full supervision/cueing for compensatory strategies;Trained caregiver to feed patient;Staff to assist with self feeding Compensations: Slow rate;Small sips/bites;Monitor for anterior loss;Follow solids with liquid Postural Changes and/or Swallow Maneuvers: Seated upright 90 degrees;Upright 30-60 min after meal                Oral Care Recommendations: Oral care QID;Staff/trained caregiver to provide oral care Follow up Recommendations: Skilled Nursing facility;24 hour supervision/assistance SLP Visit Diagnosis: Dysphagia, oropharyngeal phase (R13.12) Plan: Continue with current plan of care                      Colin Mulders M.S., [place] Office: [phone]  [place] 03/12/2019, 12:58 PM

## 2019-03-12 NOTE — Progress Notes (Deleted)
DISCHARGE NOTE HOME [facility] to be discharged Home per MD order. Discussed prescriptions and follow up appointments with the patient. Prescriptions given to patient; medication list explained in detail. Patient verbalized understanding.  Skin clean, dry and intact without evidence of skin break down, no evidence of skin tears noted. IV catheter discontinued intact. Site without signs and symptoms of complications. Dressing and pressure applied. Pt denies pain at the site currently. No complaints noted.  Patient free of lines, drains, and wounds.   An After Visit Summary (AVS) was printed and given to the patient. Patient escorted via wheelchair, and discharged home via private auto.  Paulla Fore, RN

## 2019-03-12 NOTE — NC FL2 (Signed)
[place] LEVEL OF CARE SCREENING TOOL     IDENTIFICATION  Patient Name: [name] Birthdate: 1950/07/30 Sex: male Admission Date (Current Location): 03/08/2019  [facility] and [place] Number:  Herbalist and Address:  [facility] [place] [address] [place], [place] [address]      Provider Number: [id number]  Attending Physician Name and Address:  Dessa Phi, DO  Relative Name and Phone Number:       Current Level of Care: SNF Recommended Level of Care: [place] Prior Approval Number:    Date Approved/Denied:   PASRR Number: pending  Discharge Plan: SNF    Current Diagnoses: Patient Active Problem List   Diagnosis Date Noted  . Encephalopathy 03/09/2019  . Pressure injury of skin 03/09/2019  . Subdural hematoma ([place])   . Hypotension 03/08/2019  . Sepsis ([place]) 02/10/2019  . Sepsis secondary to UTI ([place]) 11/28/2018  . DVT (deep venous thrombosis) ([place]) 11/28/2018  . AKI (acute kidney injury) ([place]) 11/28/2018  . Cellulitis 10/13/2018  . Schizophrenia ([place]) 10/13/2018  . Parkinson disease ([place]) 10/13/2018  . Dementia ([place]) 10/13/2018  . Encounter for screening colonoscopy 01/30/2015    Orientation RESPIRATION BLADDER Height & Weight     Self  Normal External catheter, Incontinent(placed 03/09/19) Weight: 164 lb 0.4 oz (74.4 kg) Height:     BEHAVIORAL SYMPTOMS/MOOD NEUROLOGICAL BOWEL NUTRITION STATUS      Incontinent Diet(DYS 1 diet, thin liquids)  AMBULATORY STATUS COMMUNICATION OF NEEDS Skin   Extensive Assist Verbally PU Stage and Appropriate Care   PU Stage 2 Dressing: (anus, foam dressing, change every 3 days)                   Personal Care Assistance Level of Assistance  Bathing, Feeding, Dressing Bathing Assistance: Maximum assistance Feeding assistance: Limited assistance Dressing Assistance: Maximum assistance     Functional Limitations Info  Sight, Hearing, Speech  Sight Info: Adequate Hearing Info: Adequate Speech Info: Adequate    SPECIAL CARE FACTORS FREQUENCY  PT (By licensed PT), OT (By licensed OT)     PT Frequency: 5x OT Frequency: 5x            Contractures Contractures Info: Not present    Additional Factors Info  Code Status, Allergies Code Status Info: Full Code Allergies Info: Penicillins           Current Medications (03/12/2019):  This is the current hospital active medication list Current Facility-Administered Medications  Medication Dose Route Frequency Provider Last Rate Last Admin  . 0.9 %  sodium chloride infusion  250 mL Intravenous Continuous Fawze, Mina A, PA-C   Stopped at 03/08/19 2335  . amantadine (SYMMETREL) capsule 100 mg  100 mg Oral BID Kathi Ludwig, MD   100 mg at 03/12/19 0936  . busPIRone (BUSPAR) tablet 15 mg  15 mg Oral BID Dessa Phi, DO   15 mg at 03/12/19 0936  . carbidopa-levodopa (SINEMET IR) 25-100 MG per tablet immediate release 2 tablet  2 tablet Oral QID Kathi Ludwig, MD   2 tablet at 03/12/19 0936  . ceFEPIme (MAXIPIME) 2 g in sodium chloride 0.9 % 100 mL IVPB  2 g Intravenous Q12H Nat Christen, MD 200 mL/hr at 03/12/19 0939 2 g at 03/12/19 0939  . Chlorhexidine Gluconate Cloth 2 % PADS 6 each  6 each Topical Daily Tanda Rockers, MD   6 each at 03/10/19 1000  . clonazePAM (KLONOPIN) tablet 0.25 mg  0.25 mg Oral Daily PRN Dessa Phi, DO      . cloZAPine (CLOZARIL) tablet 50 mg  50 mg Oral QHS Dessa Phi, DO   50 mg at 03/11/19 2337  . dextrose 5 %-0.45 % sodium chloride infusion   Intravenous Continuous Dessa Phi, DO 75 mL/hr at 03/11/19 2350 New Bag at 03/11/19 2350  . dextrose 50 % solution 50 mL  1 ampule Intravenous Once Fawze, Mina A, PA-C      . rasagiline (AZILECT) tablet 1 mg  1 mg Oral Daily Dessa Phi, DO   1 mg at 03/12/19 0936  . simvastatin (ZOCOR) tablet 20 mg  20 mg Oral Daily Dessa Phi, DO   20 mg at 03/12/19 1761     Discharge  Medications: Please see discharge summary for a list of discharge medications.  Relevant Imaging Results:  Relevant Lab Results:   Additional Information [id number]  Eileen Stanford, LCSW

## 2019-03-12 NOTE — Progress Notes (Signed)
PROGRESS NOTE    [name]  RCV:[id number] DOB: Sep 26, 1950 DOA: 03/08/2019 PCP: Rosita Fire, MD     Brief Narrative:  [name] is a 69 year old male with past medical history significant for schizophrenia, Parkinson's disease, intellectual disability, dementia, history of DVT on warfarin who presented from skilled nursing facility due to altered mental status.  At baseline, he is alert and oriented to self, mostly nonverbal, ambulates without assistance, able to feed himself.  However at the nursing facility they noted that he was unable to sit or stand, had incoherent speech and reportedly weaker on the right side.  Work-up revealed CT head positive for subdural hematoma with right midline shift.  Neurosurgery was consulted and felt that this was a subacute or chronic change.  Recommended to hold anticoagulation.  Due to sepsis and septic shock, patient was started on Levophed and broad-spectrum antibiotics, transferred to [facility] for further evaluation and management under PCCM service.  He was weaned off pressors and transferred to [facility] 1/14.  New events last 24 hours / Subjective: Patient is alert but not interactive on my examination.  Patient did have documented adequate p.o. intake yesterday and was able to take p.o. medications yesterday as well.  Assessment & Plan:   Active Problems:   Hypotension   Encephalopathy   Pressure injury of skin   Subdural hematoma (HCC)   Septic shock secondary to suspected right pneumonia, sepsis present on admission -Influenza, Covid negative -Strep pneumo antigen negative -Weaned off Levophed and blood pressure remained stable -MRSA swab negative, stop vancomycin -Blood cultures negative to date -CXR: Subtle asymmetric opacity in the right upper lobe is new from previous exam. Cannot rule out early pneumonia. Followup PA and lateral chest X-ray is recommended in 3-4 weeks following trial of antibiotic therapy  to ensure resolution and exclude underlying malignancy  -Continue cefepime  Acute anemia, without evidence of blood loss -Hemoglobin on presentation 5.5 -FOBT negative -Iron studies does not indicate iron deficiency anemia, vitamin B12 within normal -Patient given 2 unit pRBC and IV vitamin K -Hemoglobin slowly trending down 7.4, continue to monitor  Acute metabolic encephalopathy -Improved but not quite back to baseline.  At baseline, patient is largely nonverbal, only mumbles incoherently, but is able to feed himself and walk.  Resides at a group home.  CKD stage 3a -Baseline creatinine 1.4 -Stable   Left chronic subdural hematoma -CT head 1/12: Interval development of a thin intermediate density subdural hematoma overlying the left cerebral convexity measuring 9 mm in thickness. Mild mass effect upon the underlying left cerebral hemisphere. 2 mm rightward midline shift. -Repeat CT head 1/13: 9 mm extra-axial fluid collection left is larger compared with yesterday now measuring up to 9 mm. Findings compatible with chronic subdural hematoma. Slight increase in midline shift to the right now 3 mm. -Neurosurgery recommended nonsurgical management  -Stop warfarin  History of right lower extremity DVT in September 2020 -Thought to be provoked in setting of immobilization and treatment for cellulitis in August 2020 -Stop warfarin  History of schizophrenia, Parkinson's disease -Continue amantadine, BuSpar, Sinemet, Klonopin, Clozaril, rasagiline  Hyperlipidemia -Continue Zocor  Goals of care -[name] is the contact at the group home where patient has resided over the past 20 years.  He does have a brother and a cousin that group home is in contact with intermittently.  Discussed that patient has not been back to his baseline, and not eating or ambulating much.  If patient does not improve in  terms of p.o. intake, patient has a poor prognosis.  Consulted palliative care.     In  agreement with assessment of the pressure ulcer as below:  Pressure Injury 03/09/19 Anus Mid Stage 2 -  Partial thickness loss of dermis presenting as a shallow open injury with a red, pink wound bed without slough. (Active)  03/09/19 0323  Location: Anus  Location Orientation: Mid  Staging: Stage 2 -  Partial thickness loss of dermis presenting as a shallow open injury with a red, pink wound bed without slough.  Wound Description (Comments):   Present on Admission: Yes        DVT prophylaxis: SCD Code Status: Full code Family Communication: None at bedside Disposition Plan: Pending further improvement in his clinical status including continuing IV antibiotics.  Palliative care medicine consulted.  SNF recommended by PT   Consultants:   PCCM admission  Palliative care medicine   Antimicrobials:  Anti-infectives (From admission, onward)   Start     Dose/Rate Route Frequency Ordered Stop   03/09/19 1500  vancomycin (VANCOCIN) IVPB 1000 mg/200 mL premix  Status:  Discontinued     1,000 mg 200 mL/hr over 60 Minutes Intravenous Every 24 hours 03/08/19 1436 03/10/19 0714   03/09/19 0000  ceFEPIme (MAXIPIME) 2 g in sodium chloride 0.9 % 100 mL IVPB     2 g 200 mL/hr over 30 Minutes Intravenous Every 12 hours 03/08/19 1437     03/08/19 1500  vancomycin (VANCOREADY) IVPB 500 mg/100 mL     500 mg 100 mL/hr over 60 Minutes Intravenous  Once 03/08/19 1435 03/08/19 1828   03/08/19 1230  vancomycin (VANCOCIN) IVPB 1000 mg/200 mL premix     1,000 mg 200 mL/hr over 60 Minutes Intravenous  Once 03/08/19 1229 03/08/19 1500   03/08/19 1230  ceFEPIme (MAXIPIME) 2 g in sodium chloride 0.9 % 100 mL IVPB     2 g 200 mL/hr over 30 Minutes Intravenous  Once 03/08/19 1229 03/08/19 1330   03/08/19 1230  azithromycin (ZITHROMAX) 500 mg in sodium chloride 0.9 % 250 mL IVPB     500 mg 250 mL/hr over 60 Minutes Intravenous  Once 03/08/19 1229 03/08/19 1402       Objective: Vitals:   03/11/19  2021 03/11/19 2022 03/12/19 0536 03/12/19 0948  BP:  123/73 105/66 111/63  Pulse:  63 66 69  Resp:  20 18 18   Temp:  98.1 F (36.7 C) 98.3 F (36.8 C) 98.8 F (37.1 C)  TempSrc:  Oral Oral Oral  SpO2:  99% 97% 96%  Weight: 74.4 kg       Intake/Output Summary (Last 24 hours) at 03/12/2019 1404 Last data filed at 03/12/2019 0925 Gross per 24 hour  Intake 2320.53 ml  Output 925 ml  Net 1395.53 ml   Filed Weights   03/10/19 0500 03/10/19 2101 03/11/19 2021  Weight: 70.2 kg 71.5 kg 74.4 kg     Examination: General exam: Appears calm and comfortable  Respiratory system: Clear to auscultation anteriorly. Respiratory effort normal. Cardiovascular system: S1 & S2 heard, RRR. No pedal edema. Gastrointestinal system: Abdomen is nondistended, soft and nontender. Normal bowel sounds heard. Central nervous system: Alert, nonverbal  Extremities: Symmetric in appearance bilaterally  Skin: No rashes, lesions or ulcers on exposed skin    Data Reviewed: I have personally reviewed following labs and imaging studies  CBC: Recent Labs  Lab 03/08/19 1224 03/09/19 0038 03/09/19 1106 03/09/19 1801 03/09/19 2355 03/11/19 0339 03/12/19 0420  WBC  4.6   < > 10.1 12.2* 13.5* 9.7 9.2  NEUTROABS 3.5  --   --   --   --   --   --   HGB 5.5*   < > 8.5* 9.6* 9.0* 8.0* 7.4*  HCT 20.2*   < > 28.6* 31.8* 30.9* 27.1* 25.2*  MCV 93.5   < > 89.9 90.1 92.2 91.6 90.0  PLT 310   < > 335 326 313 291 251   < > = values in this interval not displayed.   Basic Metabolic Panel: Recent Labs  Lab 03/09/19 0038 03/09/19 0038 03/09/19 0439 03/09/19 0514 03/09/19 2355 03/11/19 0339 03/12/19 0420  NA 142   < > 145 144 145 148* 144  K 4.4   < > 4.2 4.3 4.5 4.1 3.7  CL 110  --   --  109 114* 112* 111  CO2 27  --   --  27 23 26 27   GLUCOSE 83  --   --  119* 89 74 78  BUN 23  --   --  21 15 11 13   CREATININE 1.62*  --   --  1.53* 1.49* 1.42* 1.38*  CALCIUM 8.0*  --   --  8.4* 8.4* 8.5* 8.0*  MG  --   --    --  2.8*  --   --   --   PHOS  --   --   --  3.1  --   --   --    < > = values in this interval not displayed.   GFR: Estimated Creatinine Clearance: 53.2 mL/min (A) (by C-G formula based on SCr of 1.38 mg/dL (H)). Liver Function Tests: Recent Labs  Lab 03/08/19 1224  AST 31  ALT 7  ALKPHOS 90  BILITOT 1.2  PROT 6.2*  ALBUMIN 2.8*   No results for input(s): LIPASE, AMYLASE in the last 168 hours. Recent Labs  Lab 03/09/19 0514  AMMONIA 38*   Coagulation Profile: Recent Labs  Lab 03/08/19 1224 03/09/19 0514 03/10/19 0915 03/11/19 0339 03/12/19 0420  INR 3.2* 2.7* 3.9* 1.9* 1.5*   Cardiac Enzymes: No results for input(s): CKTOTAL, CKMB, CKMBINDEX, TROPONINI in the last 168 hours. BNP (last 3 results) No results for input(s): PROBNP in the last 8760 hours. HbA1C: No results for input(s): HGBA1C in the last 72 hours. CBG: Recent Labs  Lab 03/11/19 2010 03/12/19 0013 03/12/19 0403 03/12/19 0733 03/12/19 1131  GLUCAP 86 97 80 95 88   Lipid Profile: No results for input(s): CHOL, HDL, LDLCALC, TRIG, CHOLHDL, LDLDIRECT in the last 72 hours. Thyroid Function Tests: No results for input(s): TSH, T4TOTAL, FREET4, T3FREE, THYROIDAB in the last 72 hours. Anemia Panel: No results for input(s): VITAMINB12, FOLATE, FERRITIN, TIBC, IRON, RETICCTPCT in the last 72 hours. Sepsis Labs: Recent Labs  Lab 03/08/19 1149 03/08/19 1224 03/08/19 1322 03/10/19 0533  PROCALCITON  --  <0.10  --  0.25  LATICACIDVEN 1.2  --  1.1  --     Recent Results (from the past 240 hour(s))  Culture, blood (Routine x 2)     Status: None (Preliminary result)   Collection Time: 03/08/19 12:23 PM   Specimen: BLOOD RIGHT WRIST  Result Value Ref Range Status   Specimen Description BLOOD RIGHT WRIST  Final   Special Requests   Final    BOTTLES DRAWN AEROBIC AND ANAEROBIC Blood Culture adequate volume   Culture   Final    NO GROWTH 4 DAYS Performed at [facility]  [facility], [address]  [place], [place] [address]    Report Status PENDING  Incomplete  Culture, blood (Routine x 2)     Status: None (Preliminary result)   Collection Time: 03/08/19 12:24 PM   Specimen: BLOOD RIGHT FOREARM  Result Value Ref Range Status   Specimen Description BLOOD RIGHT FOREARM  Final   Special Requests   Final    BOTTLES DRAWN AEROBIC AND ANAEROBIC Blood Culture adequate volume   Culture   Final    NO GROWTH 4 DAYS Performed at [facility], [address], [place], [place] [address]    Report Status PENDING  Incomplete  MRSA PCR Screening     Status: None   Collection Time: 03/08/19  2:35 PM   Specimen: Nasal Mucosa; Nasopharyngeal  Result Value Ref Range Status   MRSA by PCR NEGATIVE NEGATIVE Final    Comment:        The GeneXpert MRSA Assay (FDA approved for NASAL specimens only), is one component of a comprehensive MRSA colonization surveillance program. It is not intended to diagnose MRSA infection nor to guide or monitor treatment for MRSA infections. Performed at [facility], [address] [place], [place] [address]   Respiratory Panel by RT PCR (Flu A&B, Covid) - Nasopharyngeal Swab     Status: None   Collection Time: 03/08/19  2:55 PM   Specimen: Nasopharyngeal Swab  Result Value Ref Range Status   SARS Coronavirus 2 by RT PCR NEGATIVE NEGATIVE Final    Comment: (NOTE) SARS-CoV-2 target nucleic acids are NOT DETECTED. The SARS-CoV-2 RNA is generally detectable in upper respiratoy specimens during the acute phase of infection. The lowest concentration of SARS-CoV-2 viral copies this assay can detect is 131 copies/mL. A negative result does not preclude SARS-Cov-2 infection and should not be used as the sole basis for treatment or other patient management decisions. A negative result may occur with  improper specimen collection/handling, submission of specimen other than nasopharyngeal swab, presence of viral mutation(s) within the areas targeted by this assay, and  inadequate number of viral copies (<131 copies/mL). A negative result must be combined with clinical observations, patient history, and epidemiological information. The expected result is Negative. Fact Sheet for Patients:  PinkCheek.be Fact Sheet for Healthcare Providers:  GravelBags.it This test is not yet ap proved or cleared by the [place] FDA and  has been authorized for detection and/or diagnosis of SARS-CoV-2 by FDA under an Emergency Use Authorization (EUA). This EUA will remain  in effect (meaning this test can be used) for the duration of the COVID-19 declaration under Section 564(b)(1) of the Act, 21 U.S.C. section 360bbb-3(b)(1), unless the authorization is terminated or revoked sooner.    Influenza A by PCR NEGATIVE NEGATIVE Final   Influenza B by PCR NEGATIVE NEGATIVE Final    Comment: (NOTE) The Xpert Xpress SARS-CoV-2/FLU/RSV assay is intended as an aid in  the diagnosis of influenza from Nasopharyngeal swab specimens and  should not be used as a sole basis for treatment. Nasal washings and  aspirates are unacceptable for Xpert Xpress SARS-CoV-2/FLU/RSV  testing. Fact Sheet for Patients: PinkCheek.be Fact Sheet for Healthcare Providers: GravelBags.it This test is not yet approved or cleared by the [place] FDA and  has been authorized for detection and/or diagnosis of SARS-CoV-2 by  FDA under an Emergency Use Authorization (EUA). This EUA will remain  in effect (meaning this test can be used) for the duration of the  Covid-19 declaration under Section 564(b)(1) of  the Act, 21  U.S.C. section 360bbb-3(b)(1), unless the authorization is  terminated or revoked. Performed at [facility], [address] [place], [place] [address]   MRSA PCR Screening     Status: None   Collection Time: 03/09/19  3:04 AM   Specimen: Nasopharyngeal  Result  Value Ref Range Status   MRSA by PCR NEGATIVE NEGATIVE Final    Comment:        The GeneXpert MRSA Assay (FDA approved for NASAL specimens only), is one component of a comprehensive MRSA colonization surveillance program. It is not intended to diagnose MRSA infection nor to guide or monitor treatment for MRSA infections. Performed at [facility], [place] [address] [place], [place] [address]       Radiology Studies: No results found.    Scheduled Meds: . amantadine  100 mg Oral BID  . busPIRone  15 mg Oral BID  . carbidopa-levodopa  2 tablet Oral QID  . Chlorhexidine Gluconate Cloth  6 each Topical Daily  . clozapine  50 mg Oral QHS  . dextrose  1 ampule Intravenous Once  . rasagiline  1 mg Oral Daily  . simvastatin  20 mg Oral Daily   Continuous Infusions: . sodium chloride Stopped (03/08/19 2335)  . ceFEPime (MAXIPIME) IV 2 g (03/12/19 0939)  . dextrose 5 % and 0.45% NaCl 75 mL/hr at 03/11/19 2350     LOS: 4 days      Time spent: 25 minutes   Dessa Phi, DO [facility] 03/12/2019, 2:04 PM   Available via Epic secure chat 7am-7pm After these hours, please refer to coverage provider listed on [email or web address]

## 2019-03-13 DIAGNOSIS — J181 Lobar pneumonia, unspecified organism: Secondary | ICD-10-CM

## 2019-03-13 LAB — CBC
HCT: 27.3 % — ABNORMAL LOW (ref 39.0–52.0)
Hemoglobin: 7.9 g/dL — ABNORMAL LOW (ref 13.0–17.0)
MCH: 26.4 pg (ref 26.0–34.0)
MCHC: 28.9 g/dL — ABNORMAL LOW (ref 30.0–36.0)
MCV: 91.3 fL (ref 80.0–100.0)
Platelets: 282 10*3/uL (ref 150–400)
RBC: 2.99 MIL/uL — ABNORMAL LOW (ref 4.22–5.81)
RDW: 17.2 % — ABNORMAL HIGH (ref 11.5–15.5)
WBC: 10.6 10*3/uL — ABNORMAL HIGH (ref 4.0–10.5)
nRBC: 0.2 % (ref 0.0–0.2)

## 2019-03-13 LAB — PROTIME-INR
INR: 1.4 — ABNORMAL HIGH (ref 0.8–1.2)
Prothrombin Time: 17 seconds — ABNORMAL HIGH (ref 11.4–15.2)

## 2019-03-13 LAB — GLUCOSE, CAPILLARY
Glucose-Capillary: 70 mg/dL (ref 70–99)
Glucose-Capillary: 155 mg/dL — ABNORMAL HIGH (ref 70–99)
Glucose-Capillary: 112 mg/dL — ABNORMAL HIGH (ref 70–99)

## 2019-03-13 NOTE — Progress Notes (Signed)
PROGRESS NOTE    [name]  ACZ:[id number] DOB: 10/06/1950 DOA: 03/08/2019 PCP: Rosita Fire, MD     Brief Narrative:  [name] is a 69 year old male with past medical history significant for schizophrenia, Parkinson's disease, intellectual disability, dementia, history of DVT on warfarin who presented from skilled nursing facility due to altered mental status.  At baseline, he is alert and oriented to self, mostly nonverbal, ambulates without assistance, able to feed himself.  However at the nursing facility they noted that he was unable to sit or stand, had incoherent speech and reportedly weaker on the right side.  Work-up revealed CT head positive for subdural hematoma with right midline shift.  Neurosurgery was consulted and felt that this was a subacute or chronic change.  Recommended to hold anticoagulation.  Due to sepsis and septic shock, patient was started on Levophed and broad-spectrum antibiotics, transferred to [facility] for further evaluation and management under PCCM service.  He was weaned off pressors and transferred to [facility] 1/14.  New events last 24 hours / Subjective: Finished breakfast this morning, mentation seems to be what is his baseline.  Assessment & Plan:   Active Problems:   Hypotension   Encephalopathy   Pressure injury of skin   Subdural hematoma (HCC)   Palliative care by specialist   Goals of care, counseling/discussion   Delirium   Muscular weakness   Failure to thrive in adult   Full code status   Xerostomia   Spiritual or religious counseling   Septic shock secondary to suspected right pneumonia, sepsis present on admission -Influenza, Covid negative -Strep pneumo antigen negative -Weaned off Levophed and blood pressure remained stable -MRSA swab negative, stop vancomycin -Blood cultures negative to date -CXR: Subtle asymmetric opacity in the right upper lobe is new from previous exam. Cannot rule out early  pneumonia. Followup PA and lateral chest X-ray is recommended in 3-4 weeks following trial of antibiotic therapy to ensure resolution and exclude underlying malignancy  -Continue cefepime 7 days  Acute anemia, without evidence of blood loss -Hemoglobin on presentation 5.5 -FOBT negative -Iron studies does not indicate iron deficiency anemia, vitamin B12 within normal -Patient given 2 unit pRBC and IV vitamin K -Hemoglobin improved today 7.9  Acute metabolic encephalopathy -Improved.  At baseline, patient is largely nonverbal, only mumbles incoherently, but is able to feed himself and walk.  Resides at a group home.  CKD stage 3a -Baseline creatinine 1.4 -Stable   Left chronic subdural hematoma -CT head 1/12: Interval development of a thin intermediate density subdural hematoma overlying the left cerebral convexity measuring 9 mm in thickness. Mild mass effect upon the underlying left cerebral hemisphere. 2 mm rightward midline shift. -Repeat CT head 1/13: 9 mm extra-axial fluid collection left is larger compared with yesterday now measuring up to 9 mm. Findings compatible with chronic subdural hematoma. Slight increase in midline shift to the right now 3 mm. -Neurosurgery recommended nonsurgical management  -Stop warfarin  History of right lower extremity DVT in September 2020 -Thought to be provoked in setting of immobilization and treatment for cellulitis in August 2020 -Stop warfarin  History of schizophrenia, Parkinson's disease -Continue amantadine, BuSpar, Sinemet, Klonopin, Clozaril, rasagiline  Hyperlipidemia -Continue Zocor    In agreement with assessment of the pressure ulcer as below:  Pressure Injury 03/09/19 Anus Mid Stage 2 -  Partial thickness loss of dermis presenting as a shallow open injury with a red, pink wound bed without slough. (Active)  03/09/19 1093  Location: Anus  Location Orientation: Mid  Staging: Stage 2 -  Partial thickness loss of dermis  presenting as a shallow open injury with a red, pink wound bed without slough.  Wound Description (Comments):   Present on Admission: Yes        DVT prophylaxis: SCD Code Status: Full code Family Communication: None at bedside Disposition Plan: SNF placement pending   Consultants:   PCCM admission  Palliative care medicine   Antimicrobials:  Anti-infectives (From admission, onward)   Start     Dose/Rate Route Frequency Ordered Stop   03/09/19 1500  vancomycin (VANCOCIN) IVPB 1000 mg/200 mL premix  Status:  Discontinued     1,000 mg 200 mL/hr over 60 Minutes Intravenous Every 24 hours 03/08/19 1436 03/10/19 0714   03/09/19 0000  ceFEPIme (MAXIPIME) 2 g in sodium chloride 0.9 % 100 mL IVPB     2 g 200 mL/hr over 30 Minutes Intravenous Every 12 hours 03/08/19 1437 03/15/19 2159   03/08/19 1500  vancomycin (VANCOREADY) IVPB 500 mg/100 mL     500 mg 100 mL/hr over 60 Minutes Intravenous  Once 03/08/19 1435 03/08/19 1828   03/08/19 1230  vancomycin (VANCOCIN) IVPB 1000 mg/200 mL premix     1,000 mg 200 mL/hr over 60 Minutes Intravenous  Once 03/08/19 1229 03/08/19 1500   03/08/19 1230  ceFEPIme (MAXIPIME) 2 g in sodium chloride 0.9 % 100 mL IVPB     2 g 200 mL/hr over 30 Minutes Intravenous  Once 03/08/19 1229 03/08/19 1330   03/08/19 1230  azithromycin (ZITHROMAX) 500 mg in sodium chloride 0.9 % 250 mL IVPB     500 mg 250 mL/hr over 60 Minutes Intravenous  Once 03/08/19 1229 03/08/19 1402       Objective: Vitals:   03/12/19 2118 03/13/19 0500 03/13/19 0552 03/13/19 0941  BP: 140/75  128/68 124/62  Pulse: 64  60 66  Resp: 18  18 18   Temp: 98 F (36.7 C)  98 F (36.7 C) 98.2 F (36.8 C)  TempSrc: Oral  Oral Oral  SpO2: 100%  98% 98%  Weight:  72.5 kg      Intake/Output Summary (Last 24 hours) at 03/13/2019 1040 Last data filed at 03/13/2019 0820 Gross per 24 hour  Intake 940 ml  Output 1150 ml  Net -210 ml   Filed Weights   03/10/19 2101 03/11/19 2021  03/13/19 0500  Weight: 71.5 kg 74.4 kg 72.5 kg    Examination: General exam: Appears calm and comfortable  Respiratory system: Clear to auscultation. Respiratory effort normal. Cardiovascular system: S1 & S2 heard, RRR. No pedal edema. Gastrointestinal system: Abdomen is nondistended, soft and nontender. Normal bowel sounds heard. Central nervous system: Alert, nonverbal Extremities: Symmetric in appearance bilaterally  Skin: No rashes, lesions or ulcers on exposed skin    Data Reviewed: I have personally reviewed following labs and imaging studies  CBC: Recent Labs  Lab 03/08/19 1224 03/09/19 0038 03/09/19 1801 03/09/19 2355 03/11/19 0339 03/12/19 0420 03/13/19 0429  WBC 4.6   < > 12.2* 13.5* 9.7 9.2 10.6*  NEUTROABS 3.5  --   --   --   --   --   --   HGB 5.5*   < > 9.6* 9.0* 8.0* 7.4* 7.9*  HCT 20.2*   < > 31.8* 30.9* 27.1* 25.2* 27.3*  MCV 93.5   < > 90.1 92.2 91.6 90.0 91.3  PLT 310   < > 326 313 291 251 282   < > =  values in this interval not displayed.   Basic Metabolic Panel: Recent Labs  Lab 03/09/19 0038 03/09/19 0038 03/09/19 0439 03/09/19 0514 03/09/19 2355 03/11/19 0339 03/12/19 0420  NA 142   < > 145 144 145 148* 144  K 4.4   < > 4.2 4.3 4.5 4.1 3.7  CL 110  --   --  109 114* 112* 111  CO2 27  --   --  27 23 26 27   GLUCOSE 83  --   --  119* 89 74 78  BUN 23  --   --  21 15 11 13   CREATININE 1.62*  --   --  1.53* 1.49* 1.42* 1.38*  CALCIUM 8.0*  --   --  8.4* 8.4* 8.5* 8.0*  MG  --   --   --  2.8*  --   --   --   PHOS  --   --   --  3.1  --   --   --    < > = values in this interval not displayed.   GFR: Estimated Creatinine Clearance: 51.8 mL/min (A) (by C-G formula based on SCr of 1.38 mg/dL (H)). Liver Function Tests: Recent Labs  Lab 03/08/19 1224  AST 31  ALT 7  ALKPHOS 90  BILITOT 1.2  PROT 6.2*  ALBUMIN 2.8*   No results for input(s): LIPASE, AMYLASE in the last 168 hours. Recent Labs  Lab 03/09/19 0514  AMMONIA 38*    Coagulation Profile: Recent Labs  Lab 03/09/19 0514 03/10/19 0915 03/11/19 0339 03/12/19 0420 03/13/19 0429  INR 2.7* 3.9* 1.9* 1.5* 1.4*   Cardiac Enzymes: No results for input(s): CKTOTAL, CKMB, CKMBINDEX, TROPONINI in the last 168 hours. BNP (last 3 results) No results for input(s): PROBNP in the last 8760 hours. HbA1C: No results for input(s): HGBA1C in the last 72 hours. CBG: Recent Labs  Lab 03/12/19 0733 03/12/19 1131 03/12/19 1610 03/12/19 2118 03/13/19 0703  GLUCAP 95 88 79 83 70   Lipid Profile: No results for input(s): CHOL, HDL, LDLCALC, TRIG, CHOLHDL, LDLDIRECT in the last 72 hours. Thyroid Function Tests: No results for input(s): TSH, T4TOTAL, FREET4, T3FREE, THYROIDAB in the last 72 hours. Anemia Panel: No results for input(s): VITAMINB12, FOLATE, FERRITIN, TIBC, IRON, RETICCTPCT in the last 72 hours. Sepsis Labs: Recent Labs  Lab 03/08/19 1149 03/08/19 1224 03/08/19 1322 03/10/19 0533  PROCALCITON  --  <0.10  --  0.25  LATICACIDVEN 1.2  --  1.1  --     Recent Results (from the past 240 hour(s))  Culture, blood (Routine x 2)     Status: None (Preliminary result)   Collection Time: 03/08/19 12:23 PM   Specimen: BLOOD RIGHT WRIST  Result Value Ref Range Status   Specimen Description BLOOD RIGHT WRIST  Final   Special Requests   Final    BOTTLES DRAWN AEROBIC AND ANAEROBIC Blood Culture adequate volume   Culture   Final    NO GROWTH 4 DAYS Performed at [facility], [address] [place], [place] [address]    Report Status PENDING  Incomplete  Culture, blood (Routine x 2)     Status: None (Preliminary result)   Collection Time: 03/08/19 12:24 PM   Specimen: BLOOD RIGHT FOREARM  Result Value Ref Range Status   Specimen Description BLOOD RIGHT FOREARM  Final   Special Requests   Final    BOTTLES DRAWN AEROBIC AND ANAEROBIC Blood Culture adequate volume   Culture   Final  NO GROWTH 4 DAYS Performed at [facility], [address]  [place], [place] [address]    Report Status PENDING  Incomplete  MRSA PCR Screening     Status: None   Collection Time: 03/08/19  2:35 PM   Specimen: Nasal Mucosa; Nasopharyngeal  Result Value Ref Range Status   MRSA by PCR NEGATIVE NEGATIVE Final    Comment:        The GeneXpert MRSA Assay (FDA approved for NASAL specimens only), is one component of a comprehensive MRSA colonization surveillance program. It is not intended to diagnose MRSA infection nor to guide or monitor treatment for MRSA infections. Performed at [facility], [address], [place], [place] [address]   Respiratory Panel by RT PCR (Flu A&B, Covid) - Nasopharyngeal Swab     Status: None   Collection Time: 03/08/19  2:55 PM   Specimen: Nasopharyngeal Swab  Result Value Ref Range Status   SARS Coronavirus 2 by RT PCR NEGATIVE NEGATIVE Final    Comment: (NOTE) SARS-CoV-2 target nucleic acids are NOT DETECTED. The SARS-CoV-2 RNA is generally detectable in upper respiratoy specimens during the acute phase of infection. The lowest concentration of SARS-CoV-2 viral copies this assay can detect is 131 copies/mL. A negative result does not preclude SARS-Cov-2 infection and should not be used as the sole basis for treatment or other patient management decisions. A negative result may occur with  improper specimen collection/handling, submission of specimen other than nasopharyngeal swab, presence of viral mutation(s) within the areas targeted by this assay, and inadequate number of viral copies (<131 copies/mL). A negative result must be combined with clinical observations, patient history, and epidemiological information. The expected result is Negative. Fact Sheet for Patients:  PinkCheek.be Fact Sheet for Healthcare Providers:  GravelBags.it This test is not yet ap proved or cleared by the [place] FDA and  has been authorized for detection and/or  diagnosis of SARS-CoV-2 by FDA under an Emergency Use Authorization (EUA). This EUA will remain  in effect (meaning this test can be used) for the duration of the COVID-19 declaration under Section 564(b)(1) of the Act, 21 U.S.C. section 360bbb-3(b)(1), unless the authorization is terminated or revoked sooner.    Influenza A by PCR NEGATIVE NEGATIVE Final   Influenza B by PCR NEGATIVE NEGATIVE Final    Comment: (NOTE) The Xpert Xpress SARS-CoV-2/FLU/RSV assay is intended as an aid in  the diagnosis of influenza from Nasopharyngeal swab specimens and  should not be used as a sole basis for treatment. Nasal washings and  aspirates are unacceptable for Xpert Xpress SARS-CoV-2/FLU/RSV  testing. Fact Sheet for Patients: PinkCheek.be Fact Sheet for Healthcare Providers: GravelBags.it This test is not yet approved or cleared by the [place] FDA and  has been authorized for detection and/or diagnosis of SARS-CoV-2 by  FDA under an Emergency Use Authorization (EUA). This EUA will remain  in effect (meaning this test can be used) for the duration of the  Covid-19 declaration under Section 564(b)(1) of the Act, 21  U.S.C. section 360bbb-3(b)(1), unless the authorization is  terminated or revoked. Performed at [facility], [address], [place],  [address]   MRSA PCR Screening     Status: None   Collection Time: 03/09/19  3:04 AM   Specimen: Nasopharyngeal  Result Value Ref Range Status   MRSA by PCR NEGATIVE NEGATIVE Final    Comment:        The GeneXpert MRSA Assay (FDA approved for NASAL specimens only), is one  component of a comprehensive MRSA colonization surveillance program. It is not intended to diagnose MRSA infection nor to guide or monitor treatment for MRSA infections. Performed at [facility], [place] [address] [place], [place] [address]       Radiology Studies: No results found.     Scheduled Meds: . amantadine  100 mg Oral BID  . busPIRone  15 mg Oral BID  . carbidopa-levodopa  2 tablet Oral QID  . Chlorhexidine Gluconate Cloth  6 each Topical Daily  . clozapine  50 mg Oral QHS  . dextrose  1 ampule Intravenous Once  . feeding supplement (ENSURE ENLIVE)  237 mL Oral BID BM  . magnesium citrate  1 Bottle Oral Once  . rasagiline  1 mg Oral Daily  . simvastatin  20 mg Oral Daily   Continuous Infusions: . sodium chloride Stopped (03/08/19 2335)  . ceFEPime (MAXIPIME) IV 2 g (03/13/19 0923)     LOS: 5 days      Time spent: 25 minutes   Dessa Phi, DO [facility] 03/13/2019, 10:40 AM   Available via Epic secure chat 7am-7pm After these hours, please refer to coverage provider listed on [email or web address]

## 2019-03-14 LAB — CULTURE, BLOOD (ROUTINE X 2)
Culture: NO GROWTH
Culture: NO GROWTH
Special Requests: ADEQUATE
Special Requests: ADEQUATE

## 2019-03-14 LAB — GLUCOSE, CAPILLARY: Glucose-Capillary: 78 mg/dL (ref 70–99)

## 2019-03-14 NOTE — Progress Notes (Signed)
Pharmacy Antibiotic Note  [name] is a 69 y.o. male admitted on 03/08/2019 with pneumonia.  Pharmacy has been consulted for Cefepime dosing.  Plan: Cefepime 2000 mg IV every 12 hours until 1/19. Monitor labs, c/s  Weight: 159 lb 13.3 oz (72.5 kg)  Temp (24hrs), Avg:98.9 F (37.2 C), Min:98.2 F (36.8 C), Max:99.5 F (37.5 C)  Recent Labs  Lab 03/08/19 1149 03/08/19 1224 03/08/19 1322 03/09/19 0038 03/09/19 0038 03/09/19 0514 03/09/19 1106 03/09/19 1801 03/09/19 2355 03/11/19 0339 03/12/19 0420 03/13/19 0429  WBC  --    < >  --  8.0   < > 10.1   < > 12.2* 13.5* 9.7 9.2 10.6*  CREATININE  --    < >  --  1.62*  --  1.53*  --   --  1.49* 1.42* 1.38*  --   LATICACIDVEN 1.2  --  1.1  --   --   --   --   --   --   --   --   --    < > = values in this interval not displayed.    Estimated Creatinine Clearance: 51.8 mL/min (A) (by C-G formula based on SCr of 1.38 mg/dL (H)).    Allergies  Allergen Reactions  . Penicillins     .Did it involve swelling of the face/tongue/throat, SOB, or low BP? Unknown Did it involve sudden or severe rash/hives, skin peeling, or any reaction on the inside of your mouth or nose? Unknown Did you need to seek medical attention at a hospital or doctor's office? Unknown When did it last happen?Unknown If all above answers are "NO", may proceed with cephalosporin use.     Antimicrobials this admission: Cefepime 1/12 >> (1/19) Vanco 1/12 >>12/14   Microbiology results: 1/12 BCx: NGTD 1/12 MRSA PCR: negative  Chrsitopher Wik A. Levada Dy, PharmD, BCPS, FNKF Clinical Pharmacist [place] Please utilize Amion for appropriate phone number to reach the unit pharmacist ([place])   03/14/2019 8:12 AM

## 2019-03-14 NOTE — Progress Notes (Signed)
Physical Therapy Treatment Patient Details Name: [name] MRN: [id number] DOB: Jul 26, 1950 Today's Date: 03/14/2019    History of Present Illness 69 year old male with past medical history significant for schizophrenia, Parkinson's disease, intellectual disability, dementia, history of DVT on warfarin who presented from skilled nursing facility due to altered mental status. Pt found to have sub-acute vs chronic L SDH, along with septic shock 2/2 R PNA.    PT Comments    Pt was seen for ROM to legs but was not able to actively assist.  Noted his legs wereable to move but not on command specifically.  Pt had lost condom cath so worked on bed mob to dress and change the bed.  He is somewhat able to help with there mobility but per nsg was up to attempt gait on the hall alone today.  Continue to encourage activity and mobiltiy to restore strength and control of balance for dc to his LTC SNF.   Follow Up Recommendations  SNF     Equipment Recommendations  None recommended by PT    Recommendations for Other Services       Precautions / Restrictions Precautions Precautions: Fall Precaution Comments: has lost condom cath when PT arrived Restrictions Weight Bearing Restrictions: No    Mobility  Bed Mobility Overal bed mobility: Needs Assistance Bed Mobility: Rolling(scooting up) Rolling: Mod assist;+2 for physical assistance;+2 for safety/equipment         General bed mobility comments: max of two with scooting up, does not involve legs despite attempting to get his help  Transfers                    Ambulation/Gait                 Stairs             Wheelchair Mobility    Modified Rankin (Stroke Patients Only)       Balance                                            Cognition Arousal/Alertness: Lethargic Behavior During Therapy: Flat affect Overall Cognitive Status: History of cognitive impairments - at  baseline                                 General Comments: limited following of bed mob commands      Exercises      General Comments        Pertinent Vitals/Pain Pain Assessment: Faces Faces Pain Scale: Hurts a little bit Pain Location: minor stiffness maybe with bed mob Pain Descriptors / Indicators: Tender Pain Intervention(s): Repositioned    Home Living                      Prior Function            PT Goals (current goals can now be found in the care plan section) Acute Rehab PT Goals Patient Stated Goal: unable to state PT Goal Formulation: Patient unable to participate in goal setting    Frequency    Min 2X/week      PT Plan Current plan remains appropriate    Co-evaluation              AM-PAC PT "6 Clicks" Mobility  Outcome Measure  Help needed turning from your back to your side while in a flat bed without using bedrails?: Total Help needed moving from lying on your back to sitting on the side of a flat bed without using bedrails?: Total Help needed moving to and from a bed to a chair (including a wheelchair)?: Total Help needed standing up from a chair using your arms (e.g., wheelchair or bedside chair)?: Total Help needed to walk in hospital room?: Total Help needed climbing 3-5 steps with a railing? : Total 6 Click Score: 6    End of Session   Activity Tolerance: Patient limited by fatigue;Patient limited by lethargy Patient left: in bed;with call bell/phone within reach;with bed alarm set;with nursing/sitter in room Nurse Communication: Mobility status PT Visit Diagnosis: Other abnormalities of gait and mobility (R26.89);Other symptoms and signs involving the nervous system (R29.898);Difficulty in walking, not elsewhere classified (R26.2)     Time: 7225-7505 PT Time Calculation (min) (ACUTE ONLY): 15 min  Charges:  $Therapeutic Activity: 8-22 mins                   Ramond Dial 03/14/2019, 4:06  PM   Mee Hives, PT MS Acute Rehab Dept. Number: [place]

## 2019-03-14 NOTE — Progress Notes (Signed)
PROGRESS NOTE    [name]  ZOX:[id number] DOB: 07/11/1950 DOA: 03/08/2019 PCP: Rosita Fire, MD     Brief Narrative:  [name] is a 69 year old male with past medical history significant for schizophrenia, Parkinson's disease, intellectual disability, dementia, history of DVT on warfarin who presented from skilled nursing facility due to altered mental status.  At baseline, he is alert and oriented to self, mostly nonverbal, ambulates without assistance, able to feed himself.  However at the nursing facility they noted that he was unable to sit or stand, had incoherent speech and reportedly weaker on the right side.  Work-up revealed CT head positive for subdural hematoma with right midline shift.  Neurosurgery was consulted and felt that this was a subacute or chronic change.  Recommended to hold anticoagulation.  Due to sepsis and septic shock, patient was started on Levophed and broad-spectrum antibiotics, transferred to [facility] for further evaluation and management under PCCM service.  He was weaned off pressors and transferred to [facility] 1/14.  New events last 24 hours / Subjective: Able to answer yes/no questions. Denies pain. Agrees that he will eat breakfast this morning.   Assessment & Plan:   Active Problems:   Hypotension   Encephalopathy   Pressure injury of skin   Subdural hematoma (HCC)   Palliative care by specialist   Goals of care, counseling/discussion   Delirium   Muscular weakness   Failure to thrive in adult   Full code status   Xerostomia   Spiritual or religious counseling   Septic shock secondary to suspected right pneumonia, sepsis present on admission -Influenza, Covid negative -Strep pneumo antigen negative -Weaned off Levophed and blood pressure remained stable -MRSA swab negative, stop vancomycin -Blood cultures negative to date -CXR: Subtle asymmetric opacity in the right upper lobe is new from previous exam. Cannot  rule out early pneumonia. Followup PA and lateral chest X-ray is recommended in 3-4 weeks following trial of antibiotic therapy to ensure resolution and exclude underlying malignancy  -Cefepime 7 days  Acute anemia, without evidence of blood loss -Hemoglobin on presentation 5.5 -FOBT negative -Iron studies does not indicate iron deficiency anemia, vitamin B12 within normal -Patient given 2 unit pRBC and IV vitamin K -Hemoglobin improved  Acute metabolic encephalopathy -Improved.  At baseline, patient is largely nonverbal, only mumbles incoherently, but is able to feed himself and walk.  Resides at a group home.  CKD stage 3a -Baseline creatinine 1.4 -Stable   Left chronic subdural hematoma -CT head 1/12: Interval development of a thin intermediate density subdural hematoma overlying the left cerebral convexity measuring 9 mm in thickness. Mild mass effect upon the underlying left cerebral hemisphere. 2 mm rightward midline shift. -Repeat CT head 1/13: 9 mm extra-axial fluid collection left is larger compared with yesterday now measuring up to 9 mm. Findings compatible with chronic subdural hematoma. Slight increase in midline shift to the right now 3 mm. -Neurosurgery recommended nonsurgical management  -Stop warfarin  History of right lower extremity DVT in September 2020 -Thought to be provoked in setting of immobilization and treatment for cellulitis in August 2020 -Stop warfarin  History of schizophrenia, Parkinson's disease -Continue amantadine, BuSpar, Sinemet, Klonopin, Clozaril, rasagiline  Hyperlipidemia -Continue Zocor    In agreement with assessment of the pressure ulcer as below:  Pressure Injury 03/09/19 Anus Mid Stage 2 -  Partial thickness loss of dermis presenting as a shallow open injury with a red, pink wound bed without slough. (Active)  03/09/19  0323  Location: Anus  Location Orientation: Mid  Staging: Stage 2 -  Partial thickness loss of dermis presenting  as a shallow open injury with a red, pink wound bed without slough.  Wound Description (Comments):   Present on Admission: Yes        DVT prophylaxis: SCD Code Status: Full code Family Communication: None at bedside Disposition Plan: SNF placement pending   Consultants:   PCCM admission  Palliative care medicine   Antimicrobials:  Anti-infectives (From admission, onward)   Start     Dose/Rate Route Frequency Ordered Stop   03/09/19 1500  vancomycin (VANCOCIN) IVPB 1000 mg/200 mL premix  Status:  Discontinued     1,000 mg 200 mL/hr over 60 Minutes Intravenous Every 24 hours 03/08/19 1436 03/10/19 0714   03/09/19 0000  ceFEPIme (MAXIPIME) 2 g in sodium chloride 0.9 % 100 mL IVPB     2 g 200 mL/hr over 30 Minutes Intravenous Every 12 hours 03/08/19 1437 03/15/19 2159   03/08/19 1500  vancomycin (VANCOREADY) IVPB 500 mg/100 mL     500 mg 100 mL/hr over 60 Minutes Intravenous  Once 03/08/19 1435 03/08/19 1828   03/08/19 1230  vancomycin (VANCOCIN) IVPB 1000 mg/200 mL premix     1,000 mg 200 mL/hr over 60 Minutes Intravenous  Once 03/08/19 1229 03/08/19 1500   03/08/19 1230  ceFEPIme (MAXIPIME) 2 g in sodium chloride 0.9 % 100 mL IVPB     2 g 200 mL/hr over 30 Minutes Intravenous  Once 03/08/19 1229 03/08/19 1330   03/08/19 1230  azithromycin (ZITHROMAX) 500 mg in sodium chloride 0.9 % 250 mL IVPB     500 mg 250 mL/hr over 60 Minutes Intravenous  Once 03/08/19 1229 03/08/19 1402       Objective: Vitals:   03/13/19 0941 03/13/19 1636 03/13/19 2134 03/14/19 0531  BP: 124/62 126/74 119/71 128/77  Pulse: 66 77 72 70  Resp: 18 18 18 18   Temp: 98.2 F (36.8 C) 98.8 F (37.1 C) 98.9 F (37.2 C) 99.5 F (37.5 C)  TempSrc: Oral Oral  Oral  SpO2: 98% 95% 93% 97%  Weight:   72.5 kg     Intake/Output Summary (Last 24 hours) at 03/14/2019 0822 Last data filed at 03/14/2019 0538 Gross per 24 hour  Intake 837 ml  Output 1425 ml  Net -588 ml   Filed Weights   03/11/19  2021 03/13/19 0500 03/13/19 2134  Weight: 74.4 kg 72.5 kg 72.5 kg    Examination: General exam: Appears calm and comfortable  Respiratory system: Clear to auscultation. Respiratory effort normal. Cardiovascular system: S1 & S2 heard, RRR. No pedal edema. Gastrointestinal system: Abdomen is nondistended, soft and nontender. Normal bowel sounds heard. Central nervous system: Alert  Extremities: Symmetric in appearance bilaterally  Skin: No rashes, lesions or ulcers on exposed skin    Data Reviewed: I have personally reviewed following labs and imaging studies  CBC: Recent Labs  Lab 03/08/19 1224 03/09/19 0038 03/09/19 1801 03/09/19 2355 03/11/19 0339 03/12/19 0420 03/13/19 0429  WBC 4.6   < > 12.2* 13.5* 9.7 9.2 10.6*  NEUTROABS 3.5  --   --   --   --   --   --   HGB 5.5*   < > 9.6* 9.0* 8.0* 7.4* 7.9*  HCT 20.2*   < > 31.8* 30.9* 27.1* 25.2* 27.3*  MCV 93.5   < > 90.1 92.2 91.6 90.0 91.3  PLT 310   < > 326 313 291  251 282   < > = values in this interval not displayed.   Basic Metabolic Panel: Recent Labs  Lab 03/09/19 0038 03/09/19 0038 03/09/19 0439 03/09/19 0514 03/09/19 2355 03/11/19 0339 03/12/19 0420  NA 142   < > 145 144 145 148* 144  K 4.4   < > 4.2 4.3 4.5 4.1 3.7  CL 110  --   --  109 114* 112* 111  CO2 27  --   --  27 23 26 27   GLUCOSE 83  --   --  119* 89 74 78  BUN 23  --   --  21 15 11 13   CREATININE 1.62*  --   --  1.53* 1.49* 1.42* 1.38*  CALCIUM 8.0*  --   --  8.4* 8.4* 8.5* 8.0*  MG  --   --   --  2.8*  --   --   --   PHOS  --   --   --  3.1  --   --   --    < > = values in this interval not displayed.   GFR: Estimated Creatinine Clearance: 51.8 mL/min (A) (by C-G formula based on SCr of 1.38 mg/dL (H)). Liver Function Tests: Recent Labs  Lab 03/08/19 1224  AST 31  ALT 7  ALKPHOS 90  BILITOT 1.2  PROT 6.2*  ALBUMIN 2.8*   No results for input(s): LIPASE, AMYLASE in the last 168 hours. Recent Labs  Lab 03/09/19 0514  AMMONIA 38*    Coagulation Profile: Recent Labs  Lab 03/09/19 0514 03/10/19 0915 03/11/19 0339 03/12/19 0420 03/13/19 0429  INR 2.7* 3.9* 1.9* 1.5* 1.4*   Cardiac Enzymes: No results for input(s): CKTOTAL, CKMB, CKMBINDEX, TROPONINI in the last 168 hours. BNP (last 3 results) No results for input(s): PROBNP in the last 8760 hours. HbA1C: No results for input(s): HGBA1C in the last 72 hours. CBG: Recent Labs  Lab 03/12/19 2118 03/13/19 0703 03/13/19 1132 03/13/19 2133 03/14/19 0657  GLUCAP 83 70 155* 112* 78   Lipid Profile: No results for input(s): CHOL, HDL, LDLCALC, TRIG, CHOLHDL, LDLDIRECT in the last 72 hours. Thyroid Function Tests: No results for input(s): TSH, T4TOTAL, FREET4, T3FREE, THYROIDAB in the last 72 hours. Anemia Panel: No results for input(s): VITAMINB12, FOLATE, FERRITIN, TIBC, IRON, RETICCTPCT in the last 72 hours. Sepsis Labs: Recent Labs  Lab 03/08/19 1149 03/08/19 1224 03/08/19 1322 03/10/19 0533  PROCALCITON  --  <0.10  --  0.25  LATICACIDVEN 1.2  --  1.1  --     Recent Results (from the past 240 hour(s))  Culture, blood (Routine x 2)     Status: None   Collection Time: 03/08/19 12:23 PM   Specimen: BLOOD RIGHT WRIST  Result Value Ref Range Status   Specimen Description BLOOD RIGHT WRIST  Final   Special Requests   Final    BOTTLES DRAWN AEROBIC AND ANAEROBIC Blood Culture adequate volume   Culture   Final    NO GROWTH 6 DAYS Performed at [facility], [address], [place], [place] [address]    Report Status 03/14/2019 FINAL  Final  Culture, blood (Routine x 2)     Status: None   Collection Time: 03/08/19 12:24 PM   Specimen: BLOOD RIGHT FOREARM  Result Value Ref Range Status   Specimen Description BLOOD RIGHT FOREARM  Final   Special Requests   Final    BOTTLES DRAWN AEROBIC AND ANAEROBIC Blood Culture adequate volume   Culture  Final    NO GROWTH 6 DAYS Performed at [facility], [address], [place], [place] [address]    Report  Status 03/14/2019 FINAL  Final  MRSA PCR Screening     Status: None   Collection Time: 03/08/19  2:35 PM   Specimen: Nasal Mucosa; Nasopharyngeal  Result Value Ref Range Status   MRSA by PCR NEGATIVE NEGATIVE Final    Comment:        The GeneXpert MRSA Assay (FDA approved for NASAL specimens only), is one component of a comprehensive MRSA colonization surveillance program. It is not intended to diagnose MRSA infection nor to guide or monitor treatment for MRSA infections. Performed at [facility], [address], [place], [place] [address]   Respiratory Panel by RT PCR (Flu A&B, Covid) - Nasopharyngeal Swab     Status: None   Collection Time: 03/08/19  2:55 PM   Specimen: Nasopharyngeal Swab  Result Value Ref Range Status   SARS Coronavirus 2 by RT PCR NEGATIVE NEGATIVE Final    Comment: (NOTE) SARS-CoV-2 target nucleic acids are NOT DETECTED. The SARS-CoV-2 RNA is generally detectable in upper respiratoy specimens during the acute phase of infection. The lowest concentration of SARS-CoV-2 viral copies this assay can detect is 131 copies/mL. A negative result does not preclude SARS-Cov-2 infection and should not be used as the sole basis for treatment or other patient management decisions. A negative result may occur with  improper specimen collection/handling, submission of specimen other than nasopharyngeal swab, presence of viral mutation(s) within the areas targeted by this assay, and inadequate number of viral copies (<131 copies/mL). A negative result must be combined with clinical observations, patient history, and epidemiological information. The expected result is Negative. Fact Sheet for Patients:  PinkCheek.be Fact Sheet for Healthcare Providers:  GravelBags.it This test is not yet ap proved or cleared by the [place] FDA and  has been authorized for detection and/or diagnosis of SARS-CoV-2 by FDA  under an Emergency Use Authorization (EUA). This EUA will remain  in effect (meaning this test can be used) for the duration of the COVID-19 declaration under Section 564(b)(1) of the Act, 21 U.S.C. section 360bbb-3(b)(1), unless the authorization is terminated or revoked sooner.    Influenza A by PCR NEGATIVE NEGATIVE Final   Influenza B by PCR NEGATIVE NEGATIVE Final    Comment: (NOTE) The Xpert Xpress SARS-CoV-2/FLU/RSV assay is intended as an aid in  the diagnosis of influenza from Nasopharyngeal swab specimens and  should not be used as a sole basis for treatment. Nasal washings and  aspirates are unacceptable for Xpert Xpress SARS-CoV-2/FLU/RSV  testing. Fact Sheet for Patients: PinkCheek.be Fact Sheet for Healthcare Providers: GravelBags.it This test is not yet approved or cleared by the [place] FDA and  has been authorized for detection and/or diagnosis of SARS-CoV-2 by  FDA under an Emergency Use Authorization (EUA). This EUA will remain  in effect (meaning this test can be used) for the duration of the  Covid-19 declaration under Section 564(b)(1) of the Act, 21  U.S.C. section 360bbb-3(b)(1), unless the authorization is  terminated or revoked. Performed at [facility], [address], [place], [place] [address]   MRSA PCR Screening     Status: None   Collection Time: 03/09/19  3:04 AM   Specimen: Nasopharyngeal  Result Value Ref Range Status   MRSA by PCR NEGATIVE NEGATIVE Final    Comment:        The GeneXpert MRSA Assay (FDA approved for  NASAL specimens only), is one component of a comprehensive MRSA colonization surveillance program. It is not intended to diagnose MRSA infection nor to guide or monitor treatment for MRSA infections. Performed at [facility], [place] [address] [place], [place] [address]       Radiology Studies: No results found.    Scheduled Meds: . amantadine  100 mg  Oral BID  . busPIRone  15 mg Oral BID  . carbidopa-levodopa  2 tablet Oral QID  . clozapine  50 mg Oral QHS  . dextrose  1 ampule Intravenous Once  . feeding supplement (ENSURE ENLIVE)  237 mL Oral BID BM  . magnesium citrate  1 Bottle Oral Once  . rasagiline  1 mg Oral Daily  . simvastatin  20 mg Oral Daily   Continuous Infusions: . sodium chloride Stopped (03/08/19 2335)  . ceFEPime (MAXIPIME) IV 2 g (03/13/19 2112)     LOS: 6 days      Time spent: 20 minutes   Dessa Phi, DO [facility] 03/14/2019, 8:22 AM   Available via Epic secure chat 7am-7pm After these hours, please refer to coverage provider listed on [email or web address]

## 2019-03-14 NOTE — Plan of Care (Signed)
  Problem: Activity: Goal: Risk for activity intolerance will decrease Outcome: Progressing   

## 2019-03-14 NOTE — Clinical Social Work Note (Addendum)
Facility search initiated 1/18 and no bed offers at this time. Call  made to [facility] (main number) and no answer. Call made to [facility] and the admission director's VM is full. CSW will follow up with these 2 facilities and the other [facility] on 1/19.  Angelize Ryce Givens, MSW, LCSW Licensed Clinical Social Worker [place] [phone]

## 2019-03-15 NOTE — Progress Notes (Signed)
OT Cancellation Note  Patient Details Name: [name] MRN: [id number] DOB: [birth year]   Cancelled Treatment:    Reason Eval/Treat Not Completed: Fatigue/lethargy limiting ability to participate;Other (comment) Pt lethargic upon OT arrival,unable to follow commands or participate productively in session. Will follow up as time allows.   Lanier Clam., COTA/L Acute Rehabilitation Services [phone] [place] 03/15/2019, 11:20 AM

## 2019-03-15 NOTE — TOC Progression Note (Signed)
Transition of Care [facility]) - Progression Note    Patient Details  Name: MARON STANZIONE MRN: [id number] Date of Birth: Jul 14, 1950  [facility]) CM/SW Contact  Sharlet Salina Mila Homer, LCSW Phone Number: 03/15/2019, 6:16 PM  Clinical Narrative: Continuing to seek SNF placement for patient. Calls made to the following facilities: *[facility] - VM left for admissions director *[facility] - Left message for admissions director. Received a return call and they are unable to accept patient. [facility] - Currently only taking COVID positive patients *[place] - Left message for [facility] - admissions director [facility] - Not currently accepting new admissions  Call made to Ms. [facility] regarding expanding bed search for patient and she requested family be contacted. Call made to son and update provided re: facility search and he expressed agreement with expanding the search outside of [facility].  Clinicals sent to [place].   Expected Discharge Plan: [place] Barriers to Discharge: Continued Medical Work up  Expected Discharge Plan and Services Expected Discharge Plan: [place] In-house Referral: Clinical Social Work     Living arrangements for the past 2 months: Assisted Living Facility([facility] in [place])                                       Social Determinants of Health (SDOH) Interventions  No SDOH interventions needed or requested at this time.  Readmission Risk Interventions Readmission Risk Prevention Plan 02/11/2019  Transportation Screening Complete  Medication Review (RN Care Manager) Complete  Palliative Care Screening Not Applicable  Some recent data might be hidden

## 2019-03-15 NOTE — Progress Notes (Signed)
SLP Cancellation Note  Patient Details Name: ROBSON TRICKEY MRN: [id number] DOB: [birth year]   Cancelled treatment:       Reason Eval/Treat Not Completed: Fatigue/lethargy limiting ability to participate(pt currently lethargic, did not awaken enough to consume po, NT reports no coughing with breakfast and that pt tolerated well)  Will continue efforts.   Macario Golds 03/15/2019, 8:44 AM  Kathleen Lime, MS [place] Office [phone]

## 2019-03-15 NOTE — Progress Notes (Signed)
PROGRESS NOTE    [name]  AST:[id number] DOB: 18-Nov-1950 DOA: 03/08/2019 PCP: Rosita Fire, MD     Brief Narrative:  [name] is a 69 year old male with past medical history significant for schizophrenia, Parkinson's disease, intellectual disability, dementia, history of DVT on warfarin who presented from skilled nursing facility due to altered mental status.  At baseline, he is alert and oriented to self, mostly nonverbal, ambulates without assistance, able to feed himself.  However at the nursing facility they noted that he was unable to sit or stand, had incoherent speech and reportedly weaker on the right side.  Work-up revealed CT head positive for subdural hematoma with right midline shift.  Neurosurgery was consulted and felt that this was a subacute or chronic change.  Recommended to hold anticoagulation.  Due to sepsis and septic shock, patient was started on Levophed and broad-spectrum antibiotics, transferred to [facility] for further evaluation and management under PCCM service.  He was weaned off pressors and transferred to [facility] 1/14.  New events last 24 hours / Subjective: Mentation seems to be baseline, nonverbal mostly. Eating breakfast with RN and aide at bedside. No acute issues.   Assessment & Plan:   Active Problems:   Hypotension   Encephalopathy   Pressure injury of skin   Subdural hematoma (HCC)   Palliative care by specialist   Goals of care, counseling/discussion   Delirium   Muscular weakness   Failure to thrive in adult   Full code status   Xerostomia   Spiritual or religious counseling   Septic shock secondary to suspected right pneumonia, sepsis present on admission -Influenza, Covid negative -Strep pneumo antigen negative -Weaned off Levophed and blood pressure remained stable -MRSA swab negative, stop vancomycin -Blood cultures negative to date -CXR: Subtle asymmetric opacity in the right upper lobe is new from  previous exam. Cannot rule out early pneumonia. Followup PA and lateral chest X-ray is recommended in 3-4 weeks following trial of antibiotic therapy to ensure resolution and exclude underlying malignancy  -Cefepime 7 days  Acute anemia, without evidence of blood loss -Hemoglobin on presentation 5.5 -FOBT negative -Iron studies does not indicate iron deficiency anemia, vitamin B12 within normal -Patient given 2 unit pRBC and IV vitamin K -Hemoglobin improved  Acute metabolic encephalopathy -Improved.  At baseline, patient is largely nonverbal, only mumbles incoherently, but is able to feed himself and walk.  Resides at a group home.  CKD stage 3a -Baseline creatinine 1.4 -Stable   Left chronic subdural hematoma -CT head 1/12: Interval development of a thin intermediate density subdural hematoma overlying the left cerebral convexity measuring 9 mm in thickness. Mild mass effect upon the underlying left cerebral hemisphere. 2 mm rightward midline shift. -Repeat CT head 1/13: 9 mm extra-axial fluid collection left is larger compared with yesterday now measuring up to 9 mm. Findings compatible with chronic subdural hematoma. Slight increase in midline shift to the right now 3 mm. -Neurosurgery recommended nonsurgical management  -Stop warfarin  History of right lower extremity DVT in September 2020 -Thought to be provoked in setting of immobilization and treatment for cellulitis in August 2020 -Stop warfarin  History of schizophrenia, Parkinson's disease -Continue amantadine, BuSpar, Sinemet, Klonopin, Clozaril, rasagiline  Hyperlipidemia -Continue Zocor    In agreement with assessment of the pressure ulcer as below:  Pressure Injury 03/09/19 Anus Mid Stage 2 -  Partial thickness loss of dermis presenting as a shallow open injury with a red, pink wound bed without slough. (  Active)  03/09/19 0323  Location: Anus  Location Orientation: Mid  Staging: Stage 2 -  Partial thickness loss  of dermis presenting as a shallow open injury with a red, pink wound bed without slough.  Wound Description (Comments):   Present on Admission: Yes        DVT prophylaxis: SCD Code Status: Full code Family Communication: None at bedside Disposition Plan: SNF placement pending   Consultants:   PCCM admission  Palliative care medicine   Antimicrobials:  Anti-infectives (From admission, onward)   Start     Dose/Rate Route Frequency Ordered Stop   03/09/19 1500  vancomycin (VANCOCIN) IVPB 1000 mg/200 mL premix  Status:  Discontinued     1,000 mg 200 mL/hr over 60 Minutes Intravenous Every 24 hours 03/08/19 1436 03/10/19 0714   03/09/19 0000  ceFEPIme (MAXIPIME) 2 g in sodium chloride 0.9 % 100 mL IVPB     2 g 200 mL/hr over 30 Minutes Intravenous Every 12 hours 03/08/19 1437 03/15/19 0958   03/08/19 1500  vancomycin (VANCOREADY) IVPB 500 mg/100 mL     500 mg 100 mL/hr over 60 Minutes Intravenous  Once 03/08/19 1435 03/08/19 1828   03/08/19 1230  vancomycin (VANCOCIN) IVPB 1000 mg/200 mL premix     1,000 mg 200 mL/hr over 60 Minutes Intravenous  Once 03/08/19 1229 03/08/19 1500   03/08/19 1230  ceFEPIme (MAXIPIME) 2 g in sodium chloride 0.9 % 100 mL IVPB     2 g 200 mL/hr over 30 Minutes Intravenous  Once 03/08/19 1229 03/08/19 1330   03/08/19 1230  azithromycin (ZITHROMAX) 500 mg in sodium chloride 0.9 % 250 mL IVPB     500 mg 250 mL/hr over 60 Minutes Intravenous  Once 03/08/19 1229 03/08/19 1402       Objective: Vitals:   03/14/19 1743 03/14/19 2106 03/15/19 0454 03/15/19 0919  BP: 122/72 (!) 102/57 117/65 102/68  Pulse: 79 (!) 49 67 62  Resp: 18 17  16   Temp: 98.4 F (36.9 C) (!) 97.5 F (36.4 C) (!) 97.5 F (36.4 C)   TempSrc: Oral Oral Oral   SpO2: 100% 96% 100% 90%  Weight:        Intake/Output Summary (Last 24 hours) at 03/15/2019 1049 Last data filed at 03/15/2019 0900 Gross per 24 hour  Intake 1045.88 ml  Output 2377 ml  Net -1331.12 ml   Filed  Weights   03/11/19 2021 03/13/19 0500 03/13/19 2134  Weight: 74.4 kg 72.5 kg 72.5 kg     Examination: General exam: Appears calm and comfortable  Respiratory system: Clear to auscultation. Respiratory effort normal. Cardiovascular system: S1 & S2 heard, RRR. No pedal edema. Gastrointestinal system: Abdomen is nondistended, soft and nontender. Normal bowel sounds heard. Central nervous system: Alert Extremities: Symmetric in appearance bilaterally  Skin: No rashes, lesions or ulcers on exposed skin     Data Reviewed: I have personally reviewed following labs and imaging studies  CBC: Recent Labs  Lab 03/08/19 1224 03/09/19 0038 03/09/19 1801 03/09/19 2355 03/11/19 0339 03/12/19 0420 03/13/19 0429  WBC 4.6   < > 12.2* 13.5* 9.7 9.2 10.6*  NEUTROABS 3.5  --   --   --   --   --   --   HGB 5.5*   < > 9.6* 9.0* 8.0* 7.4* 7.9*  HCT 20.2*   < > 31.8* 30.9* 27.1* 25.2* 27.3*  MCV 93.5   < > 90.1 92.2 91.6 90.0 91.3  PLT 310   < >  326 313 291 251 282   < > = values in this interval not displayed.   Basic Metabolic Panel: Recent Labs  Lab 03/09/19 0038 03/09/19 0038 03/09/19 0439 03/09/19 0514 03/09/19 2355 03/11/19 0339 03/12/19 0420  NA 142   < > 145 144 145 148* 144  K 4.4   < > 4.2 4.3 4.5 4.1 3.7  CL 110  --   --  109 114* 112* 111  CO2 27  --   --  27 23 26 27   GLUCOSE 83  --   --  119* 89 74 78  BUN 23  --   --  21 15 11 13   CREATININE 1.62*  --   --  1.53* 1.49* 1.42* 1.38*  CALCIUM 8.0*  --   --  8.4* 8.4* 8.5* 8.0*  MG  --   --   --  2.8*  --   --   --   PHOS  --   --   --  3.1  --   --   --    < > = values in this interval not displayed.   GFR: Estimated Creatinine Clearance: 51.8 mL/min (A) (by C-G formula based on SCr of 1.38 mg/dL (H)). Liver Function Tests: Recent Labs  Lab 03/08/19 1224  AST 31  ALT 7  ALKPHOS 90  BILITOT 1.2  PROT 6.2*  ALBUMIN 2.8*   No results for input(s): LIPASE, AMYLASE in the last 168 hours. Recent Labs  Lab  03/09/19 0514  AMMONIA 38*   Coagulation Profile: Recent Labs  Lab 03/09/19 0514 03/10/19 0915 03/11/19 0339 03/12/19 0420 03/13/19 0429  INR 2.7* 3.9* 1.9* 1.5* 1.4*   Cardiac Enzymes: No results for input(s): CKTOTAL, CKMB, CKMBINDEX, TROPONINI in the last 168 hours. BNP (last 3 results) No results for input(s): PROBNP in the last 8760 hours. HbA1C: No results for input(s): HGBA1C in the last 72 hours. CBG: Recent Labs  Lab 03/12/19 2118 03/13/19 0703 03/13/19 1132 03/13/19 2133 03/14/19 0657  GLUCAP 83 70 155* 112* 78   Lipid Profile: No results for input(s): CHOL, HDL, LDLCALC, TRIG, CHOLHDL, LDLDIRECT in the last 72 hours. Thyroid Function Tests: No results for input(s): TSH, T4TOTAL, FREET4, T3FREE, THYROIDAB in the last 72 hours. Anemia Panel: No results for input(s): VITAMINB12, FOLATE, FERRITIN, TIBC, IRON, RETICCTPCT in the last 72 hours. Sepsis Labs: Recent Labs  Lab 03/08/19 1149 03/08/19 1224 03/08/19 1322 03/10/19 0533  PROCALCITON  --  <0.10  --  0.25  LATICACIDVEN 1.2  --  1.1  --     Recent Results (from the past 240 hour(s))  Culture, blood (Routine x 2)     Status: None   Collection Time: 03/08/19 12:23 PM   Specimen: BLOOD RIGHT WRIST  Result Value Ref Range Status   Specimen Description BLOOD RIGHT WRIST  Final   Special Requests   Final    BOTTLES DRAWN AEROBIC AND ANAEROBIC Blood Culture adequate volume   Culture   Final    NO GROWTH 6 DAYS Performed at [facility], [address], [place], [place] [address]    Report Status 03/14/2019 FINAL  Final  Culture, blood (Routine x 2)     Status: None   Collection Time: 03/08/19 12:24 PM   Specimen: BLOOD RIGHT FOREARM  Result Value Ref Range Status   Specimen Description BLOOD RIGHT FOREARM  Final   Special Requests   Final    BOTTLES DRAWN AEROBIC AND ANAEROBIC Blood Culture adequate volume  Culture   Final    NO GROWTH 6 DAYS Performed at [facility], [address]  [place], [place] [address]    Report Status 03/14/2019 FINAL  Final  MRSA PCR Screening     Status: None   Collection Time: 03/08/19  2:35 PM   Specimen: Nasal Mucosa; Nasopharyngeal  Result Value Ref Range Status   MRSA by PCR NEGATIVE NEGATIVE Final    Comment:        The GeneXpert MRSA Assay (FDA approved for NASAL specimens only), is one component of a comprehensive MRSA colonization surveillance program. It is not intended to diagnose MRSA infection nor to guide or monitor treatment for MRSA infections. Performed at [facility], [address], [place], [place] [address]   Respiratory Panel by RT PCR (Flu A&B, Covid) - Nasopharyngeal Swab     Status: None   Collection Time: 03/08/19  2:55 PM   Specimen: Nasopharyngeal Swab  Result Value Ref Range Status   SARS Coronavirus 2 by RT PCR NEGATIVE NEGATIVE Final    Comment: (NOTE) SARS-CoV-2 target nucleic acids are NOT DETECTED. The SARS-CoV-2 RNA is generally detectable in upper respiratoy specimens during the acute phase of infection. The lowest concentration of SARS-CoV-2 viral copies this assay can detect is 131 copies/mL. A negative result does not preclude SARS-Cov-2 infection and should not be used as the sole basis for treatment or other patient management decisions. A negative result may occur with  improper specimen collection/handling, submission of specimen other than nasopharyngeal swab, presence of viral mutation(s) within the areas targeted by this assay, and inadequate number of viral copies (<131 copies/mL). A negative result must be combined with clinical observations, patient history, and epidemiological information. The expected result is Negative. Fact Sheet for Patients:  PinkCheek.be Fact Sheet for Healthcare Providers:  GravelBags.it This test is not yet ap proved or cleared by the [place] FDA and  has been authorized for detection and/or  diagnosis of SARS-CoV-2 by FDA under an Emergency Use Authorization (EUA). This EUA will remain  in effect (meaning this test can be used) for the duration of the COVID-19 declaration under Section 564(b)(1) of the Act, 21 U.S.C. section 360bbb-3(b)(1), unless the authorization is terminated or revoked sooner.    Influenza A by PCR NEGATIVE NEGATIVE Final   Influenza B by PCR NEGATIVE NEGATIVE Final    Comment: (NOTE) The Xpert Xpress SARS-CoV-2/FLU/RSV assay is intended as an aid in  the diagnosis of influenza from Nasopharyngeal swab specimens and  should not be used as a sole basis for treatment. Nasal washings and  aspirates are unacceptable for Xpert Xpress SARS-CoV-2/FLU/RSV  testing. Fact Sheet for Patients: PinkCheek.be Fact Sheet for Healthcare Providers: GravelBags.it This test is not yet approved or cleared by the [place] FDA and  has been authorized for detection and/or diagnosis of SARS-CoV-2 by  FDA under an Emergency Use Authorization (EUA). This EUA will remain  in effect (meaning this test can be used) for the duration of the  Covid-19 declaration under Section 564(b)(1) of the Act, 21  U.S.C. section 360bbb-3(b)(1), unless the authorization is  terminated or revoked. Performed at [facility], [address], [place], [place] [address]   MRSA PCR Screening     Status: None   Collection Time: 03/09/19  3:04 AM   Specimen: Nasopharyngeal  Result Value Ref Range Status   MRSA by PCR NEGATIVE NEGATIVE Final    Comment:        The GeneXpert MRSA Assay (  FDA approved for NASAL specimens only), is one component of a comprehensive MRSA colonization surveillance program. It is not intended to diagnose MRSA infection nor to guide or monitor treatment for MRSA infections. Performed at [facility], [place] [address] [place],  [address]       Radiology Studies: No results found.     Scheduled Meds: . amantadine  100 mg Oral BID  . busPIRone  15 mg Oral BID  . carbidopa-levodopa  2 tablet Oral QID  . clozapine  50 mg Oral QHS  . dextrose  1 ampule Intravenous Once  . feeding supplement (ENSURE ENLIVE)  237 mL Oral BID BM  . magnesium citrate  1 Bottle Oral Once  . rasagiline  1 mg Oral Daily  . simvastatin  20 mg Oral Daily   Continuous Infusions: . sodium chloride Stopped (03/14/19 2315)     LOS: 7 days      Time spent: 15 minutes   Dessa Phi, DO [facility] 03/15/2019, 10:49 AM   Available via Epic secure chat 7am-7pm After these hours, please refer to coverage provider listed on [email or web address]

## 2019-03-16 DIAGNOSIS — L8992 Pressure ulcer of unspecified site, stage 2: Secondary | ICD-10-CM

## 2019-03-16 DIAGNOSIS — D649 Anemia, unspecified: Secondary | ICD-10-CM

## 2019-03-16 DIAGNOSIS — S065X9A Traumatic subdural hemorrhage with loss of consciousness of unspecified duration, initial encounter: Secondary | ICD-10-CM

## 2019-03-16 DIAGNOSIS — J189 Pneumonia, unspecified organism: Secondary | ICD-10-CM

## 2019-03-16 LAB — CBC
HCT: 28.5 % — ABNORMAL LOW (ref 39.0–52.0)
Hemoglobin: 8.2 g/dL — ABNORMAL LOW (ref 13.0–17.0)
MCH: 26.2 pg (ref 26.0–34.0)
MCHC: 28.8 g/dL — ABNORMAL LOW (ref 30.0–36.0)
MCV: 91.1 fL (ref 80.0–100.0)
Platelets: 285 10*3/uL (ref 150–400)
RBC: 3.13 MIL/uL — ABNORMAL LOW (ref 4.22–5.81)
RDW: 16.4 % — ABNORMAL HIGH (ref 11.5–15.5)
WBC: 11.9 10*3/uL — ABNORMAL HIGH (ref 4.0–10.5)
nRBC: 0 % (ref 0.0–0.2)

## 2019-03-16 LAB — BASIC METABOLIC PANEL
Anion gap: 5 (ref 5–15)
BUN: 16 mg/dL (ref 8–23)
CO2: 26 mmol/L (ref 22–32)
Calcium: 8.3 mg/dL — ABNORMAL LOW (ref 8.9–10.3)
Chloride: 112 mmol/L — ABNORMAL HIGH (ref 98–111)
Creatinine, Ser: 1.07 mg/dL (ref 0.61–1.24)
GFR calc Af Amer: >60 mL/min (ref >60)
GFR calc non Af Amer: >60 mL/min (ref >60)
Glucose, Bld: 83 mg/dL (ref 70–99)
Potassium: 3.8 mmol/L (ref 3.5–5.1)
Sodium: 143 mmol/L (ref 135–145)

## 2019-03-16 MED ORDER — FOLIC ACID 1 MG PO TABS
1.0000 mg | ORAL_TABLET | Freq: Every day | ORAL | Status: DC
  Administered 2019-03-16 – 2019-03-18 (×3): 1 mg via ORAL
  Filled 2019-03-16 (×3): qty 1

## 2019-03-16 NOTE — Progress Notes (Signed)
PT Cancellation Note  Patient Details Name: [name] MRN: [id number] DOB: 1950/11/05   Cancelled Treatment:    Reason Eval/Treat Not Completed: Fatigue/lethargy limiting ability to participate;Other (comment).  Pt was eating his meal then declined therapy when PT returned.  Will try again at another time.  Pt is usually willing to do some work.   Ramond Dial 03/16/2019, 1:32 PM   Mee Hives, PT MS Acute Rehab Dept. Number: [place] and [place]

## 2019-03-16 NOTE — Clinical Social Work Note (Signed)
Facility search expanded to [place] as no bed offers at this time. CSW will continue working on SNF placement for patient.  Trudee Chirino Givens, MSW, LCSW Licensed Clinical Social Worker [place] [phone]

## 2019-03-16 NOTE — Progress Notes (Signed)
  Speech Language Pathology Treatment: Dysphagia  Patient Details Name: [name] MRN: [id number] DOB: May 03, 1950 Today's Date: 03/16/2019 Time: 9937-1696 SLP Time Calculation (min) (ACUTE ONLY): 22 min  Assessment / Plan / Recommendation Clinical Impression  Pt seen at bedside for skilled ST targeting dysphagia. Pt alert, sitting in bed watching TV, greeted ST upon entry. Patient seen with PO trials of thin liquids via straw and cup sips and trials of dysphagia 1 solids. No overt s/s aspiration seen with thin liquids or pureed solids.  With puree trials, oral phase notable for prolonged mastication/bolus formation prior to initiating the swallow. ?discoordination of the swallow in one instance. Patient with no coughing or throat clearing and able to achieve good oral clearance. Pt educated re: aspiration precautions and compensatory strategies. Recommend continuing dysphagia 1/puree solids and thin liquids. Patient should only eat/drink when awake/alert, sitting upright and with supervision to cue him for strategies: small bites/sips, slow rate of intake, upright posture.  ST to follow as per POC.   HPI HPI: Mr [name]. [place], 68y/m, presented to ED with incoherent speech, reportedly weaker on the right side and unable to sit or stand. PMH of schizophrenia, Parkinson's disease, intellectual disability, dementia and h/o DVT. At baseline pt lives in SNF, mostly verbal, ambulates w/o assistance, able to feed himself. No known h/o dysphagia. CT head positive for subdural hematoma with right midline shift.  Neurosurgery was consulted and felt that this was a subacute or chronic change.      SLP Plan  Continue with current plan of care       Recommendations  Diet recommendations: Dysphagia 1 (puree);Thin liquid Liquids provided via: Cup;Straw Medication Administration: Crushed with puree Supervision: Full supervision/cueing for compensatory strategies;Trained caregiver to feed  patient;Staff to assist with self feeding Compensations: Slow rate;Small sips/bites;Monitor for anterior loss;Follow solids with liquid Postural Changes and/or Swallow Maneuvers: Seated upright 90 degrees;Upright 30-60 min after meal                Oral Care Recommendations: Oral care BID;Staff/trained caregiver to provide oral care Follow up Recommendations: Skilled Nursing facility;24 hour supervision/assistance SLP Visit Diagnosis: Dysphagia, oropharyngeal phase (R13.12) Plan: Continue with current plan of care       [place], M.Ed., [place] [facility] Acute Rehabilitation [phone]: Acute Rehab office [phone] - pager    [name] 03/16/2019, 2:29 PM

## 2019-03-16 NOTE — Progress Notes (Signed)
PROGRESS NOTE    [name]  ZTI:[id number] DOB: [birth year] DOA: 03/08/2019 PCP: Rosita Fire, MD   Brief Narrative:  [name] is a 69 year old male with past medical history significant for schizophrenia, Parkinson's disease, intellectual disability, dementia, history of DVT on warfarin who presented from skilled nursing facility due to altered mental status.  At baseline, he is alert and oriented to self, mostly nonverbal, ambulates without assistance, able to feed himself.  However at the nursing facility they noted that he was unable to sit or stand, had incoherent speech and reportedly weaker on the right side.  Work-up revealed CT head positive for subdural hematoma with right midline shift.  Neurosurgery was consulted and felt that this was a subacute or chronic change.  Recommended to hold anticoagulation.  Due to sepsis and septic shock, patient was started on Levophed and broad-spectrum antibiotics, transferred to [facility] for further evaluation and management under PCCM service.  He was weaned off pressors and transferred to [facility] 1/14.    Assessment & Plan:   Active Problems:   Hypotension   Encephalopathy   Pressure injury of skin   Subdural hematoma (HCC)   Palliative care by specialist   Goals of care, counseling/discussion   Delirium   Muscular weakness   Failure to thrive in adult   Full code status   Xerostomia   Spiritual or religious counseling   Anemia   Community acquired pneumonia   1 septic shock secondary to suspected right-sided pneumonia, POA Patient had presented in septic shock admitted to the critical care service and had to be placed on pressors of Levophed.  MRSA swab was negative.  Influenza PCR and Covid 19 PCR negative.  Urine strep pneumococcus antigen was negative.  Blood cultures with no growth to date.  Chest x-ray which was done showed subtle asymmetric opacity in the right upper lobe new from previous exam.   Vancomycin has been discontinued.  Patient improving clinically.  Patient afebrile.  Status post 7 days IV cefepime.  Blood pressure stable.  Follow.  2.  Anemia of chronic disease/folate deficiency On admission patient noted to have a hemoglobin of 5.5.  Patient with no overt bleeding noted.  Status post 2 units packed red blood cells.  Anemia panel consistent with anemia of chronic disease as well as folate deficiency.  Hemoglobin currently stable at 8.2.  Place on folic acid 1 mg daily.  Outpatient follow-up.  3.  Acute metabolic encephalopathy Improved.  Patient likely close to baseline.  Patient mostly nonverbal with some incoherent mumbling but noted to be able to feed himself and ambulate.  Patient noted to have been residing in a group home prior to admission.  Follow.  4.  Chronic kidney disease stage IIIa Stable.  5.  Left chronic subdural hematoma Hematoma noted on head CT from 03/07/2018 that showed interval development of a thin intermediate density overlying the left cerebral convexity measuring 9 mm in thickness.  Mild mass-effect upon underlying left cerebral hemisphere with a 2 mm rightward midline shift.  Repeat head CT done 03/09/2019 with 9 mm extra-axial fluid collection left is greater compared with prior head CT now measuring up to 9 mm findings compatible with chronic subdural hematoma.  Slight increase in midline shift to the right now 3 mm.  Prior MD, Dr. Maylene Roes, per her note stated that neurosurgery recommended nonsurgical management and discontinuation of anticoagulation.  Patient's Coumadin has subsequently been discontinued.  6.  History of right lower extremity  DVT September 2020 Felt to be provoked in the setting of immobilization and treatment for cellulitis from August 2020.  Patient presented with a subdural hematoma and as such anticoagulation has been discontinued.  Outpatient follow-up.  7.  Parkinson's disease/history of schizophrenia Stable.  Continue amantadine,  BuSpar, Sinemet, Klonopin, Clozaril, rasagiline.  8.  Hyperlipidemia Continue statin.  9.  Mid stage II pressure injury anus 03/09/2019, POA Continue current wound care.    DVT prophylaxis: SCDs Code Status: Full Family Communication: No family at bedside. Disposition Plan: Skilled nursing facility   Consultants:   PCCM admission  Palliative care--Michelle Ferolito, NP 03/12/2019  Procedures:   CT on 03/09/2019  CT head CT C-spine 03/08/2019  Chest x-ray 03/08/2019  Transfusion of 2 units packed red blood cells.  Antimicrobials:  IV cefepime 03/08/2019>>>> 03/15/2019  IV azithromycin x1 dose 03/08/2019  IV vancomycin 03/08/2019>>>>> 03/10/2019   Subjective: Sleeping.  Opens eyes to verbal stimuli.  Grunting somewhat otherwise mostly nonverbal.  Objective: Vitals:   03/15/19 1841 03/15/19 2005 03/15/19 2022 03/16/19 0801  BP: (!) 143/94 (!) 146/80 105/72 104/82  Pulse: 70 67 67 (!) 59  Resp: 18 18 16 12   Temp: 98.3 F (36.8 C) 98.9 F (37.2 C) 98 F (36.7 C) 97.7 F (36.5 C)  TempSrc: Oral Oral Oral Oral  SpO2: 100% 95% 96% 94%  Weight:  73.3 kg      Intake/Output Summary (Last 24 hours) at 03/16/2019 1847 Last data filed at 03/16/2019 1845 Gross per 24 hour  Intake 1097 ml  Output 1050 ml  Net 47 ml   Filed Weights   03/13/19 0500 03/13/19 2134 03/15/19 2005  Weight: 72.5 kg 72.5 kg 73.3 kg    Examination:  General exam: Appears calm and comfortable  Respiratory system: Coarse BS anterior lung field. Respiratory effort normal. Cardiovascular system: S1 & S2 heard, RRR. No JVD, murmurs, rubs, gallops or clicks. No pedal edema. Gastrointestinal system: Abdomen is nondistended, soft and nontender. No organomegaly or masses felt. Normal bowel sounds heard. Central nervous system: Alert and oriented. No focal neurological deficits. Extremities: Symmetric 5 x 5 power. Skin: No rashes, lesions or ulcers Psychiatry: Judgement and insight appear poor to  fair. Mood & affect appropriate.     Data Reviewed: I have personally reviewed following labs and imaging studies  CBC: Recent Labs  Lab 03/09/19 2355 03/11/19 0339 03/12/19 0420 03/13/19 0429 03/16/19 0304  WBC 13.5* 9.7 9.2 10.6* 11.9*  HGB 9.0* 8.0* 7.4* 7.9* 8.2*  HCT 30.9* 27.1* 25.2* 27.3* 28.5*  MCV 92.2 91.6 90.0 91.3 91.1  PLT 313 291 251 282 767   Basic Metabolic Panel: Recent Labs  Lab 03/09/19 2355 03/11/19 0339 03/12/19 0420 03/16/19 0304  NA 145 148* 144 143  K 4.5 4.1 3.7 3.8  CL 114* 112* 111 112*  CO2 23 26 27 26   GLUCOSE 89 74 78 83  BUN 15 11 13 16   CREATININE 1.49* 1.42* 1.38* 1.07  CALCIUM 8.4* 8.5* 8.0* 8.3*   GFR: Estimated Creatinine Clearance: 67.6 mL/min (by C-G formula based on SCr of 1.07 mg/dL). Liver Function Tests: No results for input(s): AST, ALT, ALKPHOS, BILITOT, PROT, ALBUMIN in the last 168 hours. No results for input(s): LIPASE, AMYLASE in the last 168 hours. No results for input(s): AMMONIA in the last 168 hours. Coagulation Profile: Recent Labs  Lab 03/10/19 0915 03/11/19 0339 03/12/19 0420 03/13/19 0429  INR 3.9* 1.9* 1.5* 1.4*   Cardiac Enzymes: No results for input(s): CKTOTAL, CKMB, CKMBINDEX,  TROPONINI in the last 168 hours. BNP (last 3 results) No results for input(s): PROBNP in the last 8760 hours. HbA1C: No results for input(s): HGBA1C in the last 72 hours. CBG: Recent Labs  Lab 03/12/19 2118 03/13/19 0703 03/13/19 1132 03/13/19 2133 03/14/19 0657  GLUCAP 83 70 155* 112* 78   Lipid Profile: No results for input(s): CHOL, HDL, LDLCALC, TRIG, CHOLHDL, LDLDIRECT in the last 72 hours. Thyroid Function Tests: No results for input(s): TSH, T4TOTAL, FREET4, T3FREE, THYROIDAB in the last 72 hours. Anemia Panel: No results for input(s): VITAMINB12, FOLATE, FERRITIN, TIBC, IRON, RETICCTPCT in the last 72 hours. Sepsis Labs: Recent Labs  Lab 03/10/19 0533  PROCALCITON 0.25    Recent Results (from the  past 240 hour(s))  Culture, blood (Routine x 2)     Status: None   Collection Time: 03/08/19 12:23 PM   Specimen: BLOOD RIGHT WRIST  Result Value Ref Range Status   Specimen Description BLOOD RIGHT WRIST  Final   Special Requests   Final    BOTTLES DRAWN AEROBIC AND ANAEROBIC Blood Culture adequate volume   Culture   Final    NO GROWTH 6 DAYS Performed at [facility], [address], [place], [place] [address]    Report Status 03/14/2019 FINAL  Final  Culture, blood (Routine x 2)     Status: None   Collection Time: 03/08/19 12:24 PM   Specimen: BLOOD RIGHT FOREARM  Result Value Ref Range Status   Specimen Description BLOOD RIGHT FOREARM  Final   Special Requests   Final    BOTTLES DRAWN AEROBIC AND ANAEROBIC Blood Culture adequate volume   Culture   Final    NO GROWTH 6 DAYS Performed at [facility], [address] [place], [place] [address]    Report Status 03/14/2019 FINAL  Final  MRSA PCR Screening     Status: None   Collection Time: 03/08/19  2:35 PM   Specimen: Nasal Mucosa; Nasopharyngeal  Result Value Ref Range Status   MRSA by PCR NEGATIVE NEGATIVE Final    Comment:        The GeneXpert MRSA Assay (FDA approved for NASAL specimens only), is one component of a comprehensive MRSA colonization surveillance program. It is not intended to diagnose MRSA infection nor to guide or monitor treatment for MRSA infections. Performed at [facility], [address] [place], [place] [address]   Respiratory Panel by RT PCR (Flu A&B, Covid) - Nasopharyngeal Swab     Status: None   Collection Time: 03/08/19  2:55 PM   Specimen: Nasopharyngeal Swab  Result Value Ref Range Status   SARS Coronavirus 2 by RT PCR NEGATIVE NEGATIVE Final    Comment: (NOTE) SARS-CoV-2 target nucleic acids are NOT DETECTED. The SARS-CoV-2 RNA is generally detectable in upper respiratoy specimens during the acute phase of infection. The lowest concentration of SARS-CoV-2 viral copies this assay can  detect is 131 copies/mL. A negative result does not preclude SARS-Cov-2 infection and should not be used as the sole basis for treatment or other patient management decisions. A negative result may occur with  improper specimen collection/handling, submission of specimen other than nasopharyngeal swab, presence of viral mutation(s) within the areas targeted by this assay, and inadequate number of viral copies (<131 copies/mL). A negative result must be combined with clinical observations, patient history, and epidemiological information. The expected result is Negative. Fact Sheet for Patients:  PinkCheek.be Fact Sheet for Healthcare Providers:  GravelBags.it This test is not yet ap proved  or cleared by the [place] and  has been authorized for detection and/or diagnosis of SARS-CoV-2 by FDA under an Emergency Use Authorization (EUA). This EUA will remain  in effect (meaning this test can be used) for the duration of the COVID-19 declaration under Section 564(b)(1) of the Act, 21 U.S.C. section 360bbb-3(b)(1), unless the authorization is terminated or revoked sooner.    Influenza A by PCR NEGATIVE NEGATIVE Final   Influenza B by PCR NEGATIVE NEGATIVE Final    Comment: (NOTE) The Xpert Xpress SARS-CoV-2/FLU/RSV assay is intended as an aid in  the diagnosis of influenza from Nasopharyngeal swab specimens and  should not be used as a sole basis for treatment. Nasal washings and  aspirates are unacceptable for Xpert Xpress SARS-CoV-2/FLU/RSV  testing. Fact Sheet for Patients: PinkCheek.be Fact Sheet for Healthcare Providers: GravelBags.it This test is not yet approved or cleared by the [place] FDA and  has been authorized for detection and/or diagnosis of SARS-CoV-2 by  FDA under an Emergency Use Authorization (EUA). This EUA will remain  in effect (meaning  this test can be used) for the duration of the  Covid-19 declaration under Section 564(b)(1) of the Act, 21  U.S.C. section 360bbb-3(b)(1), unless the authorization is  terminated or revoked. Performed at [facility], [address] [place], [place] [address]   MRSA PCR Screening     Status: None   Collection Time: 03/09/19  3:04 AM   Specimen: Nasopharyngeal  Result Value Ref Range Status   MRSA by PCR NEGATIVE NEGATIVE Final    Comment:        The GeneXpert MRSA Assay (FDA approved for NASAL specimens only), is one component of a comprehensive MRSA colonization surveillance program. It is not intended to diagnose MRSA infection nor to guide or monitor treatment for MRSA infections. Performed at [facility], [place] [address], [place], [place] [address]          Radiology Studies: No results found.      Scheduled Meds:  amantadine  100 mg Oral BID   busPIRone  15 mg Oral BID   carbidopa-levodopa  2 tablet Oral QID   clozapine  50 mg Oral QHS   dextrose  1 ampule Intravenous Once   feeding supplement (ENSURE ENLIVE)  237 mL Oral BID BM   folic acid  1 mg Oral Daily   magnesium citrate  1 Bottle Oral Once   rasagiline  1 mg Oral Daily   simvastatin  20 mg Oral Daily   Continuous Infusions:  sodium chloride Stopped (03/14/19 2315)     LOS: 8 days    Time spent: 35 minutes    Irine Seal, MD [facility]  If 7PM-7AM, please contact night-coverage 03/16/2019, 6:47 PM

## 2019-03-16 NOTE — Progress Notes (Signed)
Occupational Therapy Treatment Patient Details Name: [name] MRN: [id number] DOB: 02-May-1950 Today's Date: 03/16/2019    History of present illness 69 year old male with past medical history significant for schizophrenia, Parkinson's disease, intellectual disability, dementia, history of DVT on warfarin who presented from skilled nursing facility due to altered mental status. Pt found to have sub-acute vs chronic L SDH, along with septic shock 2/2 R PNA.   OT comments  Patient progressing slowly towards OT goals.  Completed bed mobility with max assist today, EOB sitting to complete washing face with min assist for balance due to R lateral and posterior lean.  Transitioned to sitting supported in bed for eating with mod assist and UE exercises.  Edema noted in B hands and positioned on pillows to support.  Patient requires max cueing for initation, sequencing and problem solving during session.  Will follow.    Follow Up Recommendations  SNF;Supervision/Assistance - 24 hour    Equipment Recommendations  Other (comment)(TBD at next venue of care)    Recommendations for Other Services      Precautions / Restrictions Precautions Precautions: Fall Restrictions Weight Bearing Restrictions: No       Mobility Bed Mobility Overal bed mobility: Needs Assistance Bed Mobility: Supine to Sit;Sit to Supine     Supine to sit: Max assist Sit to supine: Max assist   General bed mobility comments: max assist to initate and sequence, physically support LBs and trunk to/from EOB  Transfers                 General transfer comment: deferred    Balance Overall balance assessment: Needs assistance Sitting-balance support: No upper extremity supported;Feet supported Sitting balance-Leahy Scale: Poor Sitting balance - Comments: posterior and R lateral lean, able to correct with verbal cueing and min support but unable to sustain  Postural control: Posterior lean;Right  lateral lean                                 ADL either performed or assessed with clinical judgement   ADL Overall ADL's : Needs assistance/impaired Eating/Feeding: Moderate assistance;Sitting Eating/Feeding Details (indicate cue type and reason): sitting HOB raised to 50-60 degrees, using R UE to scoop and bring to mouth with mod assist to control spoon and max cueing for very small bite size; moderate spillage and fatigues quickly; used B UEs to bring cup to mouth --will benefit from built up handles and handled cup (RN notified of fatigue and reports she would come to assist with remainder of meal) Grooming: Minimal assistance;Sitting;Wash/dry face Grooming Details (indicate cue type and reason): sitting EOB with min assist for balance to wash face with setup assist                              Functional mobility during ADLs: Moderate assistance General ADL Comments: continues to present with posterior and R lateral lean at EOB      Vision       Perception     Praxis      Cognition Arousal/Alertness: Awake/alert Behavior During Therapy: Flat affect Overall Cognitive Status: History of cognitive impairments - at baseline                                 General Comments: able to follow limited simple commands  given increased time         Exercises Exercises: General Upper Extremity(completed seated supported in bed) General Exercises - Upper Extremity Shoulder Flexion: AAROM;Both;Supine;5 reps Shoulder Extension: AAROM;Both;5 reps;Supine Elbow Flexion: AAROM;Both;5 reps;Supine Elbow Extension: AAROM;Both;5 reps;Supine Digit Composite Flexion: AAROM;Both;5 reps;Supine Composite Extension: AAROM;Both;5 reps;Supine   Shoulder Instructions       General Comments      Pertinent Vitals/ Pain       Pain Assessment: Faces Faces Pain Scale: No hurt  Home Living                                          Prior  Functioning/Environment              Frequency  Min 2X/week        Progress Toward Goals  OT Goals(current goals can now be found in the care plan section)  Progress towards OT goals: Progressing toward goals  Acute Rehab OT Goals Patient Stated Goal: unable to state OT Goal Formulation: Patient unable to participate in goal setting  Plan Discharge plan remains appropriate;Frequency remains appropriate    Co-evaluation                 AM-PAC OT "6 Clicks" Daily Activity     Outcome Measure   Help from another person eating meals?: A Lot Help from another person taking care of personal grooming?: A Lot Help from another person toileting, which includes using toliet, bedpan, or urinal?: Total Help from another person bathing (including washing, rinsing, drying)?: Total Help from another person to put on and taking off regular upper body clothing?: Total Help from another person to put on and taking off regular lower body clothing?: Total 6 Click Score: 8    End of Session    OT Visit Diagnosis: Other abnormalities of gait and mobility (R26.89);Cognitive communication deficit (R41.841);Other symptoms and signs involving cognitive function;Muscle weakness (generalized) (M62.81);Other symptoms and signs involving the nervous system (R29.898) Symptoms and signs involving cognitive functions: Other cerebrovascular disease   Activity Tolerance Patient tolerated treatment well   Patient Left in bed;with call bell/phone within reach;with bed alarm set   Nurse Communication Mobility status;Other (comment)(assist with feeding)        Time: 1779-3903 OT Time Calculation (min): 30 min  Charges: OT General Charges $OT Visit: 1 Visit OT Treatments $Self Care/Home Management : 23-37 mins  [place] Pager [phone] Office [phone]    Delight Stare 03/16/2019, 10:02 AM

## 2019-03-17 LAB — BASIC METABOLIC PANEL
Anion gap: 7 (ref 5–15)
BUN: 17 mg/dL (ref 8–23)
CO2: 27 mmol/L (ref 22–32)
Calcium: 8.5 mg/dL — ABNORMAL LOW (ref 8.9–10.3)
Chloride: 112 mmol/L — ABNORMAL HIGH (ref 98–111)
Creatinine, Ser: 0.9 mg/dL (ref 0.61–1.24)
GFR calc Af Amer: >60 mL/min (ref >60)
GFR calc non Af Amer: >60 mL/min (ref >60)
Glucose, Bld: 90 mg/dL (ref 70–99)
Potassium: 4 mmol/L (ref 3.5–5.1)
Sodium: 146 mmol/L — ABNORMAL HIGH (ref 135–145)

## 2019-03-17 LAB — SARS CORONAVIRUS 2 (TAT 6-24 HRS): SARS Coronavirus 2: NEGATIVE

## 2019-03-17 LAB — CBC
HCT: 28.5 % — ABNORMAL LOW (ref 39.0–52.0)
Hemoglobin: 8.3 g/dL — ABNORMAL LOW (ref 13.0–17.0)
MCH: 26.1 pg (ref 26.0–34.0)
MCHC: 29.1 g/dL — ABNORMAL LOW (ref 30.0–36.0)
MCV: 89.6 fL (ref 80.0–100.0)
Platelets: 303 10*3/uL (ref 150–400)
RBC: 3.18 MIL/uL — ABNORMAL LOW (ref 4.22–5.81)
RDW: 16.6 % — ABNORMAL HIGH (ref 11.5–15.5)
WBC: 12.3 10*3/uL — ABNORMAL HIGH (ref 4.0–10.5)
nRBC: 0 % (ref 0.0–0.2)

## 2019-03-17 NOTE — Progress Notes (Signed)
SLP Cancellation Note  Patient Details Name: [name] MRN: [id number] DOB: 1950/07/09   Cancelled treatment:       Reason Eval/Treat Not Completed: Fatigue/lethargy limiting ability to participate  ST treatment attempted. Patient sleeping, ST unable to rouse patient to an appropriate level to participate in [place]. Will re-attempt as schedule allows.  Marina Goodell, M.Ed., CCC-SLP Speech Therapy Acute Rehabilitation [phone]: Acute Rehab office [phone] - pager   Marina Goodell 03/17/2019, 11:43 AM

## 2019-03-17 NOTE — TOC Progression Note (Signed)
Transition of Care [facility]) - Progression Note    Patient Details  Name: [name] MRN: [id number] Date of Birth: 11/11/1950  [facility] of [facility] CM/SW Contact  Sharlet Salina Mila Homer, LCSW Phone Number: 03/17/2019, 1:53 PM  Clinical Narrative:  Bronson Ing, admissions director with [facility] in [facility] as they made a bed offer. She can accept patient and will need COVID test. Contacted [facility] and patient not managed by them. Levada Dy advised regarding insurance and also that [place] number is pending (Level II) and that Financial planner Karena Addison is working on it. Secure chat sent to MD requesting  COVID test.  Brother Dung Salinger [phone]) and Ms. Mary [phone]) contacted and updated regarding facility and d/c on Friday.      Expected Discharge Plan: [place] Barriers to Discharge: Continued Medical Work up  Expected Discharge Plan and Services Expected Discharge Plan: [place] In-house Referral: Clinical Social Work     Living arrangements for the past 2 months: Assisted Living Facility([facility] in [place])                                       Social Determinants of Health (SDOH) Interventions    Readmission Risk Interventions Readmission Risk Prevention Plan 02/11/2019  Transportation Screening Complete  Medication Review Press photographer) Complete  Palliative Care Screening Not Applicable  Some recent data might be hidden

## 2019-03-17 NOTE — Progress Notes (Signed)
PROGRESS NOTE    [name]  VEL:[id number] DOB: November 29, 1950 DOA: 03/08/2019 PCP: Rosita Fire, MD   Brief Narrative:  [name] is a 69 year old male with past medical history significant for schizophrenia, Parkinson's disease, intellectual disability, dementia, history of DVT on warfarin who presented from skilled nursing facility due to altered mental status.  At baseline, he is alert and oriented to self, mostly nonverbal, ambulates without assistance, able to feed himself.  However at the nursing facility they noted that he was unable to sit or stand, had incoherent speech and reportedly weaker on the right side.  Work-up revealed CT head positive for subdural hematoma with right midline shift.  Neurosurgery was consulted and felt that this was a subacute or chronic change.  Recommended to hold anticoagulation.  Due to sepsis and septic shock, patient was started on Levophed and broad-spectrum antibiotics, transferred to [facility] for further evaluation and management under PCCM service.  He was weaned off pressors and transferred to [facility] 1/14.    Assessment & Plan:   Active Problems:   Hypotension   Encephalopathy   Pressure injury of skin   Subdural hematoma (HCC)   Palliative care by specialist   Goals of care, counseling/discussion   Delirium   Muscular weakness   Failure to thrive in adult   Full code status   Xerostomia   Spiritual or religious counseling   Anemia   Community acquired pneumonia   1 septic shock secondary to suspected right-sided pneumonia, POA Patient had presented in septic shock admitted to the critical care service and had to be placed on pressors of Levophed.  MRSA swab was negative.  Influenza PCR and Covid 19 PCR negative.  Urine strep pneumococcus antigen was negative.  Blood cultures with no growth to date.  Chest x-ray which was done showed subtle asymmetric opacity in the right upper lobe new from previous exam.   Vancomycin has been discontinued.  Patient improving clinically.  Patient afebrile.  Status post 7 days IV cefepime.  Follow.  2.  Anemia of chronic disease/folate deficiency On admission patient noted to have a hemoglobin of 5.5.  Patient with no overt bleeding noted.  Status post 2 units packed red blood cells.  Anemia panel consistent with anemia of chronic disease as well as folate deficiency.  Hemoglobin currently stable at 8.3.  Patient started on folic acid 1 mg daily.  Outpatient follow-up.   3.  Acute metabolic encephalopathy Patient likely close to baseline.  Patient noted to be mostly nonverbal with some incoherent mumbling/grunting.  It is noted that patient able to feed himself and ambulate.  Patient noted to have been residing in a group home prior to admission.  Follow.  4.  Chronic kidney disease stage IIIa Stable.  5.  Left chronic subdural hematoma Hematoma noted on head CT from 03/07/2018 that showed interval development of a thin intermediate density overlying the left cerebral convexity measuring 9 mm in thickness.  Mild mass-effect upon underlying left cerebral hemisphere with a 2 mm rightward midline shift.  Repeat head CT done 03/09/2019 with 9 mm extra-axial fluid collection left is greater compared with prior head CT now measuring up to 9 mm findings compatible with chronic subdural hematoma.  Slight increase in midline shift to the right now 3 mm.  Prior MD, Dr. Maylene Roes, per her note stated that neurosurgery recommended nonsurgical management and discontinuation of anticoagulation.  Coumadin has subsequently been discontinued and will not be resumed on discharge.  6.  History of right lower extremity DVT September 2020 Felt to be provoked in the setting of immobilization and treatment for cellulitis from August 2020.  Patient presented with a subdural hematoma and as such anticoagulation has been discontinued.  Outpatient follow-up.  7.  Parkinson's disease/history of  schizophrenia Continue amantadine, BuSpar, Sinemet, Klonopin, Clozaril, rasagiline.   8.  Hyperlipidemia Statin.    9.  Mid stage II pressure injury anus 03/09/2019, POA Continue current wound care.    DVT prophylaxis: SCDs Code Status: Full Family Communication: No family at bedside. Disposition Plan: Skilled nursing facility hopefully tomorrow   Consultants:   PCCM admission  Palliative care--Michelle Ferolito, NP 03/12/2019  Procedures:   CT on 03/09/2019  CT head CT C-spine 03/08/2019  Chest x-ray 03/08/2019  Transfusion of 2 units packed red blood cells.  Antimicrobials:  IV cefepime 03/08/2019>>>> 03/15/2019  IV azithromycin x1 dose 03/08/2019  IV vancomycin 03/08/2019>>>>> 03/10/2019   Subjective: Patient sleeping but arousable.  When asked whether he short of breath patient with some grunting/incomprehensible noises.  Mostly noted to be nonverbal.   Objective: Vitals:   03/16/19 0801 03/16/19 2108 03/17/19 0452 03/17/19 0926  BP: 104/82 138/76 138/84 111/71  Pulse: (!) 59 69 71 64  Resp: 12 18 16 17   Temp: 97.7 F (36.5 C) 98 F (36.7 C) 98.2 F (36.8 C) 98.9 F (37.2 C)  TempSrc: Oral Oral    SpO2: 94% 93% 100% 95%  Weight:  73.3 kg      Intake/Output Summary (Last 24 hours) at 03/17/2019 1231 Last data filed at 03/17/2019 0109 Gross per 24 hour  Intake 657 ml  Output 600 ml  Net 57 ml   Filed Weights   03/13/19 2134 03/15/19 2005 03/16/19 2108  Weight: 72.5 kg 73.3 kg 73.3 kg    Examination:  General exam: NAD Respiratory system: Coarse breath sounds anterior lung fields.  Normal respiratory effort.  Cardiovascular system: Regular rate and rhythm no murmurs rubs or gallops.  No JVD.  No lower extremity edema.   Gastrointestinal system: Abdomen is soft, nontender, nondistended, positive bowel sounds.  No rebound.  No guarding.  Central nervous system: Sleeping but arousable.. No focal neurological deficits. Extremities: Symmetric 5 x 5  power. Skin: No rashes, lesions or ulcers Psychiatry: Judgement and insight appear poor to fair. Mood & affect appropriate.     Data Reviewed: I have personally reviewed following labs and imaging studies  CBC: Recent Labs  Lab 03/11/19 0339 03/12/19 0420 03/13/19 0429 03/16/19 0304 03/17/19 0308  WBC 9.7 9.2 10.6* 11.9* 12.3*  HGB 8.0* 7.4* 7.9* 8.2* 8.3*  HCT 27.1* 25.2* 27.3* 28.5* 28.5*  MCV 91.6 90.0 91.3 91.1 89.6  PLT 291 251 282 285 323   Basic Metabolic Panel: Recent Labs  Lab 03/11/19 0339 03/12/19 0420 03/16/19 0304 03/17/19 0308  NA 148* 144 143 146*  K 4.1 3.7 3.8 4.0  CL 112* 111 112* 112*  CO2 26 27 26 27   GLUCOSE 74 78 83 90  BUN 11 13 16 17   CREATININE 1.42* 1.38* 1.07 0.90  CALCIUM 8.5* 8.0* 8.3* 8.5*   GFR: Estimated Creatinine Clearance: 80.3 mL/min (by C-G formula based on SCr of 0.9 mg/dL). Liver Function Tests: No results for input(s): AST, ALT, ALKPHOS, BILITOT, PROT, ALBUMIN in the last 168 hours. No results for input(s): LIPASE, AMYLASE in the last 168 hours. No results for input(s): AMMONIA in the last 168 hours. Coagulation Profile: Recent Labs  Lab 03/11/19 0339 03/12/19 0420  03/13/19 0429  INR 1.9* 1.5* 1.4*   Cardiac Enzymes: No results for input(s): CKTOTAL, CKMB, CKMBINDEX, TROPONINI in the last 168 hours. BNP (last 3 results) No results for input(s): PROBNP in the last 8760 hours. HbA1C: No results for input(s): HGBA1C in the last 72 hours. CBG: Recent Labs  Lab 03/12/19 2118 03/13/19 0703 03/13/19 1132 03/13/19 2133 03/14/19 0657  GLUCAP 83 70 155* 112* 78   Lipid Profile: No results for input(s): CHOL, HDL, LDLCALC, TRIG, CHOLHDL, LDLDIRECT in the last 72 hours. Thyroid Function Tests: No results for input(s): TSH, T4TOTAL, FREET4, T3FREE, THYROIDAB in the last 72 hours. Anemia Panel: No results for input(s): VITAMINB12, FOLATE, FERRITIN, TIBC, IRON, RETICCTPCT in the last 72 hours. Sepsis Labs: No results  for input(s): PROCALCITON, LATICACIDVEN in the last 168 hours.  Recent Results (from the past 240 hour(s))  Culture, blood (Routine x 2)     Status: None   Collection Time: 03/08/19 12:23 PM   Specimen: BLOOD RIGHT WRIST  Result Value Ref Range Status   Specimen Description BLOOD RIGHT WRIST  Final   Special Requests   Final    BOTTLES DRAWN AEROBIC AND ANAEROBIC Blood Culture adequate volume   Culture   Final    NO GROWTH 6 DAYS Performed at [facility], [address] [place], [place] [address]    Report Status 03/14/2019 FINAL  Final  Culture, blood (Routine x 2)     Status: None   Collection Time: 03/08/19 12:24 PM   Specimen: BLOOD RIGHT FOREARM  Result Value Ref Range Status   Specimen Description BLOOD RIGHT FOREARM  Final   Special Requests   Final    BOTTLES DRAWN AEROBIC AND ANAEROBIC Blood Culture adequate volume   Culture   Final    NO GROWTH 6 DAYS Performed at [facility], [address], [place], [place] [address]    Report Status 03/14/2019 FINAL  Final  MRSA PCR Screening     Status: None   Collection Time: 03/08/19  2:35 PM   Specimen: Nasal Mucosa; Nasopharyngeal  Result Value Ref Range Status   MRSA by PCR NEGATIVE NEGATIVE Final    Comment:        The GeneXpert MRSA Assay (FDA approved for NASAL specimens only), is one component of a comprehensive MRSA colonization surveillance program. It is not intended to diagnose MRSA infection nor to guide or monitor treatment for MRSA infections. Performed at [facility], [address], [place], [place] [address]   Respiratory Panel by RT PCR (Flu A&B, Covid) - Nasopharyngeal Swab     Status: None   Collection Time: 03/08/19  2:55 PM   Specimen: Nasopharyngeal Swab  Result Value Ref Range Status   SARS Coronavirus 2 by RT PCR NEGATIVE NEGATIVE Final    Comment: (NOTE) SARS-CoV-2 target nucleic acids are NOT DETECTED. The SARS-CoV-2 RNA is generally detectable in upper respiratoy specimens during the  acute phase of infection. The lowest concentration of SARS-CoV-2 viral copies this assay can detect is 131 copies/mL. A negative result does not preclude SARS-Cov-2 infection and should not be used as the sole basis for treatment or other patient management decisions. A negative result may occur with  improper specimen collection/handling, submission of specimen other than nasopharyngeal swab, presence of viral mutation(s) within the areas targeted by this assay, and inadequate number of viral copies (<131 copies/mL). A negative result must be combined with clinical observations, patient history, and epidemiological information. The expected result is Negative. Fact Sheet  for Patients:  PinkCheek.be Fact Sheet for Healthcare Providers:  GravelBags.it This test is not yet ap proved or cleared by the [place] FDA and  has been authorized for detection and/or diagnosis of SARS-CoV-2 by FDA under an Emergency Use Authorization (EUA). This EUA will remain  in effect (meaning this test can be used) for the duration of the COVID-19 declaration under Section 564(b)(1) of the Act, 21 U.S.C. section 360bbb-3(b)(1), unless the authorization is terminated or revoked sooner.    Influenza A by PCR NEGATIVE NEGATIVE Final   Influenza B by PCR NEGATIVE NEGATIVE Final    Comment: (NOTE) The Xpert Xpress SARS-CoV-2/FLU/RSV assay is intended as an aid in  the diagnosis of influenza from Nasopharyngeal swab specimens and  should not be used as a sole basis for treatment. Nasal washings and  aspirates are unacceptable for Xpert Xpress SARS-CoV-2/FLU/RSV  testing. Fact Sheet for Patients: PinkCheek.be Fact Sheet for Healthcare Providers: GravelBags.it This test is not yet approved or cleared by the [place] FDA and  has been authorized for detection and/or diagnosis of SARS-CoV-2  by  FDA under an Emergency Use Authorization (EUA). This EUA will remain  in effect (meaning this test can be used) for the duration of the  Covid-19 declaration under Section 564(b)(1) of the Act, 21  U.S.C. section 360bbb-3(b)(1), unless the authorization is  terminated or revoked. Performed at [facility], [address], [place], [place] [address]   MRSA PCR Screening     Status: None   Collection Time: 03/09/19  3:04 AM   Specimen: Nasopharyngeal  Result Value Ref Range Status   MRSA by PCR NEGATIVE NEGATIVE Final    Comment:        The GeneXpert MRSA Assay (FDA approved for NASAL specimens only), is one component of a comprehensive MRSA colonization surveillance program. It is not intended to diagnose MRSA infection nor to guide or monitor treatment for MRSA infections. Performed at [facility], [place] [address], [place], [place] [address]          Radiology Studies: No results found.      Scheduled Meds:  amantadine  100 mg Oral BID   busPIRone  15 mg Oral BID   carbidopa-levodopa  2 tablet Oral QID   clozapine  50 mg Oral QHS   dextrose  1 ampule Intravenous Once   feeding supplement (ENSURE ENLIVE)  237 mL Oral BID BM   folic acid  1 mg Oral Daily   magnesium citrate  1 Bottle Oral Once   rasagiline  1 mg Oral Daily   simvastatin  20 mg Oral Daily   Continuous Infusions:  sodium chloride Stopped (03/14/19 2315)     LOS: 9 days    Time spent: 35 minutes    Irine Seal, MD [facility]  If 7PM-7AM, please contact night-coverage 03/17/2019, 12:31 PM

## 2019-03-18 DIAGNOSIS — F209 Schizophrenia, unspecified: Secondary | ICD-10-CM | POA: Diagnosis not present

## 2019-03-18 DIAGNOSIS — W050XXA Fall from non-moving wheelchair, initial encounter: Secondary | ICD-10-CM | POA: Diagnosis not present

## 2019-03-18 DIAGNOSIS — N189 Chronic kidney disease, unspecified: Secondary | ICD-10-CM | POA: Diagnosis not present

## 2019-03-18 DIAGNOSIS — E43 Unspecified severe protein-calorie malnutrition: Secondary | ICD-10-CM | POA: Diagnosis not present

## 2019-03-18 DIAGNOSIS — W06XXXA Fall from bed, initial encounter: Secondary | ICD-10-CM | POA: Diagnosis not present

## 2019-03-18 DIAGNOSIS — R9389 Abnormal findings on diagnostic imaging of other specified body structures: Secondary | ICD-10-CM | POA: Diagnosis not present

## 2019-03-18 DIAGNOSIS — J9692 Respiratory failure, unspecified with hypercapnia: Secondary | ICD-10-CM | POA: Diagnosis not present

## 2019-03-18 DIAGNOSIS — R569 Unspecified convulsions: Secondary | ICD-10-CM | POA: Diagnosis not present

## 2019-03-18 DIAGNOSIS — F0280 Dementia in other diseases classified elsewhere without behavioral disturbance: Secondary | ICD-10-CM

## 2019-03-18 DIAGNOSIS — F0390 Unspecified dementia without behavioral disturbance: Secondary | ICD-10-CM | POA: Diagnosis not present

## 2019-03-18 DIAGNOSIS — M2550 Pain in unspecified joint: Secondary | ICD-10-CM | POA: Diagnosis not present

## 2019-03-18 DIAGNOSIS — S0990XA Unspecified injury of head, initial encounter: Secondary | ICD-10-CM | POA: Diagnosis not present

## 2019-03-18 DIAGNOSIS — S0003XA Contusion of scalp, initial encounter: Secondary | ICD-10-CM | POA: Diagnosis not present

## 2019-03-18 DIAGNOSIS — I6203 Nontraumatic chronic subdural hemorrhage: Secondary | ICD-10-CM | POA: Diagnosis not present

## 2019-03-18 DIAGNOSIS — J9611 Chronic respiratory failure with hypoxia: Secondary | ICD-10-CM | POA: Diagnosis not present

## 2019-03-18 DIAGNOSIS — S01112A Laceration without foreign body of left eyelid and periocular area, initial encounter: Secondary | ICD-10-CM | POA: Diagnosis not present

## 2019-03-18 DIAGNOSIS — E785 Hyperlipidemia, unspecified: Secondary | ICD-10-CM | POA: Diagnosis not present

## 2019-03-18 DIAGNOSIS — G20 Parkinson's disease: Secondary | ICD-10-CM | POA: Diagnosis not present

## 2019-03-18 DIAGNOSIS — S199XXA Unspecified injury of neck, initial encounter: Secondary | ICD-10-CM | POA: Diagnosis not present

## 2019-03-18 DIAGNOSIS — A419 Sepsis, unspecified organism: Secondary | ICD-10-CM | POA: Diagnosis not present

## 2019-03-18 DIAGNOSIS — R64 Cachexia: Secondary | ICD-10-CM | POA: Diagnosis not present

## 2019-03-18 DIAGNOSIS — Z7401 Bed confinement status: Secondary | ICD-10-CM | POA: Diagnosis not present

## 2019-03-18 DIAGNOSIS — R531 Weakness: Secondary | ICD-10-CM | POA: Diagnosis not present

## 2019-03-18 DIAGNOSIS — D529 Folate deficiency anemia, unspecified: Secondary | ICD-10-CM | POA: Diagnosis not present

## 2019-03-18 DIAGNOSIS — Z20822 Contact with and (suspected) exposure to covid-19: Secondary | ICD-10-CM | POA: Diagnosis not present

## 2019-03-18 DIAGNOSIS — N1831 Chronic kidney disease, stage 3a: Secondary | ICD-10-CM | POA: Diagnosis not present

## 2019-03-18 DIAGNOSIS — L8992 Pressure ulcer of unspecified site, stage 2: Secondary | ICD-10-CM | POA: Diagnosis not present

## 2019-03-18 DIAGNOSIS — R410 Disorientation, unspecified: Secondary | ICD-10-CM | POA: Diagnosis not present

## 2019-03-18 DIAGNOSIS — M47812 Spondylosis without myelopathy or radiculopathy, cervical region: Secondary | ICD-10-CM | POA: Diagnosis not present

## 2019-03-18 DIAGNOSIS — G9341 Metabolic encephalopathy: Secondary | ICD-10-CM | POA: Diagnosis not present

## 2019-03-18 DIAGNOSIS — L97819 Non-pressure chronic ulcer of other part of right lower leg with unspecified severity: Secondary | ICD-10-CM | POA: Diagnosis not present

## 2019-03-18 DIAGNOSIS — S065X9A Traumatic subdural hemorrhage with loss of consciousness of unspecified duration, initial encounter: Secondary | ICD-10-CM | POA: Diagnosis not present

## 2019-03-18 DIAGNOSIS — R6521 Severe sepsis with septic shock: Secondary | ICD-10-CM | POA: Diagnosis not present

## 2019-03-18 DIAGNOSIS — R4182 Altered mental status, unspecified: Secondary | ICD-10-CM | POA: Diagnosis not present

## 2019-03-18 DIAGNOSIS — R296 Repeated falls: Secondary | ICD-10-CM | POA: Diagnosis not present

## 2019-03-18 DIAGNOSIS — S2231XA Fracture of one rib, right side, initial encounter for closed fracture: Secondary | ICD-10-CM | POA: Diagnosis not present

## 2019-03-18 DIAGNOSIS — G9340 Encephalopathy, unspecified: Secondary | ICD-10-CM | POA: Diagnosis not present

## 2019-03-18 DIAGNOSIS — L89150 Pressure ulcer of sacral region, unstageable: Secondary | ICD-10-CM | POA: Diagnosis not present

## 2019-03-18 DIAGNOSIS — D649 Anemia, unspecified: Secondary | ICD-10-CM | POA: Diagnosis not present

## 2019-03-18 DIAGNOSIS — Y998 Other external cause status: Secondary | ICD-10-CM | POA: Diagnosis not present

## 2019-03-18 DIAGNOSIS — J189 Pneumonia, unspecified organism: Secondary | ICD-10-CM | POA: Diagnosis not present

## 2019-03-18 DIAGNOSIS — R918 Other nonspecific abnormal finding of lung field: Secondary | ICD-10-CM | POA: Diagnosis not present

## 2019-03-18 DIAGNOSIS — R5381 Other malaise: Secondary | ICD-10-CM | POA: Diagnosis not present

## 2019-03-18 DIAGNOSIS — S0181XA Laceration without foreign body of other part of head, initial encounter: Secondary | ICD-10-CM | POA: Diagnosis not present

## 2019-03-18 DIAGNOSIS — L89154 Pressure ulcer of sacral region, stage 4: Secondary | ICD-10-CM | POA: Diagnosis not present

## 2019-03-18 DIAGNOSIS — Z23 Encounter for immunization: Secondary | ICD-10-CM | POA: Diagnosis not present

## 2019-03-18 DIAGNOSIS — W19XXXA Unspecified fall, initial encounter: Secondary | ICD-10-CM | POA: Diagnosis not present

## 2019-03-18 DIAGNOSIS — R627 Adult failure to thrive: Secondary | ICD-10-CM | POA: Diagnosis not present

## 2019-03-18 DIAGNOSIS — R1310 Dysphagia, unspecified: Secondary | ICD-10-CM | POA: Diagnosis not present

## 2019-03-18 MED ORDER — CLONAZEPAM 0.5 MG PO TABS: 0.2500 mg | ORAL_TABLET | Freq: Every day | ORAL | 0 refills | Status: AC | PRN

## 2019-03-18 MED ORDER — FOLIC ACID 1 MG PO TABS: 1.0000 mg | ORAL_TABLET | Freq: Every day | ORAL | Status: AC

## 2019-03-18 NOTE — Progress Notes (Signed)
Called report to [facility]. Christina, LPN.   Paulla Fore, RN, BSN

## 2019-03-18 NOTE — Progress Notes (Signed)
Physical Therapy Treatment Patient Details Name: [name] MRN: [id number] DOB: [birth year] Today's Date: 03/18/2019    History of Present Illness 69 year old male with past medical history significant for schizophrenia, Parkinson's disease, intellectual disability, dementia, history of DVT on warfarin who presented from skilled nursing facility due to altered mental status. Pt found to have sub-acute vs chronic L SDH, along with septic shock 2/2 R PNA.    PT Comments    Patient received in bed, sleeping, easily roused when called his name. Patient verbalizes but I am unable to understand anything he tries to say due to garbled speech. Patient participates in LE and UE strengthening/rom exercises. He follows direction, but is limited by weakness in ability to perform activities. He will continue to benefit from skilled PT while here to improve strength and functional mobility.     Follow Up Recommendations  SNF;Supervision/Assistance - 24 hour     Equipment Recommendations  None recommended by PT    Recommendations for Other Services       Precautions / Restrictions Precautions Precautions: Fall Restrictions Weight Bearing Restrictions: No    Mobility  Bed Mobility Overal bed mobility: Needs Assistance Bed Mobility: Sit to Supine;Supine to Sit     Supine to sit: Max assist Sit to supine: Max assist   General bed mobility comments: total assist +2  for repositioning utilizing bed controls for assistance in pulling pt. up in bed  Transfers                 General transfer comment: deferred  Ambulation/Gait             General Gait Details: unable   Stairs             Wheelchair Mobility    Modified Rankin (Stroke Patients Only)       Balance Overall balance assessment: Needs assistance   Sitting balance-Leahy Scale: Poor                                      Cognition Arousal/Alertness: Awake/alert   Overall  Cognitive Status: History of cognitive impairments - at baseline                                 General Comments: able to follow limited simple commands given increased time       Exercises General Exercises - Upper Extremity Shoulder Flexion: AAROM;Both;Supine;5 reps Shoulder Extension: AAROM;Both;5 reps;Supine Elbow Flexion: AAROM;Both;5 reps;Supine Elbow Extension: AAROM;Both;5 reps;Supine Digit Composite Flexion: AAROM;Both;5 reps;Supine Composite Extension: AAROM;Both;5 reps;Supine Other Exercises Other Exercises: mostly passisve ROM/strengthening of LE: ap, heel slides, SLR, hip abd/add x 10 reps. Other Exercises: Active UE exercises: raising arms, elbow flexion, open/close hands x 5 reps each    General Comments        Pertinent Vitals/Pain Pain Assessment: Faces Pain Score: 0-No pain Faces Pain Scale: Hurts even more Pain Location: moaning and said "ow" when bed mobility was being performed so specific location not identified Pain Intervention(s): Limited activity within patient's tolerance;Monitored during session;Repositioned    Home Living                      Prior Function            PT Goals (current goals can now be found in the care plan section) Acute  Rehab PT Goals Patient Stated Goal: unable to state PT Goal Formulation: Patient unable to participate in goal setting Time For Goal Achievement: 03/24/19 Potential to Achieve Goals: Fair Progress towards PT goals: Progressing toward goals    Frequency    Min 2X/week      PT Plan Current plan remains appropriate    Co-evaluation              AM-PAC PT "6 Clicks" Mobility   Outcome Measure  Help needed turning from your back to your side while in a flat bed without using bedrails?: Total Help needed moving from lying on your back to sitting on the side of a flat bed without using bedrails?: Total Help needed moving to and from a bed to a chair (including a  wheelchair)?: Total Help needed standing up from a chair using your arms (e.g., wheelchair or bedside chair)?: Total Help needed to walk in hospital room?: Total Help needed climbing 3-5 steps with a railing? : Total 6 Click Score: 6    End of Session   Activity Tolerance: Patient limited by lethargy Patient left: in bed;with call bell/phone within reach;with nursing/sitter in room Nurse Communication: Mobility status PT Visit Diagnosis: Other abnormalities of gait and mobility (R26.89);Difficulty in walking, not elsewhere classified (R26.2);Muscle weakness (generalized) (M62.81)     Time: 1215-1227 PT Time Calculation (min) (ACUTE ONLY): 12 min  Charges:  $Therapeutic Exercise: 8-22 mins                     Nastassja Witkop, PT, GCS 03/18/19,12:37 PM

## 2019-03-18 NOTE — Progress Notes (Signed)
  Speech Language Pathology Treatment: Dysphagia  Patient Details Name: [name] MRN: [id number] DOB: [birth year] Today's Date: 03/18/2019 Time: 8638-1771 SLP Time Calculation (min) (ACUTE ONLY): 16 min  Assessment / Plan / Recommendation Clinical Impression  Patient seen at bedside for skilled dysphagia treatment. Pt in bed, HOB raised so he was upright. Pt opening eyes, low volume speech noted. Pt able to follow simple commands. ST used toothette via in-line suction to clean oral cavity. Pt provided cup sips of thin liquids (water), pt able to hold cup with HOH assist. No overt s/s aspiration seen, audible swallow noted. Pt then seen with straw sips of thin liquid, pt with throat clearing/mild cough with straw sips. Pt seen with puree (applesauce), pt closing eyes with bolus in oral cavity, orally holding. ST provided cues for patient to swallow, pt opening eyes briefly, attempting a swallow after >5 sec bolus holding. ST provided cup sips of water for liquid rinse, pt able to swallow with no s/s aspiration. ST checked oral cavity, oral cavity cleared. PO trials discontinued as patient closing eyes and becoming sleepy. Recommend that patient continue Dysphagia1/thin liquids, only eating/drinking when alert and sitting upright. Avoid straws. Small bites/sips. Check oral cavity for oral clearance. ST to follow acutely.   HPI HPI: Mr [name]. [place], 68y/m, presented to ED with incoherent speech, reportedly weaker on the right side and unable to sit or stand. PMH of schizophrenia, Parkinson's disease, intellectual disability, dementia and h/o DVT. At baseline pt lives in SNF, mostly verbal, ambulates w/o assistance, able to feed himself. No known h/o dysphagia. CT head positive for subdural hematoma with right midline shift.  Neurosurgery was consulted and felt that this was a subacute or chronic change.      SLP Plan  Continue with current plan of care       Recommendations   Diet recommendations: Dysphagia 1 (puree);Thin liquid Liquids provided via: No straw Medication Administration: Crushed with puree Supervision: Full supervision/cueing for compensatory strategies;Trained caregiver to feed patient;Staff to assist with self feeding Compensations: Slow rate;Small sips/bites;Monitor for anterior loss;Follow solids with liquid Postural Changes and/or Swallow Maneuvers: Seated upright 90 degrees;Upright 30-60 min after meal                Oral Care Recommendations: Oral care BID;Staff/trained caregiver to provide oral care Follow up Recommendations: Skilled Nursing facility;24 hour supervision/assistance SLP Visit Diagnosis: Dysphagia, oropharyngeal phase (R13.12) Plan: Continue with current plan of care       [place], M.Ed., [place] Speech [facility] Acute Rehabilitation [phone]: Acute Rehab office [phone] - pager   Jenee Spaugh 03/18/2019, 11:40 AM

## 2019-03-18 NOTE — Discharge Summary (Signed)
Physician Discharge Summary  [name] LZJ:[id number] DOB: 02-18-1951 DOA: 03/08/2019  PCP: Rosita Fire, MD  Admit date: 03/08/2019 Discharge date: 03/18/2019  Time spent: 55 minutes  Recommendations for Outpatient Follow-up:  1. Patient will be discharged to skilled nursing facility.  Follow-up with MD at skilled nursing facility.  Patient will need palliative care follow-up at facility.  Patient will need a basic metabolic profile done in 1 week as well as a CBC done.   Discharge Diagnoses:  Active Problems:   Hypotension   Encephalopathy   Pressure injury of skin   Subdural hematoma (HCC)   Palliative care by specialist   Goals of care, counseling/discussion   Delirium   Muscular weakness   Failure to thrive in adult   Full code status   Xerostomia   Spiritual or religious counseling   Anemia   Community acquired pneumonia   Discharge Condition: Stable.  Diet recommendation: Dysphagia 1 diet with thin liquids.  Filed Weights   03/13/19 2134 03/15/19 2005 03/16/19 2108  Weight: 72.5 kg 73.3 kg 73.3 kg    History of present illness:  HPI per Dr. Loanne Drilling 69 year old male presented to ED 03/08/2019 from SNF with altered mental status, hypothermia, and hypotension. Reportedly patient was at baseline mentation last night, this morning he was noted to have garbled dysarthric speech. On arrival to ED BP 86/40. Temp 90. Hemoglobin 5.5. CXR with opacity to right upper lobe. Given Cefepime/Azithromycin/Vancomycin. COVID negative. Transfused 2 units RBC. Due to continued hypotension placed on levophed. CT Head with SDH 9 mm with mild mass effect of left cerebral hemisphere. Neurosurgery Consulted. Reviewed imaging. Believes its subacute. Recommended continuing to hold anticoagulation. Critical Care consulted for admission.   Hospital Course:  1 septic shock secondary to suspected right-sided pneumonia, POA Patient had presented in septic shock admitted to the critical  care service and had to be placed on pressors of Levophed.  MRSA swab was negative.  Influenza PCR and Covid 19 PCR negative.  Urine strep pneumococcus antigen was negative.  Blood cultures with no growth to date.  Chest x-ray which was done showed subtle asymmetric opacity in the right upper lobe new from previous exam.  Vancomycin was subsequently discontinued.    Patient also maintained on IV cefepime and received a full day 7 course of treatment.  Patient improved clinically remained afebrile and will be discharged in stable improved condition.   2.  Anemia of chronic disease/folate deficiency On admission patient noted to have a hemoglobin of 5.5.  Patient with no overt bleeding noted.  Status post 2 units packed red blood cells.  Anemia panel consistent with anemia of chronic disease as well as folate deficiency.  Hemoglobin stabilized at 8.3.  Patient started on folic acid 1 mg daily.  Outpatient follow-up.    3.  Acute metabolic encephalopathy Patient likely close to baseline.  Patient noted to be mostly nonverbal with some incoherent mumbling/grunting.  It is noted that patient able to feed himself and ambulate.  Patient noted to have been residing in a group home prior to admission.  Patient seen by speech therapy and patient placed on a dysphagia 1 diet with thin liquids.  Will need further assessment by speech therapy at SNF. Follow.  4.  Chronic kidney disease stage IIIa Stable throughout the hospitalization.  5.  Left chronic subdural hematoma Hematoma noted on head CT from 03/07/2018 that showed interval development of a thin intermediate density overlying the left cerebral convexity measuring 9  mm in thickness.  Mild mass-effect upon underlying left cerebral hemisphere with a 2 mm rightward midline shift.  Repeat head CT done 03/09/2019 with 9 mm extra-axial fluid collection left is greater compared with prior head CT now measuring up to 9 mm findings compatible with chronic subdural  hematoma.  Slight increase in midline shift to the right now 3 mm.  Prior MD, Dr. Maylene Roes, per her note stated that neurosurgery recommended nonsurgical management and discontinuation of anticoagulation.  Coumadin has subsequently been discontinued and will not be resumed on discharge.  Patient remained stable.  Patient be discharged to skilled nursing facility.  6.  History of right lower extremity DVT September 2020 Felt to be provoked in the setting of immobilization and treatment for cellulitis from August 2020.  Patient presented with a subdural hematoma and as such anticoagulation has been discontinued.  Anticoagulation will not be resumed on discharge.  Outpatient follow-up.  7.  Parkinson's disease/history of schizophrenia Patient maintained on home regimen of amantadine, BuSpar, Sinemet, Klonopin, Clozaril, rasagiline.   Outpatient follow-up.  8.  Hyperlipidemia Patient was maintained on a statin.    9.  Mid stage II pressure injury anus 03/09/2019, POA Wound care during the hospitalization.   Procedures:  CT on 03/09/2019  CT head CT C-spine 03/08/2019  Chest x-ray 03/08/2019  Transfusion of 2 units packed red blood cells  Consultations:  PCCM admission  Palliative care--Michelle Ferolito, NP 03/12/2019   Discharge Exam: Vitals:   03/18/19 0437 03/18/19 0953  BP: 130/76 102/72  Pulse: 77 65  Resp: 18 18  Temp: 97.8 F (36.6 C) 97.9 F (36.6 C)  SpO2: 98% 92%    General: NAD.  Cardiovascular: RRR Respiratory: Some coarse breath sounds anterior lung fields.  Discharge Instructions   Discharge Instructions    Diet general   Complete by: As directed    Dysphagia 1 diet with thin liquids and aspiration precautions.   Increase activity slowly   Complete by: As directed      Allergies as of 03/18/2019      Reactions   Penicillins    .Did it involve swelling of the face/tongue/throat, SOB, or low BP? Unknown Did it involve sudden or severe rash/hives, skin  peeling, or any reaction on the inside of your mouth or nose? Unknown Did you need to seek medical attention at a hospital or doctor's office? Unknown When did it last happen?Unknown If all above answers are "NO", may proceed with cephalosporin use.      Medication List    STOP taking these medications   aspirin EC 81 MG tablet   potassium chloride SA 20 MEQ tablet Commonly known as: KLOR-CON   torsemide 20 MG tablet Commonly known as: DEMADEX   warfarin 2 MG tablet Commonly known as: COUMADIN     TAKE these medications   acetaminophen 500 MG tablet Commonly known as: Q-PAP Take 1 tablet (500 mg total) by mouth every 6 (six) hours as needed for mild pain or fever.   amantadine 100 MG capsule Commonly known as: SYMMETREL Take 100 mg by mouth 2 (two) times daily.   busPIRone 15 MG tablet Commonly known as: BUSPAR Take 1 tablet (15 mg total) by mouth 2 (two) times daily.   carbidopa-levodopa 25-100 MG tablet Commonly known as: SINEMET IR Take 2 tablets by mouth 4 (four) times daily.   clonazePAM 0.5 MG tablet Commonly known as: KLONOPIN Take 0.5 tablets (0.25 mg total) by mouth daily as needed for anxiety.  clozapine 50 MG tablet Commonly known as: CLOZARIL Take 1 tablet (50 mg total) by mouth at bedtime.   Ensure Take 237 mLs by mouth 2 (two) times daily.   folic acid 1 MG tablet Commonly known as: FOLVITE Take 1 tablet (1 mg total) by mouth daily. Start taking on: March 19, 2019   magnesium oxide 400 MG tablet Commonly known as: MAG-OX Take 1 tablet (400 mg total) by mouth 2 (two) times daily.   nystatin cream Commonly known as: MYCOSTATIN Apply 1 application topically 2 (two) times daily. Applied to feet   polyethylene glycol powder 17 GM/SCOOP powder Commonly known as: GaviLAX Take 17 g by mouth 2 (two) times daily.   psyllium 0.52 g capsule Commonly known as: REGULOID Take 1.04 g by mouth every morning.   rasagiline 1 MG Tabs  tablet Commonly known as: AZILECT Take 1 tablet (1 mg total) by mouth daily.   simvastatin 20 MG tablet Commonly known as: ZOCOR Take 20 mg by mouth daily.      Allergies  Allergen Reactions  . Penicillins     .Did it involve swelling of the face/tongue/throat, SOB, or low BP? Unknown Did it involve sudden or severe rash/hives, skin peeling, or any reaction on the inside of your mouth or nose? Unknown Did you need to seek medical attention at a hospital or doctor's office? Unknown When did it last happen?Unknown If all above answers are "NO", may proceed with cephalosporin use.    Follow-up Information    MD AT SNF Follow up.            The results of significant diagnostics from this hospitalization (including imaging, microbiology, ancillary and laboratory) are listed below for reference.    Significant Diagnostic Studies: CT HEAD WO CONTRAST  Result Date: 03/09/2019 CLINICAL DATA:  Subdural hematoma EXAM: CT HEAD WITHOUT CONTRAST TECHNIQUE: Contiguous axial images were obtained from the base of the skull through the vertex without intravenous contrast. COMPARISON:  CT head 03/08/2019 FINDINGS: Brain: Low-density extra-axial fluid collection left appears slightly larger today now measuring up to 9 mm in thickness. This appears to be a chronic subdural hematoma and is slightly more dense than ventricles. No high-density hemorrhage. 3 mm midline shift to the right slightly more prominent. Ventricle size normal. Patchy white matter hypodensity bilaterally appears chronic. No acute infarct. Vascular: Negative for hyperdense vessel Skull: Negative Sinuses/Orbits: Negative Other: None IMPRESSION: 9 mm extra-axial fluid collection left is larger compared with yesterday now measuring up to 9 mm. Findings compatible with chronic subdural hematoma. Slight increase in midline shift to the right now 3 mm. Electronically Signed   By: Franchot Gallo M.D.   On: 03/09/2019 16:29   CT Head  Wo Contrast  Result Date: 03/08/2019 CLINICAL DATA:  Altered mental status (AMS), unclear cause. Additional history provided: Patient unable to stand or supportive self this morning, weaker on the right side. EXAM: CT HEAD WITHOUT CONTRAST TECHNIQUE: Contiguous axial images were obtained from the base of the skull through the vertex without intravenous contrast. COMPARISON:  Head CT 02/10/2019 FINDINGS: Brain: There has been interval development of a thin intermediate density subdural hematoma overlying the left cerebral convexity since CT 02/10/2019. This measures up to 9 mm in thickness. Mild mass effect upon the underlying left cerebral hemisphere. 2 mm rightward midline shift at the level of the septum pellucidum. No evidence of intracranial mass. No demarcated cortical infarct. Ill-defined hypoattenuation within the cerebral white matter is nonspecific, but consistent with chronic  small vessel ischemic disease. Mild generalized parenchymal atrophy. Vascular: No hyperdense vessel Skull: Normal. Negative for fracture or focal lesion. Sinuses/Orbits: Visualized orbits demonstrate no acute abnormality. Mild ethmoid sinus mucosal thickening. No significant mastoid effusion. These results were called by telephone at the time of interpretation on 03/08/2019 at 2:12 pm to provider Dr. Lacinda Axon, who verbally acknowledged these results. IMPRESSION: Interval development of a thin intermediate density subdural hematoma overlying the left cerebral convexity measuring 9 mm in thickness. Mild mass effect upon the underlying left cerebral hemisphere. 2 mm rightward midline shift. Stable generalized parenchymal atrophy and chronic small vessel ischemic disease. Electronically Signed   By: Kellie Simmering DO   On: 03/08/2019 14:12   CT Cervical Spine Wo Contrast  Result Date: 03/08/2019 CLINICAL DATA:  Status post trauma. EXAM: CT CERVICAL SPINE WITHOUT CONTRAST TECHNIQUE: Multidetector CT imaging of the cervical spine was  performed without intravenous contrast. Multiplanar CT image reconstructions were also generated. COMPARISON:  None. FINDINGS: Alignment: Minimal grade 1 retrolisthesis of C4-5 is noted secondary to severe degenerative disc disease. Skull base and vertebrae: No acute fracture. No primary bone lesion or focal pathologic process. Soft tissues and spinal canal: No prevertebral fluid or swelling. No visible canal hematoma. Disc levels: Severe degenerative disc disease is noted at C3-4, C4-5, C5-6 and C6-7. Upper chest: Negative. Other: None. IMPRESSION: Severe multilevel degenerative disc disease. No acute abnormality seen in the cervical spine. Electronically Signed   By: Marijo Conception M.D.   On: 03/08/2019 15:53   DG Chest Portable 1 View  Result Date: 03/08/2019 CLINICAL DATA:  Altered mental status EXAM: PORTABLE CHEST 1 VIEW COMPARISON:  02/11/2019 FINDINGS: The heart size and mediastinal contours are within normal limits. Subtle asymmetric opacity within the right upper lobe is new from previous exam. No pleural effusion or edema identified the visualized skeletal structures are unremarkable. IMPRESSION: 1. Subtle asymmetric opacity in the right upper lobe is new from previous exam. Cannot rule out early pneumonia. Followup PA and lateral chest X-ray is recommended in 3-4 weeks following trial of antibiotic therapy to ensure resolution and exclude underlying malignancy. 2. No heart failure. Electronically Signed   By: Kerby Moors M.D.   On: 03/08/2019 11:52    Microbiology: Recent Results (from the past 240 hour(s))  MRSA PCR Screening     Status: None   Collection Time: 03/08/19  2:35 PM   Specimen: Nasal Mucosa; Nasopharyngeal  Result Value Ref Range Status   MRSA by PCR NEGATIVE NEGATIVE Final    Comment:        The GeneXpert MRSA Assay (FDA approved for NASAL specimens only), is one component of a comprehensive MRSA colonization surveillance program. It is not intended to diagnose  MRSA infection nor to guide or monitor treatment for MRSA infections. Performed at [facility], [address], [place],  [address]   Respiratory Panel by RT PCR (Flu A&B, Covid) - Nasopharyngeal Swab     Status: None   Collection Time: 03/08/19  2:55 PM   Specimen: Nasopharyngeal Swab  Result Value Ref Range Status   SARS Coronavirus 2 by RT PCR NEGATIVE NEGATIVE Final    Comment: (NOTE) SARS-CoV-2 target nucleic acids are NOT DETECTED. The SARS-CoV-2 RNA is generally detectable in upper respiratoy specimens during the acute phase of infection. The lowest concentration of SARS-CoV-2 viral copies this assay can detect is 131 copies/mL. A negative result does not preclude SARS-Cov-2 infection and should not be used as the sole basis for treatment  or other patient management decisions. A negative result may occur with  improper specimen collection/handling, submission of specimen other than nasopharyngeal swab, presence of viral mutation(s) within the areas targeted by this assay, and inadequate number of viral copies (<131 copies/mL). A negative result must be combined with clinical observations, patient history, and epidemiological information. The expected result is Negative. Fact Sheet for Patients:  PinkCheek.be Fact Sheet for Healthcare Providers:  GravelBags.it This test is not yet ap proved or cleared by the [place] FDA and  has been authorized for detection and/or diagnosis of SARS-CoV-2 by FDA under an Emergency Use Authorization (EUA). This EUA will remain  in effect (meaning this test can be used) for the duration of the COVID-19 declaration under Section 564(b)(1) of the Act, 21 U.S.C. section 360bbb-3(b)(1), unless the authorization is terminated or revoked sooner.    Influenza A by PCR NEGATIVE NEGATIVE Final   Influenza B by PCR NEGATIVE NEGATIVE Final    Comment: (NOTE) The Xpert Xpress  SARS-CoV-2/FLU/RSV assay is intended as an aid in  the diagnosis of influenza from Nasopharyngeal swab specimens and  should not be used as a sole basis for treatment. Nasal washings and  aspirates are unacceptable for Xpert Xpress SARS-CoV-2/FLU/RSV  testing. Fact Sheet for Patients: PinkCheek.be Fact Sheet for Healthcare Providers: GravelBags.it This test is not yet approved or cleared by the [place] FDA and  has been authorized for detection and/or diagnosis of SARS-CoV-2 by  FDA under an Emergency Use Authorization (EUA). This EUA will remain  in effect (meaning this test can be used) for the duration of the  Covid-19 declaration under Section 564(b)(1) of the Act, 21  U.S.C. section 360bbb-3(b)(1), unless the authorization is  terminated or revoked. Performed at [facility], [address] [place], [place] [address]   MRSA PCR Screening     Status: None   Collection Time: 03/09/19  3:04 AM   Specimen: Nasopharyngeal  Result Value Ref Range Status   MRSA by PCR NEGATIVE NEGATIVE Final    Comment:        The GeneXpert MRSA Assay (FDA approved for NASAL specimens only), is one component of a comprehensive MRSA colonization surveillance program. It is not intended to diagnose MRSA infection nor to guide or monitor treatment for MRSA infections. Performed at [facility], [place] [address], [place], [place] [address]   SARS CORONAVIRUS 2 (TAT 6-24 HRS) Nasopharyngeal Nasopharyngeal Swab     Status: None   Collection Time: 03/17/19  3:25 PM   Specimen: Nasopharyngeal Swab  Result Value Ref Range Status   SARS Coronavirus 2 NEGATIVE NEGATIVE Final    Comment: (NOTE) SARS-CoV-2 target nucleic acids are NOT DETECTED. The SARS-CoV-2 RNA is generally detectable in upper and lower respiratory specimens during the acute phase of infection. Negative results do not preclude SARS-CoV-2 infection, do not rule  out co-infections with other pathogens, and should not be used as the sole basis for treatment or other patient management decisions. Negative results must be combined with clinical observations, patient history, and epidemiological information. The expected result is Negative. Fact Sheet for Patients: SugarRoll.be Fact Sheet for Healthcare Providers: https://www.woods-mathews.com/ This test is not yet approved or cleared by the [place] FDA and  has been authorized for detection and/or diagnosis of SARS-CoV-2 by FDA under an Emergency Use Authorization (EUA). This EUA will remain  in effect (meaning this test can be used) for the duration of the COVID-19 declaration under Section 56 4(b)(1) of the  Act, 21 U.S.C. section 360bbb-3(b)(1), unless the authorization is terminated or revoked sooner. Performed at [facility], [place] [address] [place], [place] [address]      Labs: Basic Metabolic Panel: Recent Labs  Lab 03/12/19 0420 03/16/19 0304 03/17/19 0308  NA 144 143 146*  K 3.7 3.8 4.0  CL 111 112* 112*  CO2 27 26 27   GLUCOSE 78 83 90  BUN 13 16 17   CREATININE 1.38* 1.07 0.90  CALCIUM 8.0* 8.3* 8.5*   Liver Function Tests: No results for input(s): AST, ALT, ALKPHOS, BILITOT, PROT, ALBUMIN in the last 168 hours. No results for input(s): LIPASE, AMYLASE in the last 168 hours. No results for input(s): AMMONIA in the last 168 hours. CBC: Recent Labs  Lab 03/12/19 0420 03/13/19 0429 03/16/19 0304 03/17/19 0308  WBC 9.2 10.6* 11.9* 12.3*  HGB 7.4* 7.9* 8.2* 8.3*  HCT 25.2* 27.3* 28.5* 28.5*  MCV 90.0 91.3 91.1 89.6  PLT 251 282 285 303   Cardiac Enzymes: No results for input(s): CKTOTAL, CKMB, CKMBINDEX, TROPONINI in the last 168 hours. BNP: BNP (last 3 results) Recent Labs    10/13/18 1118 11/22/18 1427  BNP 63.0 14.0    ProBNP (last 3 results) No results for input(s): PROBNP in the last 8760  hours.  CBG: Recent Labs  Lab 03/12/19 2118 03/13/19 0703 03/13/19 1132 03/13/19 2133 03/14/19 0657  GLUCAP 83 70 155* 112* 78       Signed:  Irine Seal MD.  [facility] 03/18/2019, 2:19 PM

## 2019-03-18 NOTE — Plan of Care (Signed)
  Problem: Nutrition: Goal: Adequate nutrition will be maintained Outcome: Progressing   

## 2019-03-18 NOTE — Plan of Care (Signed)
  Problem: Education: Goal: Knowledge of General Education information will improve Description: Including pain rating scale, medication(s)/side effects and non-pharmacologic comfort measures Outcome: Progressing   Problem: Safety: Goal: Ability to remain free from injury will improve Outcome: Progressing   

## 2019-03-18 NOTE — Progress Notes (Signed)
Occupational Therapy Treatment Patient Details Name: [name] MRN: [id number] DOB: 1950/07/05 Today's Date: 03/18/2019    History of present illness 69 year old male with past medical history significant for schizophrenia, Parkinson's disease, intellectual disability, dementia, history of DVT on warfarin who presented from skilled nursing facility due to altered mental status. Pt found to have sub-acute vs chronic L SDH, along with septic shock 2/2 R PNA.   OT comments  Pt. Seen for skilled OT treatment session.  Eyes closed for most of session but did not appear to be fully asleep as he was participating intermittently and did respond to some of the verbal and tactile cues provided.  Grooming task performed max/total assist, And continued with HEP to focus on B UE rom and strengthening.    Follow Up Recommendations       Equipment Recommendations       Recommendations for Other Services      Precautions / Restrictions Precautions Precautions: Fall       Mobility Bed Mobility Overal bed mobility: Needs Assistance             General bed mobility comments: total assist for repositioning utilizing bed controls for assistance in pulling pt. up in bed  Transfers                      Balance                                           ADL either performed or assessed with clinical judgement   ADL Overall ADL's : Needs assistance/impaired     Grooming: Wash/dry face;Maximal assistance;Bed level Grooming Details (indicate cue type and reason): required verbal cues to engage, bed level today                               General ADL Comments: session bed level today. pt. with limited participation. eyes closed but not asleep as he would respond intermittently     Vision       Perception     Praxis      Cognition Arousal/Alertness: Lethargic                                               Exercises General Exercises - Upper Extremity Shoulder Flexion: AAROM;Both;Supine;5 reps Shoulder Extension: AAROM;Both;5 reps;Supine Elbow Flexion: AAROM;Both;5 reps;Supine Elbow Extension: AAROM;Both;5 reps;Supine Digit Composite Flexion: AAROM;Both;5 reps;Supine Composite Extension: AAROM;Both;5 reps;Supine   Shoulder Instructions       General Comments      Pertinent Vitals/ Pain       Pain Assessment: Faces Faces Pain Scale: Hurts even more Pain Location: moaning and said "ow" when bed mobility was being performed so specific location not identified Pain Intervention(s): Limited activity within patient's tolerance;Monitored during session;Repositioned  Home Living                                          Prior Functioning/Environment              Frequency  Min 2X/week  Progress Toward Goals  OT Goals(current goals can now be found in the care plan section)        Plan Discharge plan remains appropriate;Frequency remains appropriate    Co-evaluation                 AM-PAC OT "6 Clicks" Daily Activity     Outcome Measure   Help from another person eating meals?: A Lot Help from another person taking care of personal grooming?: A Lot Help from another person toileting, which includes using toliet, bedpan, or urinal?: Total Help from another person bathing (including washing, rinsing, drying)?: Total Help from another person to put on and taking off regular upper body clothing?: Total Help from another person to put on and taking off regular lower body clothing?: Total 6 Click Score: 8    End of Session    OT Visit Diagnosis: Other abnormalities of gait and mobility (R26.89);Cognitive communication deficit (R41.841);Other symptoms and signs involving cognitive function;Muscle weakness (generalized) (M62.81);Other symptoms and signs involving the nervous system (R29.898) Symptoms and signs involving cognitive functions:  Other cerebrovascular disease   Activity Tolerance Patient tolerated treatment well   Patient Left in bed;with call bell/phone within reach;with bed alarm set   Nurse Communication          Time: 0812-0822 OT Time Calculation (min): 10 min  Charges: OT General Charges $OT Visit: 1 Visit OT Treatments $Therapeutic Activity: 8-22 mins  Sonia Baller, COTA/L Acute Rehabilitation [phone]   Janice Coffin 03/18/2019, 8:52 AM

## 2019-03-18 NOTE — TOC Transition Note (Signed)
Transition of Care [facility]) - CM/SW Discharge Note 03/18/19 - Discharged to [facility] via [place]   Patient Details  Name: [name] MRN: [id number] Date of Birth: [birth year]  Transition of Care [facility]) CM/SW Contact:  Sable Feil, LCSW Phone Number: 03/18/2019, 5:09 PM   Clinical Narrative:  Patient medically stable for discharge and transitioning to [facility] in [facility]. Facility advised of today's discharge and d/c clinicals transmitted to facility. Patient's brother [name] contacted [phone]) and informed about discharge.      Final next level of care: Skilled Nursing Facility Barriers to Discharge: Barriers Resolved   Patient Goals and CMS Choice Patient states their goals for this hospitalization and ongoing recovery are:: [name], patient's brother wants him to get stronger so that he can return to the family care hom CMS Medicare.gov Compare Post Acute Care list provided to:: Patient Represenative (must comment)(Brother informed re: [email or web address]) Choice offered to / list presented to : Sibling  Discharge Placement PASRR number recieved: (Submitted for PASRR and it remains pending. Facility accepting patient wtih pending Level II PASRR as requested clinicals submitted)            [facility] Patient to be transferred to facility by: Non-emergency ambulance transport Name of family member notified: Brother [name] - [phone] Patient and family notified of of transfer: 03/18/19  Discharge Plan and Services In-house Referral: Clinical Social Work                                  Social Determinants of Health (SDOH) Interventions  No SDOH interventions needed or requested prior to discharge.   Readmission Risk Interventions Readmission Risk Prevention Plan 02/11/2019  Transportation Screening Complete  Medication  Review (RN Care Manager) Complete  Palliative Care Screening Not Applicable  Some recent data might be hidden

## 2019-03-21 DIAGNOSIS — G20 Parkinson's disease: Secondary | ICD-10-CM | POA: Diagnosis not present

## 2019-03-21 DIAGNOSIS — N189 Chronic kidney disease, unspecified: Secondary | ICD-10-CM | POA: Diagnosis not present

## 2019-03-21 DIAGNOSIS — J189 Pneumonia, unspecified organism: Secondary | ICD-10-CM | POA: Diagnosis not present

## 2019-03-21 DIAGNOSIS — I6203 Nontraumatic chronic subdural hemorrhage: Secondary | ICD-10-CM | POA: Diagnosis not present

## 2019-03-22 DIAGNOSIS — W19XXXA Unspecified fall, initial encounter: Secondary | ICD-10-CM | POA: Diagnosis not present

## 2019-03-22 DIAGNOSIS — L89150 Pressure ulcer of sacral region, unstageable: Secondary | ICD-10-CM | POA: Diagnosis not present

## 2019-03-22 DIAGNOSIS — G9340 Encephalopathy, unspecified: Secondary | ICD-10-CM | POA: Diagnosis not present

## 2019-03-25 DIAGNOSIS — L89150 Pressure ulcer of sacral region, unstageable: Secondary | ICD-10-CM | POA: Diagnosis not present

## 2019-03-25 DIAGNOSIS — L97819 Non-pressure chronic ulcer of other part of right lower leg with unspecified severity: Secondary | ICD-10-CM | POA: Diagnosis not present

## 2019-03-28 DIAGNOSIS — J189 Pneumonia, unspecified organism: Secondary | ICD-10-CM | POA: Diagnosis not present

## 2019-03-28 DIAGNOSIS — I6203 Nontraumatic chronic subdural hemorrhage: Secondary | ICD-10-CM | POA: Diagnosis not present

## 2019-03-28 DIAGNOSIS — G9340 Encephalopathy, unspecified: Secondary | ICD-10-CM | POA: Diagnosis not present

## 2019-03-28 DIAGNOSIS — G20 Parkinson's disease: Secondary | ICD-10-CM | POA: Diagnosis not present

## 2019-03-29 DIAGNOSIS — L97819 Non-pressure chronic ulcer of other part of right lower leg with unspecified severity: Secondary | ICD-10-CM | POA: Diagnosis not present

## 2019-03-29 DIAGNOSIS — L89154 Pressure ulcer of sacral region, stage 4: Secondary | ICD-10-CM | POA: Diagnosis not present

## 2019-03-29 DIAGNOSIS — F209 Schizophrenia, unspecified: Secondary | ICD-10-CM | POA: Diagnosis not present

## 2019-03-30 DIAGNOSIS — W19XXXA Unspecified fall, initial encounter: Secondary | ICD-10-CM | POA: Diagnosis not present

## 2019-03-30 DIAGNOSIS — R569 Unspecified convulsions: Secondary | ICD-10-CM | POA: Diagnosis not present

## 2019-03-31 DIAGNOSIS — G9340 Encephalopathy, unspecified: Secondary | ICD-10-CM | POA: Diagnosis not present

## 2019-03-31 DIAGNOSIS — W19XXXA Unspecified fall, initial encounter: Secondary | ICD-10-CM | POA: Diagnosis not present

## 2019-04-01 DIAGNOSIS — W19XXXA Unspecified fall, initial encounter: Secondary | ICD-10-CM | POA: Diagnosis not present

## 2019-04-01 DIAGNOSIS — G9340 Encephalopathy, unspecified: Secondary | ICD-10-CM | POA: Diagnosis not present

## 2019-04-01 DIAGNOSIS — R569 Unspecified convulsions: Secondary | ICD-10-CM | POA: Diagnosis not present

## 2019-04-04 DIAGNOSIS — W06XXXA Fall from bed, initial encounter: Secondary | ICD-10-CM | POA: Diagnosis not present

## 2019-04-04 DIAGNOSIS — S0990XA Unspecified injury of head, initial encounter: Secondary | ICD-10-CM | POA: Diagnosis not present

## 2019-04-04 DIAGNOSIS — Y998 Other external cause status: Secondary | ICD-10-CM | POA: Diagnosis not present

## 2019-04-04 DIAGNOSIS — S0181XA Laceration without foreign body of other part of head, initial encounter: Secondary | ICD-10-CM | POA: Diagnosis not present

## 2019-04-04 DIAGNOSIS — S0003XA Contusion of scalp, initial encounter: Secondary | ICD-10-CM | POA: Diagnosis not present

## 2019-04-04 DIAGNOSIS — R918 Other nonspecific abnormal finding of lung field: Secondary | ICD-10-CM | POA: Diagnosis not present

## 2019-04-04 DIAGNOSIS — M47812 Spondylosis without myelopathy or radiculopathy, cervical region: Secondary | ICD-10-CM | POA: Diagnosis not present

## 2019-04-05 DIAGNOSIS — G9340 Encephalopathy, unspecified: Secondary | ICD-10-CM | POA: Diagnosis not present

## 2019-04-05 DIAGNOSIS — I6203 Nontraumatic chronic subdural hemorrhage: Secondary | ICD-10-CM | POA: Diagnosis not present

## 2019-04-05 DIAGNOSIS — R296 Repeated falls: Secondary | ICD-10-CM | POA: Diagnosis not present

## 2019-04-05 DIAGNOSIS — J189 Pneumonia, unspecified organism: Secondary | ICD-10-CM | POA: Diagnosis not present

## 2019-04-05 DIAGNOSIS — L97819 Non-pressure chronic ulcer of other part of right lower leg with unspecified severity: Secondary | ICD-10-CM | POA: Diagnosis not present

## 2019-04-05 DIAGNOSIS — L89154 Pressure ulcer of sacral region, stage 4: Secondary | ICD-10-CM | POA: Diagnosis not present

## 2019-04-10 DIAGNOSIS — S0990XA Unspecified injury of head, initial encounter: Secondary | ICD-10-CM | POA: Diagnosis not present

## 2019-04-10 DIAGNOSIS — W050XXA Fall from non-moving wheelchair, initial encounter: Secondary | ICD-10-CM | POA: Diagnosis not present

## 2019-04-10 DIAGNOSIS — Z23 Encounter for immunization: Secondary | ICD-10-CM | POA: Diagnosis not present

## 2019-04-10 DIAGNOSIS — Z20822 Contact with and (suspected) exposure to covid-19: Secondary | ICD-10-CM | POA: Diagnosis not present

## 2019-04-10 DIAGNOSIS — F0390 Unspecified dementia without behavioral disturbance: Secondary | ICD-10-CM | POA: Diagnosis not present

## 2019-04-10 DIAGNOSIS — R918 Other nonspecific abnormal finding of lung field: Secondary | ICD-10-CM | POA: Diagnosis not present

## 2019-04-10 DIAGNOSIS — S199XXA Unspecified injury of neck, initial encounter: Secondary | ICD-10-CM | POA: Diagnosis not present

## 2019-04-10 DIAGNOSIS — S2231XA Fracture of one rib, right side, initial encounter for closed fracture: Secondary | ICD-10-CM | POA: Diagnosis not present

## 2019-04-10 DIAGNOSIS — S01112A Laceration without foreign body of left eyelid and periocular area, initial encounter: Secondary | ICD-10-CM | POA: Diagnosis not present

## 2019-04-10 DIAGNOSIS — R9389 Abnormal findings on diagnostic imaging of other specified body structures: Secondary | ICD-10-CM | POA: Diagnosis not present

## 2019-04-10 DIAGNOSIS — R410 Disorientation, unspecified: Secondary | ICD-10-CM | POA: Diagnosis not present

## 2019-04-10 DIAGNOSIS — Y998 Other external cause status: Secondary | ICD-10-CM | POA: Diagnosis not present

## 2019-04-11 DIAGNOSIS — F419 Anxiety disorder, unspecified: Secondary | ICD-10-CM | POA: Diagnosis not present

## 2019-04-11 DIAGNOSIS — Z515 Encounter for palliative care: Secondary | ICD-10-CM | POA: Diagnosis not present

## 2019-04-11 DIAGNOSIS — I10 Essential (primary) hypertension: Secondary | ICD-10-CM | POA: Diagnosis not present

## 2019-04-11 DIAGNOSIS — S2239XA Fracture of one rib, unspecified side, initial encounter for closed fracture: Secondary | ICD-10-CM | POA: Diagnosis not present

## 2019-04-11 DIAGNOSIS — R296 Repeated falls: Secondary | ICD-10-CM | POA: Diagnosis not present

## 2019-04-11 DIAGNOSIS — R000 Tachycardia, unspecified: Secondary | ICD-10-CM | POA: Diagnosis not present

## 2019-04-11 DIAGNOSIS — G20 Parkinson's disease: Secondary | ICD-10-CM | POA: Diagnosis not present

## 2019-04-11 DIAGNOSIS — E440 Moderate protein-calorie malnutrition: Secondary | ICD-10-CM | POA: Diagnosis not present

## 2019-04-11 DIAGNOSIS — J45909 Unspecified asthma, uncomplicated: Secondary | ICD-10-CM | POA: Diagnosis not present

## 2019-04-12 ENCOUNTER — Other Ambulatory Visit: Payer: Self-pay

## 2019-04-12 DIAGNOSIS — G40909 Epilepsy, unspecified, not intractable, without status epilepticus: Secondary | ICD-10-CM | POA: Diagnosis not present

## 2019-04-12 DIAGNOSIS — F209 Schizophrenia, unspecified: Secondary | ICD-10-CM | POA: Diagnosis not present

## 2019-04-12 DIAGNOSIS — R296 Repeated falls: Secondary | ICD-10-CM | POA: Diagnosis not present

## 2019-04-12 DIAGNOSIS — G20 Parkinson's disease: Secondary | ICD-10-CM | POA: Diagnosis not present

## 2019-04-12 DIAGNOSIS — N189 Chronic kidney disease, unspecified: Secondary | ICD-10-CM | POA: Diagnosis not present

## 2019-04-12 DIAGNOSIS — E440 Moderate protein-calorie malnutrition: Secondary | ICD-10-CM | POA: Diagnosis not present

## 2019-04-12 NOTE — Patient Outreach (Signed)
[place] [facility]) Care Management  04/12/2019  [name] [birth year] [id number]   Referral received patient discharges from [facility] on 04-10-19.   Telephone call to patient. Number invalid.  Called to stated PCP Dr. Legrand Rams.  Patient is in active with them as patient is in a facility long term.    Plan: RN CM will close case as patient is in a long term facility per previous PCP office.  Jone Baseman, RN, MSN [place] [facility] Care Management Coordinator Direct Line [phone] Cell [phone] Toll Free: [phone]  Fax: [phone]

## 2019-04-14 MED ORDER — AMLODIPINE BESYLATE 5 MG PO TABS
5.00 | ORAL_TABLET | ORAL | Status: DC
Start: 2019-04-13 — End: 2019-04-14

## 2019-04-14 MED ORDER — ONDANSETRON HCL 4 MG/2ML IJ SOLN
4.00 | INTRAMUSCULAR | Status: DC
Start: ? — End: 2019-04-14

## 2019-04-14 MED ORDER — MELATONIN 3 MG PO TABS
3.00 | ORAL_TABLET | ORAL | Status: DC
Start: ? — End: 2019-04-14

## 2019-04-14 MED ORDER — ENOXAPARIN SODIUM 40 MG/0.4ML ~~LOC~~ SOLN
40.00 | SUBCUTANEOUS | Status: DC
Start: 2019-04-12 — End: 2019-04-14

## 2019-04-14 MED ORDER — CARBIDOPA-LEVODOPA 25-100 MG PO TABS
2.00 | ORAL_TABLET | ORAL | Status: DC
Start: 2019-04-12 — End: 2019-04-14

## 2019-04-14 MED ORDER — MEGESTROL ACETATE 40 MG/ML PO SUSP
400.00 | ORAL | Status: DC
Start: 2019-04-13 — End: 2019-04-14

## 2019-04-14 MED ORDER — ALBUTEROL SULFATE HFA 108 (90 BASE) MCG/ACT IN AERS
2.00 | INHALATION_SPRAY | RESPIRATORY_TRACT | Status: DC
Start: ? — End: 2019-04-14

## 2019-04-14 MED ORDER — BUSPIRONE HCL 15 MG PO TABS
7.50 | ORAL_TABLET | ORAL | Status: DC
Start: 2019-04-12 — End: 2019-04-14

## 2019-04-14 MED ORDER — DEXTROSE-SODIUM CHLORIDE 5-0.9 % IV SOLN
INTRAVENOUS | Status: DC
Start: ? — End: 2019-04-14

## 2019-04-14 MED ORDER — HYDRALAZINE HCL 10 MG PO TABS
10.00 | ORAL_TABLET | ORAL | Status: DC
Start: ? — End: 2019-04-14

## 2019-04-14 MED ORDER — CLONAZEPAM 0.5 MG PO TABS
0.25 | ORAL_TABLET | ORAL | Status: DC
Start: ? — End: 2019-04-14

## 2019-04-14 MED ORDER — MAGNESIUM OXIDE 400 MG PO TABS
400.00 | ORAL_TABLET | ORAL | Status: DC
Start: 2019-04-12 — End: 2019-04-14

## 2019-04-14 MED ORDER — POLYETHYLENE GLYCOL 3350 17 GM/SCOOP PO POWD
17.00 | ORAL | Status: DC
Start: 2019-04-12 — End: 2019-04-14

## 2019-04-14 MED ORDER — LEVETIRACETAM 500 MG PO TABS
500.00 | ORAL_TABLET | ORAL | Status: DC
Start: 2019-04-12 — End: 2019-04-14

## 2019-04-14 MED ORDER — ACETAMINOPHEN 325 MG PO TABS
650.00 | ORAL_TABLET | ORAL | Status: DC
Start: ? — End: 2019-04-14

## 2019-04-14 MED ORDER — AMANTADINE HCL 100 MG PO CAPS
200.00 | ORAL_CAPSULE | ORAL | Status: DC
Start: 2019-04-12 — End: 2019-04-14

## 2019-04-14 MED ORDER — DSS 100 MG PO CAPS
100.00 | ORAL_CAPSULE | ORAL | Status: DC
Start: ? — End: 2019-04-14

## 2019-04-16 DIAGNOSIS — L89153 Pressure ulcer of sacral region, stage 3: Secondary | ICD-10-CM | POA: Diagnosis not present

## 2019-04-16 DIAGNOSIS — T68XXXA Hypothermia, initial encounter: Secondary | ICD-10-CM | POA: Diagnosis not present

## 2019-04-16 DIAGNOSIS — M479 Spondylosis, unspecified: Secondary | ICD-10-CM | POA: Diagnosis not present

## 2019-04-16 DIAGNOSIS — J8410 Pulmonary fibrosis, unspecified: Secondary | ICD-10-CM | POA: Diagnosis not present

## 2019-04-16 DIAGNOSIS — R1310 Dysphagia, unspecified: Secondary | ICD-10-CM | POA: Diagnosis not present

## 2019-04-16 DIAGNOSIS — M7989 Other specified soft tissue disorders: Secondary | ICD-10-CM | POA: Diagnosis not present

## 2019-04-16 DIAGNOSIS — E43 Unspecified severe protein-calorie malnutrition: Secondary | ICD-10-CM | POA: Diagnosis not present

## 2019-04-16 DIAGNOSIS — R296 Repeated falls: Secondary | ICD-10-CM | POA: Diagnosis not present

## 2019-04-16 DIAGNOSIS — N189 Chronic kidney disease, unspecified: Secondary | ICD-10-CM | POA: Diagnosis not present

## 2019-04-16 DIAGNOSIS — G4089 Other seizures: Secondary | ICD-10-CM | POA: Diagnosis not present

## 2019-04-16 DIAGNOSIS — N183 Chronic kidney disease, stage 3 unspecified: Secondary | ICD-10-CM | POA: Diagnosis not present

## 2019-04-16 DIAGNOSIS — R140 Abdominal distension (gaseous): Secondary | ICD-10-CM | POA: Diagnosis not present

## 2019-04-16 DIAGNOSIS — L89154 Pressure ulcer of sacral region, stage 4: Secondary | ICD-10-CM | POA: Diagnosis not present

## 2019-04-16 DIAGNOSIS — F05 Delirium due to known physiological condition: Secondary | ICD-10-CM | POA: Diagnosis not present

## 2019-04-16 DIAGNOSIS — I8290 Acute embolism and thrombosis of unspecified vein: Secondary | ICD-10-CM | POA: Diagnosis not present

## 2019-04-16 DIAGNOSIS — R4182 Altered mental status, unspecified: Secondary | ICD-10-CM | POA: Diagnosis not present

## 2019-04-16 DIAGNOSIS — N179 Acute kidney failure, unspecified: Secondary | ICD-10-CM | POA: Diagnosis not present

## 2019-04-16 DIAGNOSIS — F819 Developmental disorder of scholastic skills, unspecified: Secondary | ICD-10-CM | POA: Diagnosis not present

## 2019-04-16 DIAGNOSIS — K5900 Constipation, unspecified: Secondary | ICD-10-CM | POA: Diagnosis not present

## 2019-04-16 DIAGNOSIS — E639 Nutritional deficiency, unspecified: Secondary | ICD-10-CM | POA: Diagnosis not present

## 2019-04-16 DIAGNOSIS — R2990 Unspecified symptoms and signs involving the nervous system: Secondary | ICD-10-CM | POA: Diagnosis not present

## 2019-04-16 DIAGNOSIS — R64 Cachexia: Secondary | ICD-10-CM | POA: Diagnosis not present

## 2019-04-16 DIAGNOSIS — J9611 Chronic respiratory failure with hypoxia: Secondary | ICD-10-CM | POA: Diagnosis not present

## 2019-04-16 DIAGNOSIS — R5381 Other malaise: Secondary | ICD-10-CM | POA: Diagnosis not present

## 2019-04-16 DIAGNOSIS — M19042 Primary osteoarthritis, left hand: Secondary | ICD-10-CM | POA: Diagnosis not present

## 2019-04-16 DIAGNOSIS — F79 Unspecified intellectual disabilities: Secondary | ICD-10-CM | POA: Diagnosis not present

## 2019-04-16 DIAGNOSIS — F73 Profound intellectual disabilities: Secondary | ICD-10-CM | POA: Diagnosis not present

## 2019-04-16 DIAGNOSIS — W19XXXA Unspecified fall, initial encounter: Secondary | ICD-10-CM | POA: Diagnosis not present

## 2019-04-16 DIAGNOSIS — F72 Severe intellectual disabilities: Secondary | ICD-10-CM | POA: Diagnosis not present

## 2019-04-16 DIAGNOSIS — G20 Parkinson's disease: Secondary | ICD-10-CM | POA: Diagnosis not present

## 2019-04-16 DIAGNOSIS — L98419 Non-pressure chronic ulcer of buttock with unspecified severity: Secondary | ICD-10-CM | POA: Diagnosis not present

## 2019-04-16 DIAGNOSIS — R569 Unspecified convulsions: Secondary | ICD-10-CM | POA: Diagnosis not present

## 2019-04-16 DIAGNOSIS — R633 Feeding difficulties: Secondary | ICD-10-CM | POA: Diagnosis not present

## 2019-04-16 DIAGNOSIS — F09 Unspecified mental disorder due to known physiological condition: Secondary | ICD-10-CM | POA: Diagnosis not present

## 2019-04-16 DIAGNOSIS — R627 Adult failure to thrive: Secondary | ICD-10-CM | POA: Diagnosis not present

## 2019-04-16 DIAGNOSIS — R5383 Other fatigue: Secondary | ICD-10-CM | POA: Diagnosis not present

## 2019-04-16 DIAGNOSIS — L89150 Pressure ulcer of sacral region, unstageable: Secondary | ICD-10-CM | POA: Diagnosis not present

## 2019-04-16 DIAGNOSIS — Z4682 Encounter for fitting and adjustment of non-vascular catheter: Secondary | ICD-10-CM | POA: Diagnosis not present

## 2019-04-16 DIAGNOSIS — G9340 Encephalopathy, unspecified: Secondary | ICD-10-CM | POA: Diagnosis not present

## 2019-04-16 DIAGNOSIS — E162 Hypoglycemia, unspecified: Secondary | ICD-10-CM | POA: Diagnosis not present

## 2019-04-16 DIAGNOSIS — Z9181 History of falling: Secondary | ICD-10-CM | POA: Diagnosis not present

## 2019-04-16 DIAGNOSIS — T68XXXD Hypothermia, subsequent encounter: Secondary | ICD-10-CM | POA: Diagnosis not present

## 2019-04-16 DIAGNOSIS — L98429 Non-pressure chronic ulcer of back with unspecified severity: Secondary | ICD-10-CM | POA: Diagnosis not present

## 2019-04-16 DIAGNOSIS — E039 Hypothyroidism, unspecified: Secondary | ICD-10-CM | POA: Diagnosis not present

## 2019-04-27 DIAGNOSIS — G459 Transient cerebral ischemic attack, unspecified: Secondary | ICD-10-CM | POA: Diagnosis not present

## 2019-04-27 DIAGNOSIS — G20 Parkinson's disease: Secondary | ICD-10-CM | POA: Diagnosis not present

## 2019-04-27 DIAGNOSIS — Z20822 Contact with and (suspected) exposure to covid-19: Secondary | ICD-10-CM | POA: Diagnosis not present

## 2019-04-27 DIAGNOSIS — J8410 Pulmonary fibrosis, unspecified: Secondary | ICD-10-CM | POA: Diagnosis not present

## 2019-04-27 DIAGNOSIS — E43 Unspecified severe protein-calorie malnutrition: Secondary | ICD-10-CM | POA: Diagnosis not present

## 2019-04-27 DIAGNOSIS — E162 Hypoglycemia, unspecified: Secondary | ICD-10-CM | POA: Diagnosis not present

## 2019-04-27 DIAGNOSIS — G4089 Other seizures: Secondary | ICD-10-CM | POA: Diagnosis not present

## 2019-04-27 DIAGNOSIS — T68XXXA Hypothermia, initial encounter: Secondary | ICD-10-CM | POA: Diagnosis not present

## 2019-04-27 DIAGNOSIS — J189 Pneumonia, unspecified organism: Secondary | ICD-10-CM | POA: Diagnosis not present

## 2019-04-27 DIAGNOSIS — F0390 Unspecified dementia without behavioral disturbance: Secondary | ICD-10-CM | POA: Diagnosis not present

## 2019-04-27 DIAGNOSIS — G9340 Encephalopathy, unspecified: Secondary | ICD-10-CM | POA: Diagnosis not present

## 2019-04-27 DIAGNOSIS — G40909 Epilepsy, unspecified, not intractable, without status epilepticus: Secondary | ICD-10-CM | POA: Diagnosis not present

## 2019-04-27 DIAGNOSIS — Z79899 Other long term (current) drug therapy: Secondary | ICD-10-CM | POA: Diagnosis not present

## 2019-04-27 DIAGNOSIS — M479 Spondylosis, unspecified: Secondary | ICD-10-CM | POA: Diagnosis not present

## 2019-04-27 DIAGNOSIS — F39 Unspecified mood [affective] disorder: Secondary | ICD-10-CM | POA: Diagnosis not present

## 2019-04-27 DIAGNOSIS — R4182 Altered mental status, unspecified: Secondary | ICD-10-CM | POA: Diagnosis not present

## 2019-04-27 DIAGNOSIS — F79 Unspecified intellectual disabilities: Secondary | ICD-10-CM | POA: Diagnosis not present

## 2019-04-27 DIAGNOSIS — R0602 Shortness of breath: Secondary | ICD-10-CM | POA: Diagnosis not present

## 2019-04-27 DIAGNOSIS — R001 Bradycardia, unspecified: Secondary | ICD-10-CM | POA: Diagnosis not present

## 2019-04-27 DIAGNOSIS — R030 Elevated blood-pressure reading, without diagnosis of hypertension: Secondary | ICD-10-CM | POA: Diagnosis not present

## 2019-04-27 DIAGNOSIS — G3183 Dementia with Lewy bodies: Secondary | ICD-10-CM | POA: Diagnosis not present

## 2019-04-27 DIAGNOSIS — F05 Delirium due to known physiological condition: Secondary | ICD-10-CM | POA: Diagnosis not present

## 2019-04-27 DIAGNOSIS — E871 Hypo-osmolality and hyponatremia: Secondary | ICD-10-CM | POA: Diagnosis not present

## 2019-04-27 DIAGNOSIS — L89153 Pressure ulcer of sacral region, stage 3: Secondary | ICD-10-CM | POA: Diagnosis not present

## 2019-04-27 DIAGNOSIS — F73 Profound intellectual disabilities: Secondary | ICD-10-CM | POA: Diagnosis not present

## 2019-04-27 DIAGNOSIS — F0280 Dementia in other diseases classified elsewhere without behavioral disturbance: Secondary | ICD-10-CM | POA: Diagnosis not present

## 2019-04-27 DIAGNOSIS — Z681 Body mass index (BMI) 19 or less, adult: Secondary | ICD-10-CM | POA: Diagnosis not present

## 2019-04-27 DIAGNOSIS — N189 Chronic kidney disease, unspecified: Secondary | ICD-10-CM | POA: Diagnosis not present

## 2019-04-27 DIAGNOSIS — R569 Unspecified convulsions: Secondary | ICD-10-CM | POA: Diagnosis not present

## 2019-04-27 DIAGNOSIS — L89150 Pressure ulcer of sacral region, unstageable: Secondary | ICD-10-CM | POA: Diagnosis not present

## 2019-04-28 ENCOUNTER — Other Ambulatory Visit: Payer: Self-pay
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
## 2019-05-02 DIAGNOSIS — G20 Parkinson's disease: Secondary | ICD-10-CM | POA: Diagnosis not present

## 2019-05-02 DIAGNOSIS — F05 Delirium due to known physiological condition: Secondary | ICD-10-CM | POA: Diagnosis not present

## 2019-05-02 DIAGNOSIS — G40909 Epilepsy, unspecified, not intractable, without status epilepticus: Secondary | ICD-10-CM | POA: Diagnosis not present

## 2019-05-02 DIAGNOSIS — F39 Unspecified mood [affective] disorder: Secondary | ICD-10-CM | POA: Diagnosis not present

## 2019-05-02 DIAGNOSIS — E43 Unspecified severe protein-calorie malnutrition: Secondary | ICD-10-CM | POA: Diagnosis not present

## 2019-05-02 DIAGNOSIS — F0390 Unspecified dementia without behavioral disturbance: Secondary | ICD-10-CM | POA: Diagnosis not present

## 2019-05-02 DIAGNOSIS — G3183 Dementia with Lewy bodies: Secondary | ICD-10-CM | POA: Diagnosis not present

## 2019-05-02 DIAGNOSIS — R569 Unspecified convulsions: Secondary | ICD-10-CM | POA: Diagnosis not present

## 2019-05-02 DIAGNOSIS — F0280 Dementia in other diseases classified elsewhere without behavioral disturbance: Secondary | ICD-10-CM | POA: Diagnosis not present

## 2019-05-02 DIAGNOSIS — F79 Unspecified intellectual disabilities: Secondary | ICD-10-CM | POA: Diagnosis not present

## 2019-05-03 DIAGNOSIS — L89150 Pressure ulcer of sacral region, unstageable: Secondary | ICD-10-CM | POA: Diagnosis not present

## 2019-05-05 DIAGNOSIS — R4182 Altered mental status, unspecified: Secondary | ICD-10-CM | POA: Diagnosis not present

## 2019-05-05 DIAGNOSIS — J189 Pneumonia, unspecified organism: Secondary | ICD-10-CM | POA: Diagnosis not present

## 2019-05-05 DIAGNOSIS — R030 Elevated blood-pressure reading, without diagnosis of hypertension: Secondary | ICD-10-CM | POA: Diagnosis not present

## 2019-05-05 DIAGNOSIS — L853 Xerosis cutis: Secondary | ICD-10-CM | POA: Diagnosis not present

## 2019-05-05 DIAGNOSIS — G459 Transient cerebral ischemic attack, unspecified: Secondary | ICD-10-CM | POA: Diagnosis not present

## 2019-05-05 DIAGNOSIS — G20 Parkinson's disease: Secondary | ICD-10-CM | POA: Diagnosis not present

## 2019-05-05 DIAGNOSIS — G40909 Epilepsy, unspecified, not intractable, without status epilepticus: Secondary | ICD-10-CM | POA: Diagnosis not present

## 2019-05-05 DIAGNOSIS — L89153 Pressure ulcer of sacral region, stage 3: Secondary | ICD-10-CM | POA: Diagnosis not present

## 2019-05-05 DIAGNOSIS — J181 Lobar pneumonia, unspecified organism: Secondary | ICD-10-CM | POA: Diagnosis not present

## 2019-05-05 DIAGNOSIS — L819 Disorder of pigmentation, unspecified: Secondary | ICD-10-CM | POA: Diagnosis not present

## 2019-05-05 DIAGNOSIS — Z79899 Other long term (current) drug therapy: Secondary | ICD-10-CM | POA: Diagnosis not present

## 2019-05-05 DIAGNOSIS — Z681 Body mass index (BMI) 19 or less, adult: Secondary | ICD-10-CM | POA: Diagnosis not present

## 2019-05-05 DIAGNOSIS — T68XXXA Hypothermia, initial encounter: Secondary | ICD-10-CM | POA: Diagnosis not present

## 2019-05-05 DIAGNOSIS — F0280 Dementia in other diseases classified elsewhere without behavioral disturbance: Secondary | ICD-10-CM | POA: Diagnosis not present

## 2019-05-05 DIAGNOSIS — E43 Unspecified severe protein-calorie malnutrition: Secondary | ICD-10-CM | POA: Diagnosis not present

## 2019-05-05 DIAGNOSIS — R32 Unspecified urinary incontinence: Secondary | ICD-10-CM | POA: Diagnosis not present

## 2019-05-05 DIAGNOSIS — F419 Anxiety disorder, unspecified: Secondary | ICD-10-CM | POA: Diagnosis not present

## 2019-05-05 DIAGNOSIS — R001 Bradycardia, unspecified: Secondary | ICD-10-CM | POA: Diagnosis not present

## 2019-05-05 DIAGNOSIS — E871 Hypo-osmolality and hyponatremia: Secondary | ICD-10-CM | POA: Diagnosis not present

## 2019-05-05 DIAGNOSIS — Z20822 Contact with and (suspected) exposure to covid-19: Secondary | ICD-10-CM | POA: Diagnosis not present

## 2019-05-05 DIAGNOSIS — A419 Sepsis, unspecified organism: Secondary | ICD-10-CM | POA: Diagnosis not present

## 2019-05-05 DIAGNOSIS — R918 Other nonspecific abnormal finding of lung field: Secondary | ICD-10-CM | POA: Diagnosis not present

## 2019-05-05 DIAGNOSIS — R569 Unspecified convulsions: Secondary | ICD-10-CM | POA: Diagnosis not present

## 2019-05-05 DIAGNOSIS — F79 Unspecified intellectual disabilities: Secondary | ICD-10-CM | POA: Diagnosis not present

## 2019-05-05 DIAGNOSIS — R0602 Shortness of breath: Secondary | ICD-10-CM | POA: Diagnosis not present

## 2019-05-09 ENCOUNTER — Other Ambulatory Visit: Payer: Self-pay

## 2019-05-09 NOTE — Patient Outreach (Signed)
[facility]) Care Management  05/09/2019  [name] [birth year] [id number]   Referral Date:  05/09/19 Referral Source: [employer] Report Referral Reason: Patient discharged from [facility] 05/05/19   Telephone call to Dr. Josephine Cables office as patient does not have active number.  Spoke with Mateo Flow.  She states that patient is no longer active with Dr. Legrand Rams as patient is in long term residential facility.      Plan: RN CM will close case.     Jone Baseman, RN, MSN [facility] Care Management Care Management Coordinator Direct Line [phone] Toll Free: [phone]  Fax: [phone]

## 2019-05-18 DIAGNOSIS — R4182 Altered mental status, unspecified: Secondary | ICD-10-CM | POA: Diagnosis not present

## 2019-05-18 DIAGNOSIS — J690 Pneumonitis due to inhalation of food and vomit: Secondary | ICD-10-CM | POA: Diagnosis not present

## 2019-05-18 DIAGNOSIS — I10 Essential (primary) hypertension: Secondary | ICD-10-CM | POA: Diagnosis not present

## 2019-05-18 DIAGNOSIS — R569 Unspecified convulsions: Secondary | ICD-10-CM | POA: Diagnosis not present

## 2019-05-18 DIAGNOSIS — E43 Unspecified severe protein-calorie malnutrition: Secondary | ICD-10-CM | POA: Diagnosis not present

## 2019-05-18 DIAGNOSIS — G20 Parkinson's disease: Secondary | ICD-10-CM | POA: Diagnosis not present

## 2019-05-23 DIAGNOSIS — I10 Essential (primary) hypertension: Secondary | ICD-10-CM | POA: Diagnosis not present

## 2019-05-23 DIAGNOSIS — R4182 Altered mental status, unspecified: Secondary | ICD-10-CM | POA: Diagnosis not present

## 2019-05-23 DIAGNOSIS — G20 Parkinson's disease: Secondary | ICD-10-CM | POA: Diagnosis not present

## 2019-05-23 DIAGNOSIS — J690 Pneumonitis due to inhalation of food and vomit: Secondary | ICD-10-CM | POA: Diagnosis not present

## 2019-05-23 DIAGNOSIS — R569 Unspecified convulsions: Secondary | ICD-10-CM | POA: Diagnosis not present

## 2019-05-23 DIAGNOSIS — E43 Unspecified severe protein-calorie malnutrition: Secondary | ICD-10-CM | POA: Diagnosis not present

## 2019-05-24 DIAGNOSIS — L89153 Pressure ulcer of sacral region, stage 3: Secondary | ICD-10-CM | POA: Diagnosis not present

## 2019-08-25 DEATH — deceased

## 2020-01-15 IMAGING — CR DG CHEST 1V PORT
2 series · 2 of 2 positions shown · non-contrast
Comparison: 11/23/2018

CLINICAL DATA: Chronic swelling in the legs.

EXAM:
PORTABLE CHEST 1 VIEW

[portable (1 of 2)]
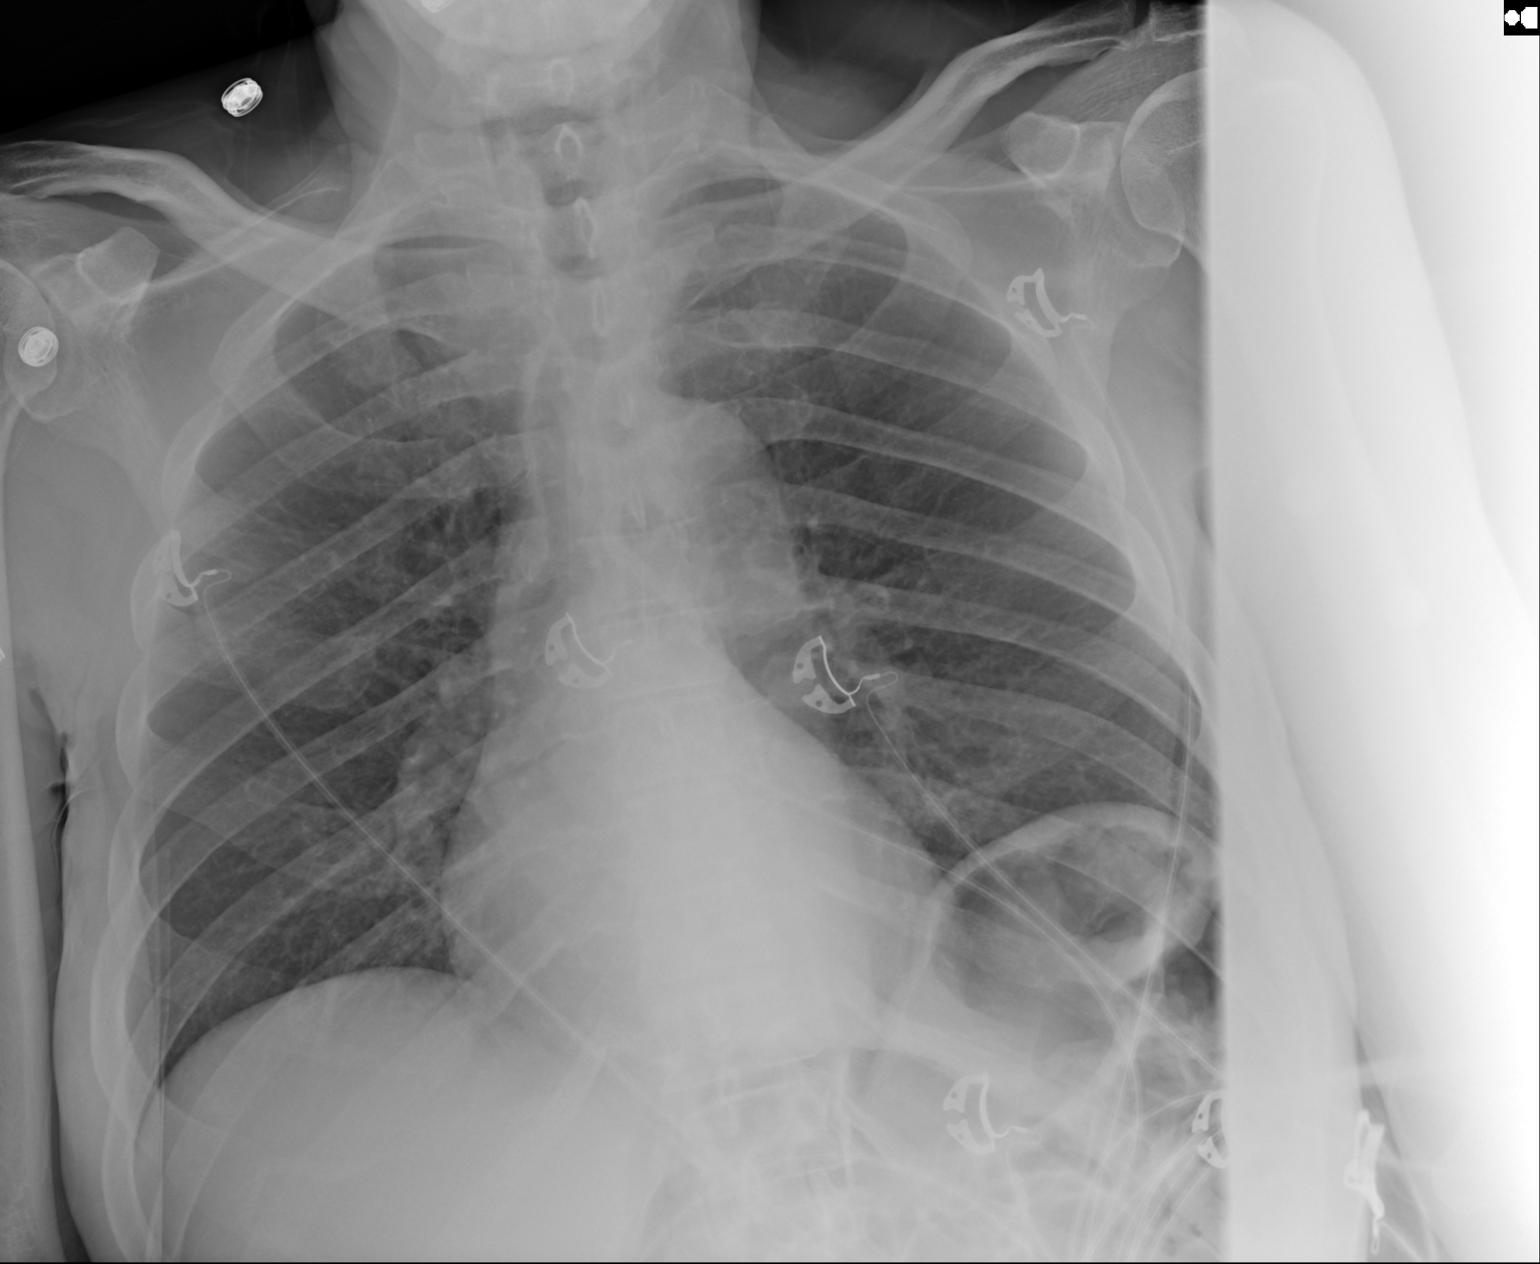

[portable (2 of 2)]
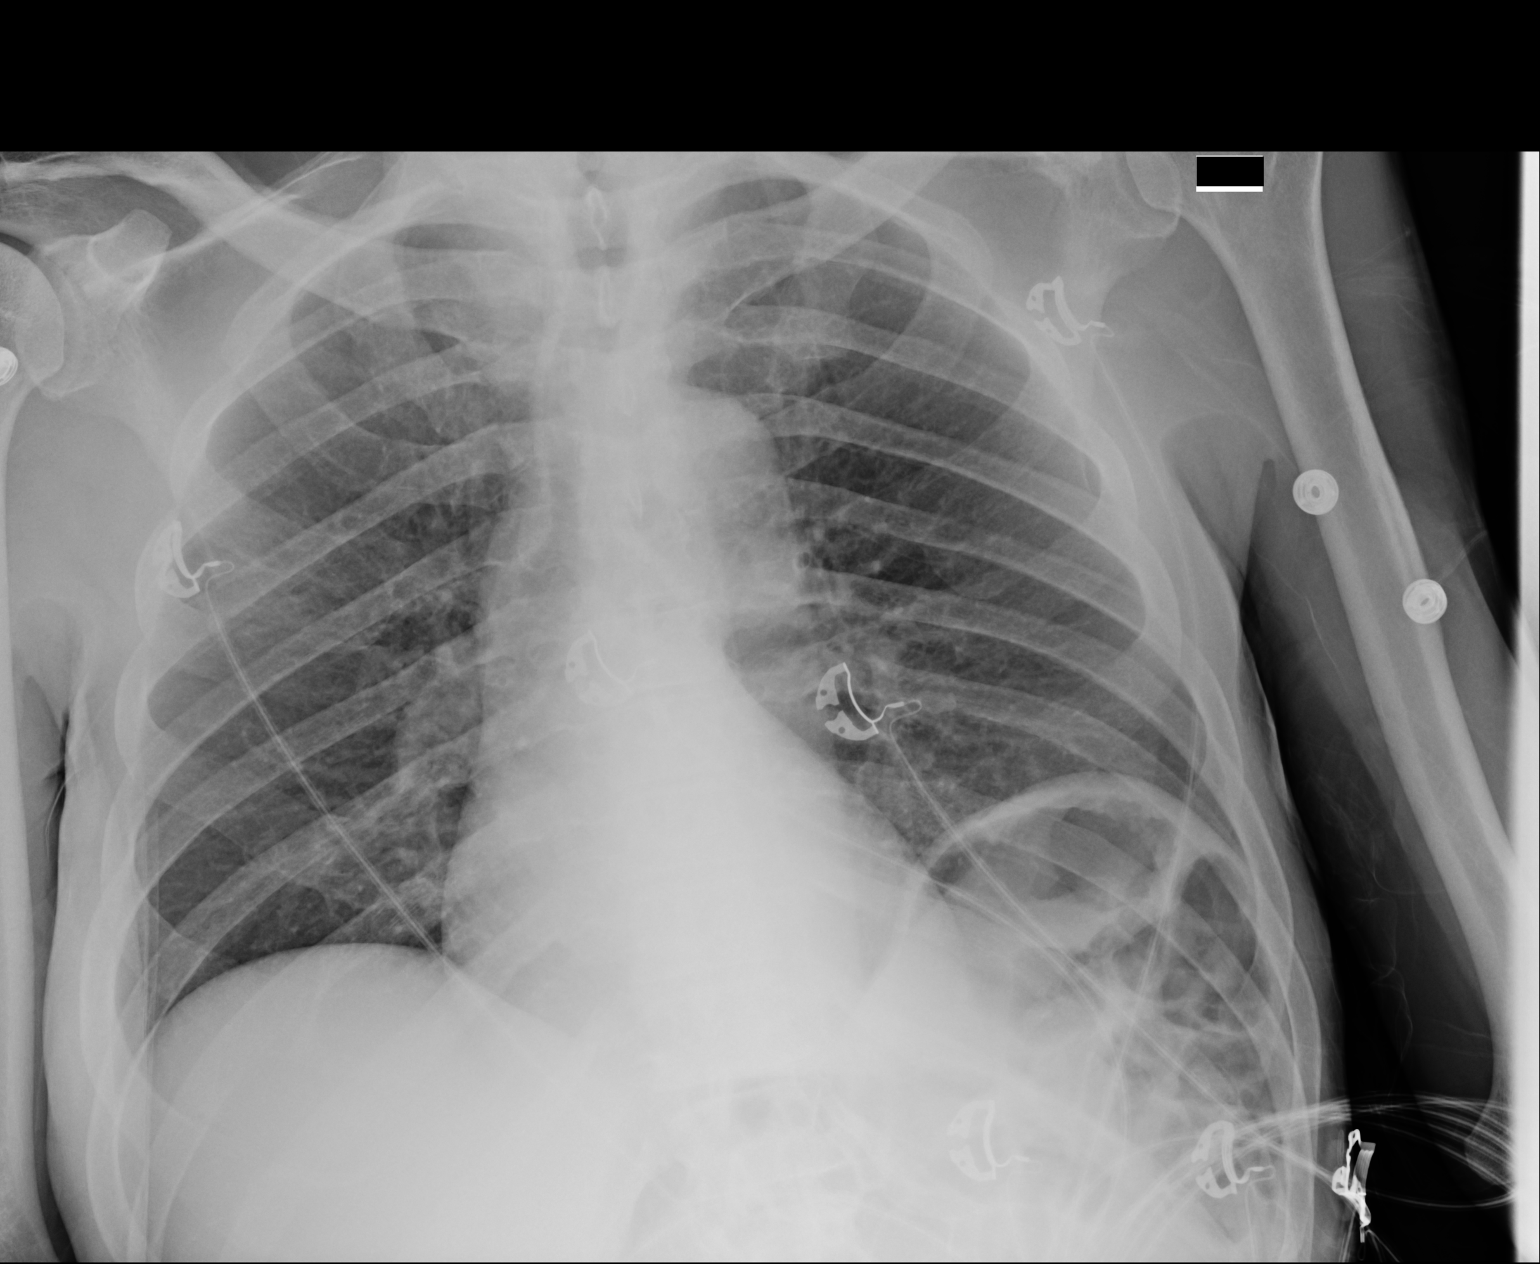

[2 of 2 positions shown; findings below may reference images not displayed]

FINDINGS: The heart size and mediastinal contours are within normal limits.
Both lungs are clear. The visualized skeletal structures are
unremarkable.
IMPRESSION: No active disease.

## 2020-01-15 IMAGING — NM NM PULMONARY PERF PARTICULATE
8 series · 8 of 8 positions shown · non-contrast
Comparison: Same day chest radiograph

CLINICAL DATA: PE suspected

EXAM:
NUCLEAR MEDICINE PERFUSION LUNG SCAN
TECHNIQUE: Perfusion images were obtained in multiple projections after
intravenous injection of radiopharmaceutical.
Ventilation scans intentionally deferred if perfusion scan and chest
x-ray adequate for interpretation during COVID 19 epidemic.
RADIOPHARMACEUTICALS:  1.6 mCi 1c-XXm MAA IV

[Series 1: ant/post perf · 4.14mm/px · 1 of 1 slices shown (1 of 2)]
[im 1/1]
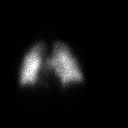

[Series 1: ant/post perf · 4.14mm/px · 1 of 1 slices shown (2 of 2)]
[im 1/1]
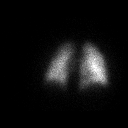

[Series 2: lao/rpo perf · 4.14mm/px · 1 of 1 slices shown (1 of 2)]
[im 1/1]
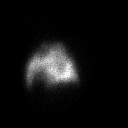

[Series 2: lao/rpo perf · 4.14mm/px · 1 of 1 slices shown (2 of 2)]
[im 1/1]
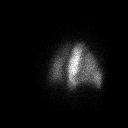

[Series 3: lt lat/rt lat perf · 4.14mm/px · 1 of 1 slices shown (1 of 2)]
[im 1/1]
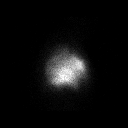

[Series 3: lt lat/rt lat perf · 4.14mm/px · 1 of 1 slices shown (2 of 2)]
[im 1/1]
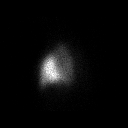

[Series 4: lpo/rao perf · 4.14mm/px · 1 of 1 slices shown (1 of 2)]
[im 1/1]
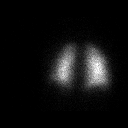

[Series 4: lpo/rao perf · 4.14mm/px · 1 of 1 slices shown (2 of 2)]
[im 1/1]
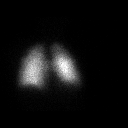

[8 of 8 positions shown; findings below may reference images not displayed]

FINDINGS: Normal, homogeneous pulmonary perfusion on diffusion only imaging.
No suspicious perfusion defect.
IMPRESSION: Normal pulmonary perfusion scan.  No evidence of pulmonary embolism.

## 2020-02-13 ENCOUNTER — Encounter: Payer: Self-pay | Admitting: Internal Medicine
